# Patient Record
Sex: Female | Born: 1940
Health system: Southern US, Community
[De-identification: ages and names within clinical notes are randomized; demographics above are authoritative.]

## PROBLEM LIST (undated history)

## (undated) DIAGNOSIS — R4701 Aphasia: Secondary | ICD-10-CM

## (undated) DIAGNOSIS — D890 Polyclonal hypergammaglobulinemia: Secondary | ICD-10-CM

## (undated) DIAGNOSIS — J3081 Allergic rhinitis due to animal (cat) (dog) hair and dander: Secondary | ICD-10-CM

## (undated) DIAGNOSIS — F419 Anxiety disorder, unspecified: Secondary | ICD-10-CM

## (undated) DIAGNOSIS — I679 Cerebrovascular disease, unspecified: Secondary | ICD-10-CM

## (undated) DIAGNOSIS — I639 Cerebral infarction, unspecified: Secondary | ICD-10-CM

## (undated) DIAGNOSIS — I6529 Occlusion and stenosis of unspecified carotid artery: Secondary | ICD-10-CM

## (undated) DIAGNOSIS — I10 Essential (primary) hypertension: Secondary | ICD-10-CM

## (undated) DIAGNOSIS — E782 Mixed hyperlipidemia: Secondary | ICD-10-CM

## (undated) DIAGNOSIS — F32A Depression, unspecified: Secondary | ICD-10-CM

## (undated) DIAGNOSIS — F329 Major depressive disorder, single episode, unspecified: Secondary | ICD-10-CM

## (undated) DIAGNOSIS — N183 Chronic kidney disease, stage 3 (moderate): Secondary | ICD-10-CM

## (undated) DIAGNOSIS — E119 Type 2 diabetes mellitus without complications: Secondary | ICD-10-CM

## (undated) DIAGNOSIS — N200 Calculus of kidney: Secondary | ICD-10-CM

## (undated) DIAGNOSIS — I739 Peripheral vascular disease, unspecified: Secondary | ICD-10-CM

## (undated) DIAGNOSIS — I2510 Atherosclerotic heart disease of native coronary artery without angina pectoris: Secondary | ICD-10-CM

## (undated) HISTORY — PX: KIDNEY STONE SURGERY: SHX686

## (undated) HISTORY — DX: Depression, unspecified: F32.A

## (undated) HISTORY — PX: CHOLECYSTECTOMY: SHX55

## (undated) HISTORY — PX: ABDOMINAL HYSTERECTOMY: SHX81

## (undated) HISTORY — DX: Allergic rhinitis due to animal (cat) (dog) hair and dander: J30.81

## (undated) HISTORY — DX: Aphasia: R47.01

## (undated) HISTORY — DX: Cerebrovascular disease, unspecified: I67.9

## (undated) HISTORY — DX: Peripheral vascular disease, unspecified: I73.9

## (undated) HISTORY — DX: Calculus of kidney: N20.0

## (undated) HISTORY — DX: Atherosclerotic heart disease of native coronary artery without angina pectoris: I25.10

## (undated) HISTORY — DX: Major depressive disorder, single episode, unspecified: F32.9

## (undated) HISTORY — DX: Mixed hyperlipidemia: E78.2

## (undated) HISTORY — DX: Cerebral infarction, unspecified: I63.9

## (undated) HISTORY — PX: TUBAL LIGATION: SHX77

## (undated) HISTORY — DX: Essential (primary) hypertension: I10

## (undated) HISTORY — DX: Anxiety disorder, unspecified: F41.9

## (undated) HISTORY — PX: APPENDECTOMY: SHX54

## (undated) HISTORY — PX: OTHER SURGICAL HISTORY: SHX169

## (undated) HISTORY — DX: Polyclonal hypergammaglobulinemia: D89.0

## (undated) HISTORY — DX: Occlusion and stenosis of unspecified carotid artery: I65.29

## (undated) HISTORY — PX: CATARACT EXTRACTION: SUR2

---

## 1998-12-27 ENCOUNTER — Encounter: Payer: Self-pay | Admitting: Neurology

## 1998-12-27 ENCOUNTER — Inpatient Hospital Stay (HOSPITAL_COMMUNITY): Admission: EM | Admit: 1998-12-27 | Discharge: 1999-01-01 | Payer: Self-pay | Admitting: Emergency Medicine

## 1998-12-28 ENCOUNTER — Encounter: Payer: Self-pay | Admitting: Internal Medicine

## 1998-12-29 ENCOUNTER — Encounter: Payer: Self-pay | Admitting: Internal Medicine

## 1998-12-30 ENCOUNTER — Encounter: Payer: Self-pay | Admitting: Internal Medicine

## 2002-01-20 ENCOUNTER — Ambulatory Visit (HOSPITAL_COMMUNITY): Admission: RE | Admit: 2002-01-20 | Discharge: 2002-01-20 | Payer: Self-pay | Admitting: Family Medicine

## 2002-01-20 ENCOUNTER — Encounter: Payer: Self-pay | Admitting: Family Medicine

## 2002-01-24 ENCOUNTER — Ambulatory Visit (HOSPITAL_COMMUNITY): Admission: RE | Admit: 2002-01-24 | Discharge: 2002-01-24 | Payer: Self-pay | Admitting: Family Medicine

## 2002-01-24 ENCOUNTER — Encounter: Payer: Self-pay | Admitting: Family Medicine

## 2002-03-19 ENCOUNTER — Inpatient Hospital Stay (HOSPITAL_COMMUNITY): Admission: EM | Admit: 2002-03-19 | Discharge: 2002-03-25 | Payer: Self-pay | Admitting: *Deleted

## 2002-03-19 ENCOUNTER — Encounter: Payer: Self-pay | Admitting: *Deleted

## 2002-03-21 ENCOUNTER — Encounter: Payer: Self-pay | Admitting: Family Medicine

## 2003-06-27 ENCOUNTER — Inpatient Hospital Stay (HOSPITAL_COMMUNITY): Admission: AD | Admit: 2003-06-27 | Discharge: 2003-07-02 | Payer: Self-pay | Admitting: Family Medicine

## 2003-06-27 ENCOUNTER — Ambulatory Visit (HOSPITAL_COMMUNITY): Admission: RE | Admit: 2003-06-27 | Discharge: 2003-06-27 | Payer: Self-pay | Admitting: Family Medicine

## 2003-06-27 IMAGING — CT CT CHEST W/O CM
1 of 2 series · 15 of 31 positions shown, 19 images · non-contrast
Comparison: none

CLINICAL DATA: Dyspnea.
 CT OF THE CHEST WITHOUT CONTRAST
 Comparison chest radiograph of [DATE].
 Contiguous axial CT images were obtained from the lung apices through the bases.  No contrast was administered.  The patient is allergic to IV contrast. 
 Mediastinum appears unremarkable.  There is consolidation of the left lower lobe likely reflecting pneumonia.  It will be important to reevaluate this in order to make sure there is not a central obstructing process on follow-up exams.  There are some air bronchograms in the left lower lobe.  I do not see a significant shift of heart or mediastinal structures to the left to suggest that this is purely due to atelectasis although there is some volume loss in the left lower lobe.
 There is also some band-like atelectasis in the right middle lobe and in the posterior basal segment of the right lower lobe.  I doubt that there is actually a pleural effusion.  
 IMPRESSION
 1.  Volume loss and consolidation in the left lower lobe.  Cannot exclude a central obstructing process although this may simply be due to pneumonia.  Follow-up chest radiography to ensure clearance is recommended.  If this process does not clear despite appropriate therapy, then bronchoscopy may be warranted with attention to the left lower lobe.
 2.  There is also some subsegmental atelectasis at the right lung base.

[Series 6799: — · axial · 0.52mm/px · z∈[+1465,+1710]mm · 15 of 57 slices shown, 19 images]
[im 4/57  mediastinal]
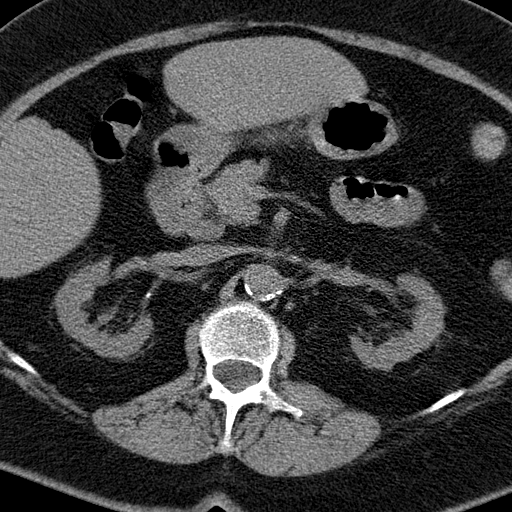
[im 4/57  lung]
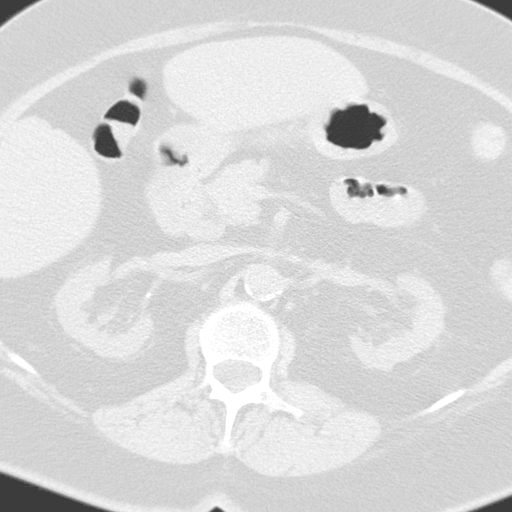
[im 8/57  lung]
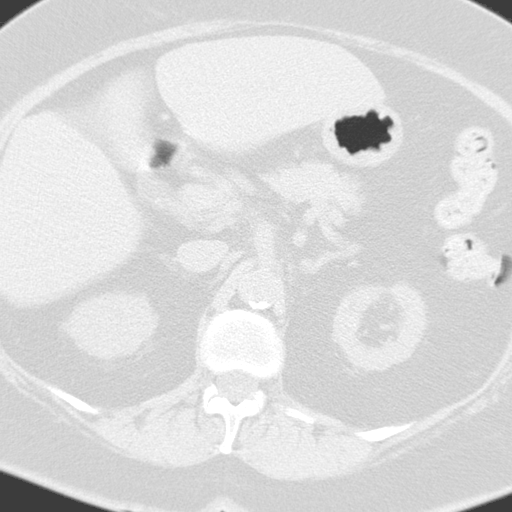
[im 15/57  lung]
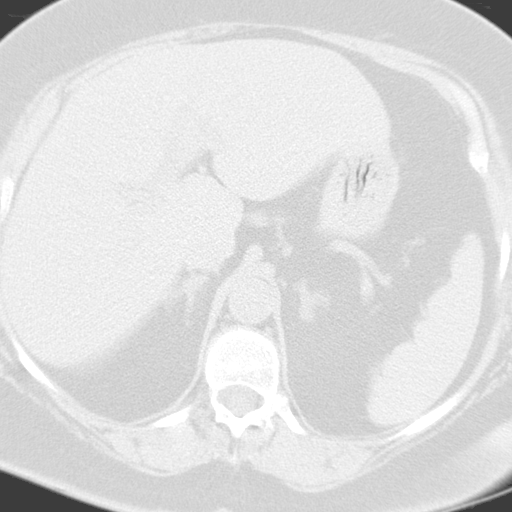
[im 18/57  lung]
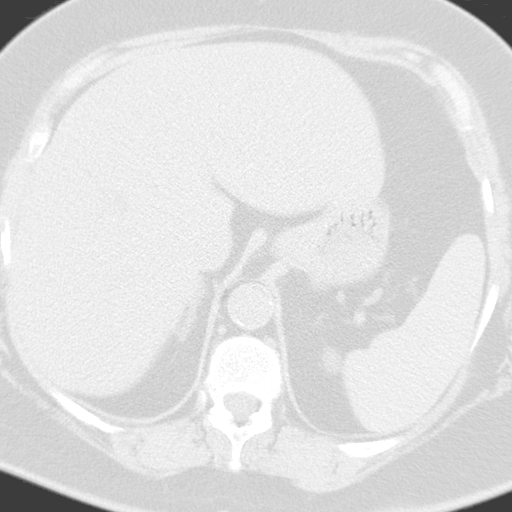
[im 19/57  mediastinal]
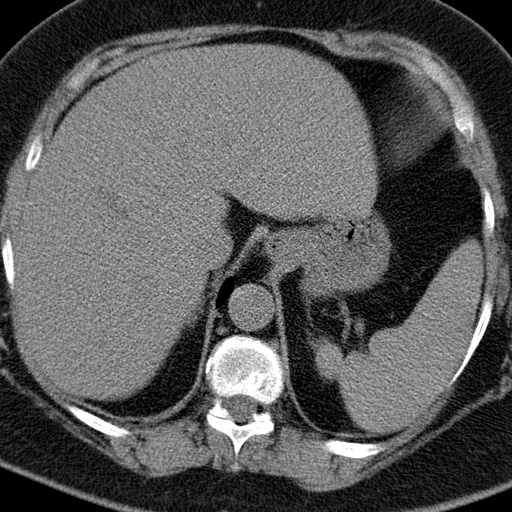
[im 19/57  lung]
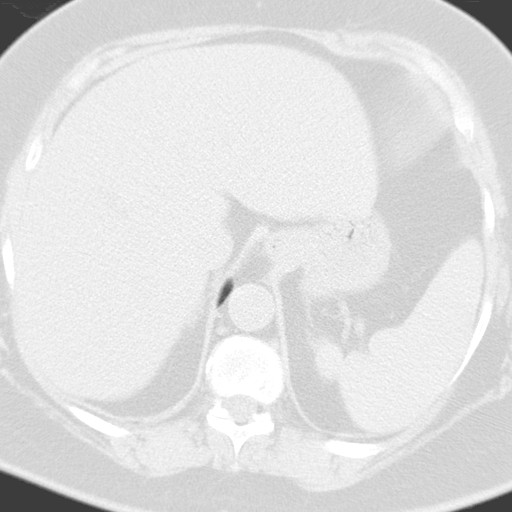
[im 22/57  lung]
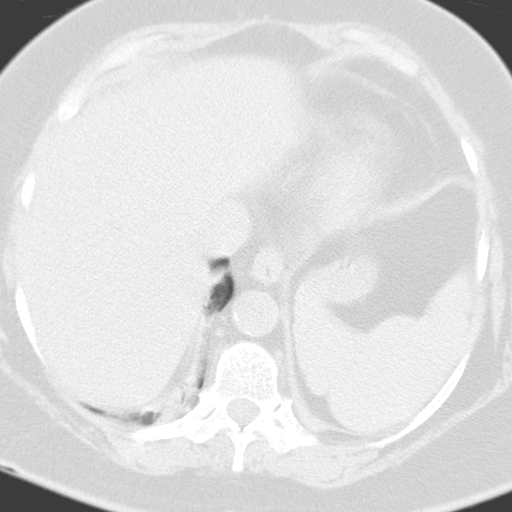
[im 25/57  lung]
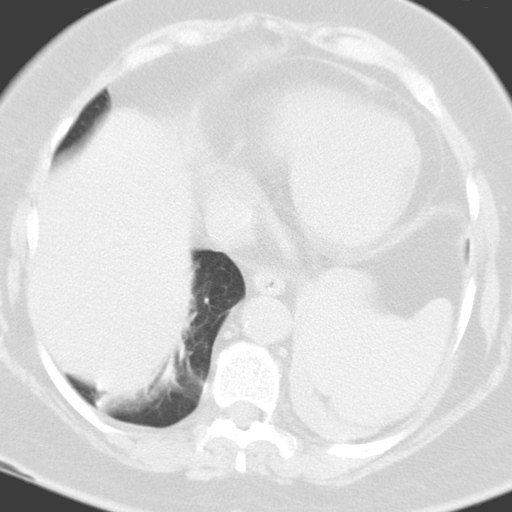
[im 29/57  lung]
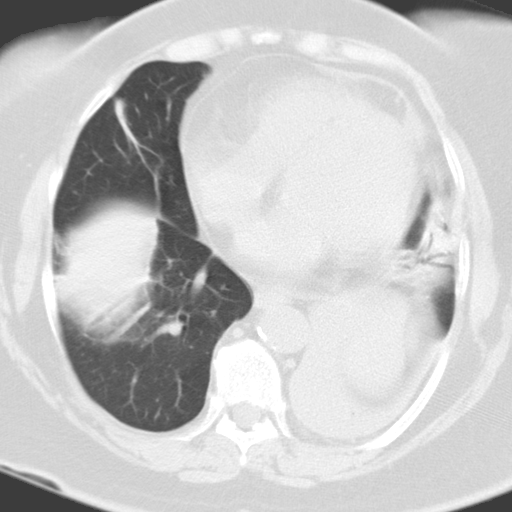
[im 32/57  mediastinal]
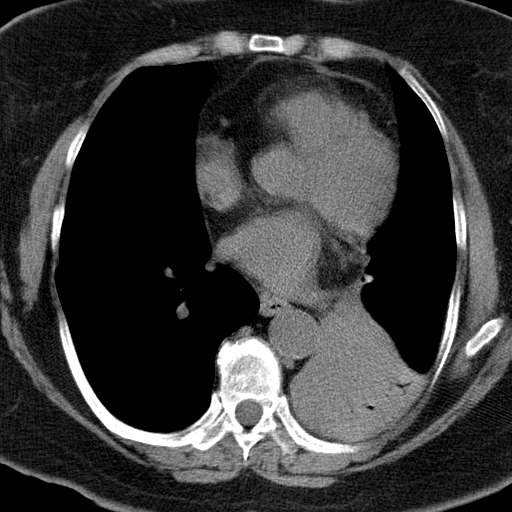
[im 32/57  lung]
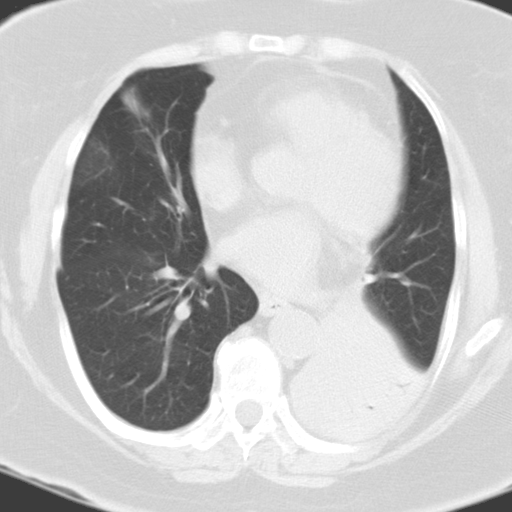
[im 36/57  lung]
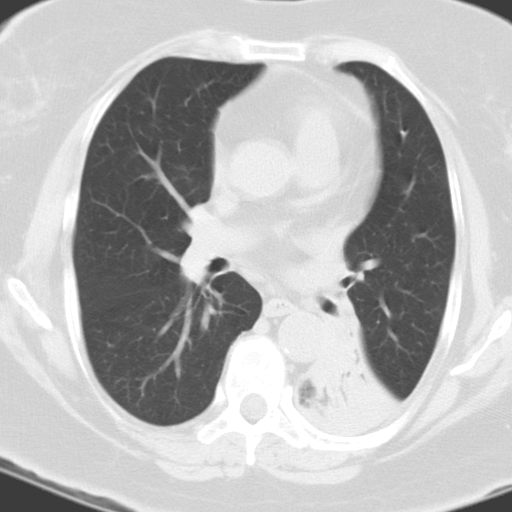
[im 38/57  lung]
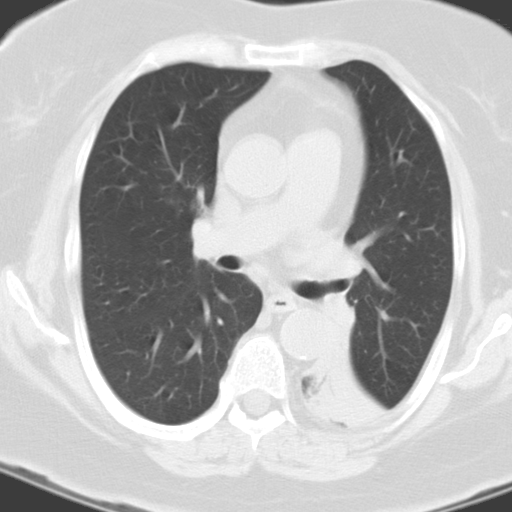
[im 39/57  lung]
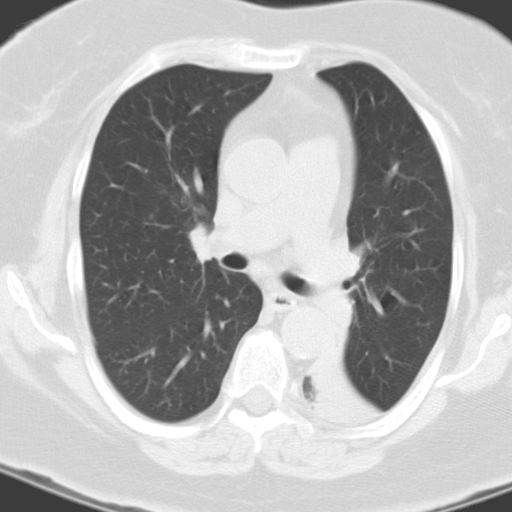
[im 46/57  mediastinal]
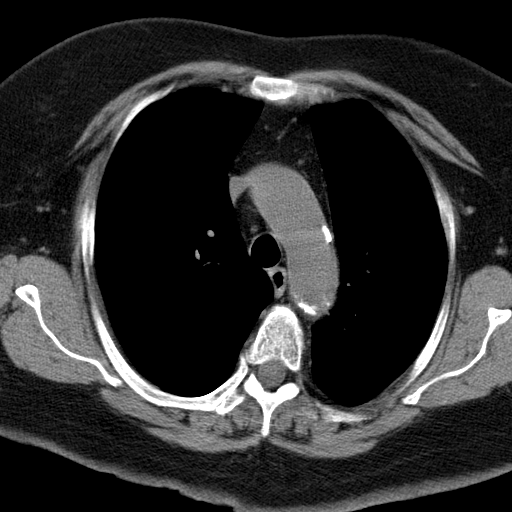
[im 46/57  lung]
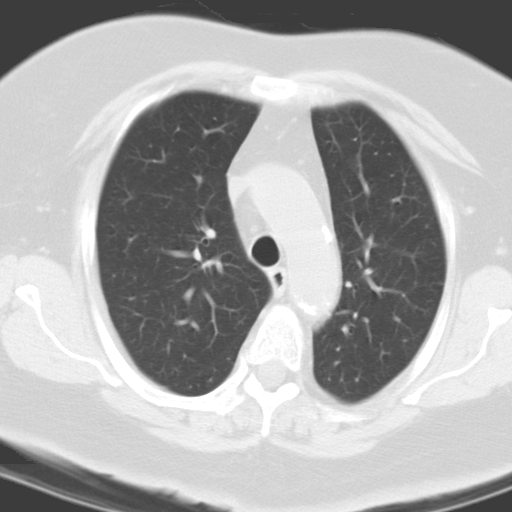
[im 50/57  lung]
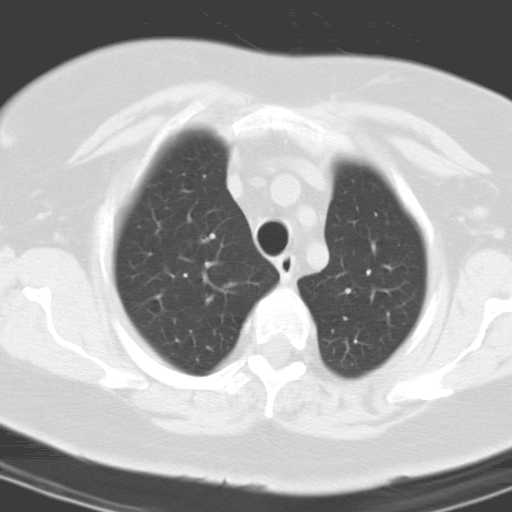
[im 53/57  lung]
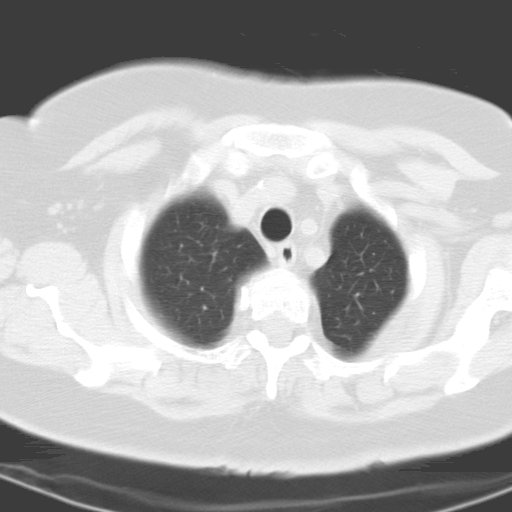

[15 of 31 positions shown; findings below may reference images not displayed]

## 2003-06-27 IMAGING — NM DG CHEST 2V
1 series · 2 of 2 positions shown · non-contrast
Comparison: none

CLINICAL DATA: Shortness of breath; fever
 CHEST TWO VIEWS
 Comparison [DATE].  The heart is mildly enlarged though stable.  The thoracic aorta is mildly atherosclerotic though unchanged.  The hilar and mediastinal contours are otherwise unremarkable.  There is consolidation in the left lower lobe.  Linear atelectasis is present in the right middle and lower lobes.  Degenerative changes are present throughout the thoracic spine.
 IMPRESSION
 Left lower lobe pneumonia.  Linear atelectasis in the right middle and right lower lobe.  Stable mild cardiomegaly.

[Series 1: — · 2 of 2 slices shown]
[im 1/2]
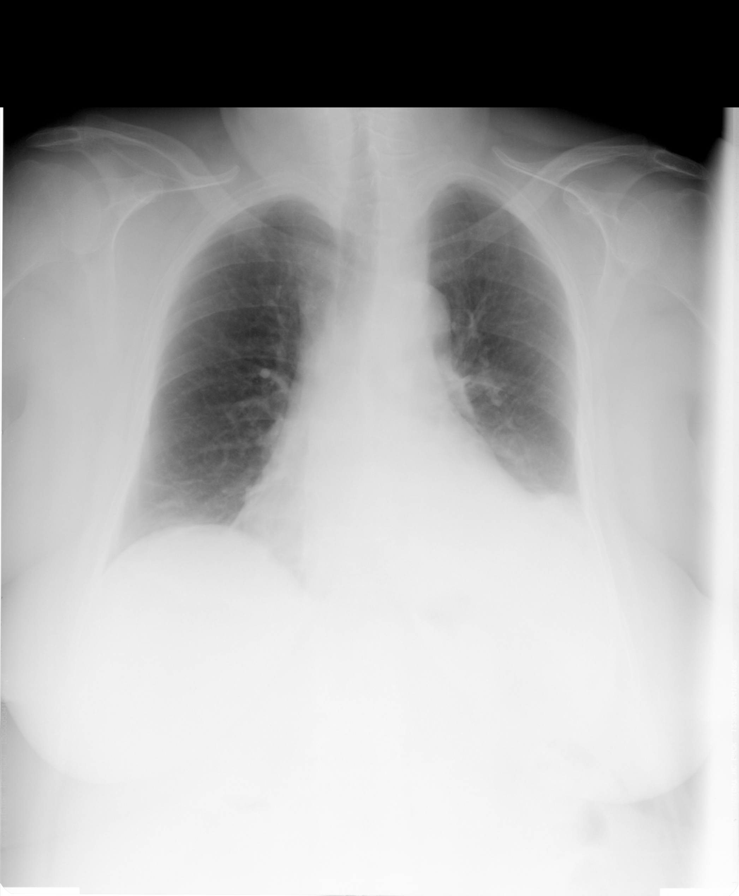
[im 2/2]
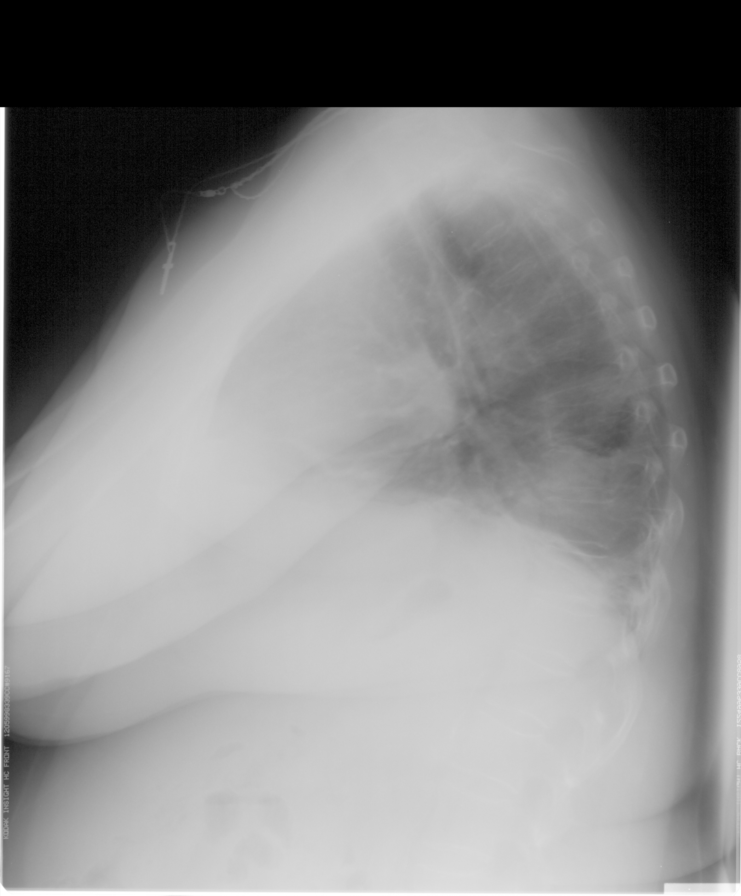

[2 of 2 positions shown; findings below may reference images not displayed]

## 2003-06-30 IMAGING — CR DG CHEST 2V
2 series · 2 of 2 positions shown · non-contrast
Comparison: none

CLINICAL DATA: Dyspnea, pleural effusion, bilateral basilar atelectasis.  Left lower lobe infiltrate.
 CHEST TWO VIEWS
 PA and lateral views of the chest are made on [DATE] at [HJ] hours and are compared to a previous study of [DATE] and previous CT scan of [DATE] and show again areas of atelectasis and/or infiltrate associated with the left base, which have changed little.  There is now slight improvement in aeration of the right lung base.  There remains mild cardiomegaly.  No definite pleural effusion is seen.  The bones show mild osteopenia.  The aorta is minimally elongated, dilated and calcified.  
 IMPRESSION
 Slight improvement in right lower lobe aeration.  There remains significant infiltrate and/or atelectasis at the left base and I would recommend follow-up until complete clearing and if clearing is not seen in three to four weeks, I would recommend a bronchoscopic examination to rule out an obstructing lesion of the left lower lobe bronchus.

[view not recorded (1 of 2)]
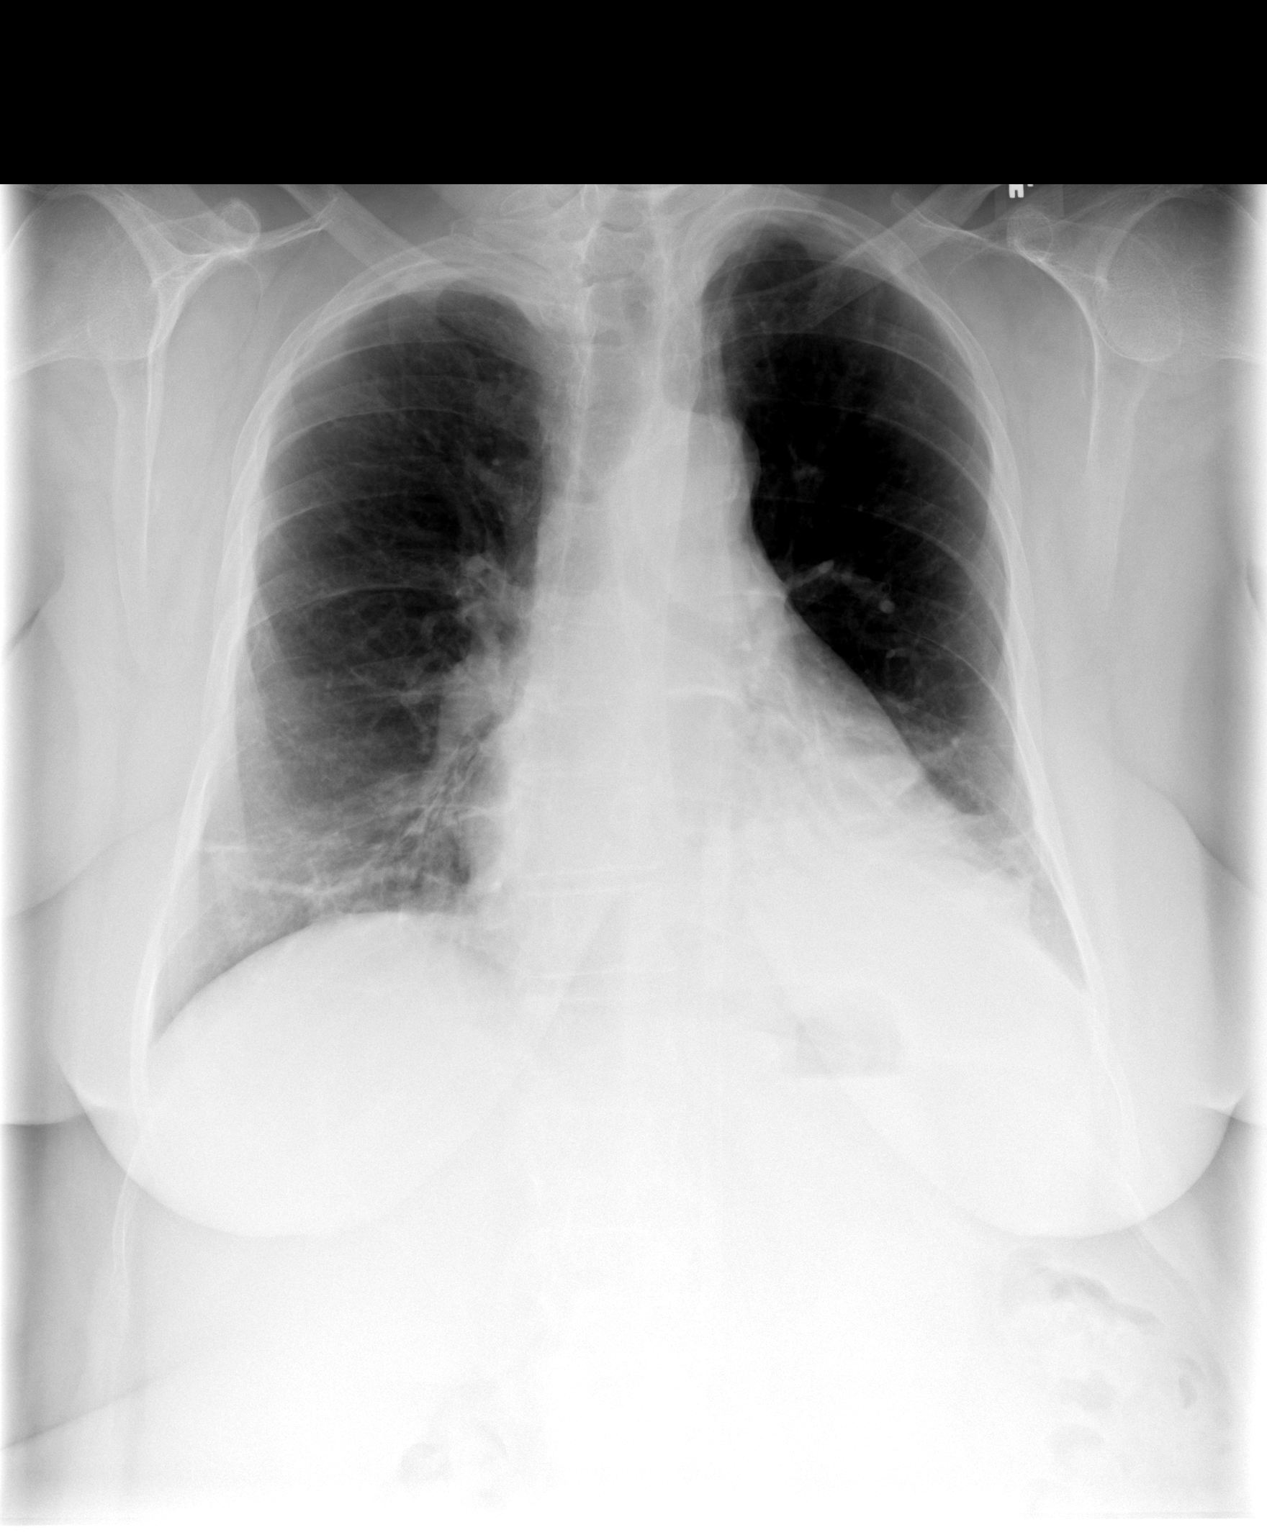

[view not recorded (2 of 2)]
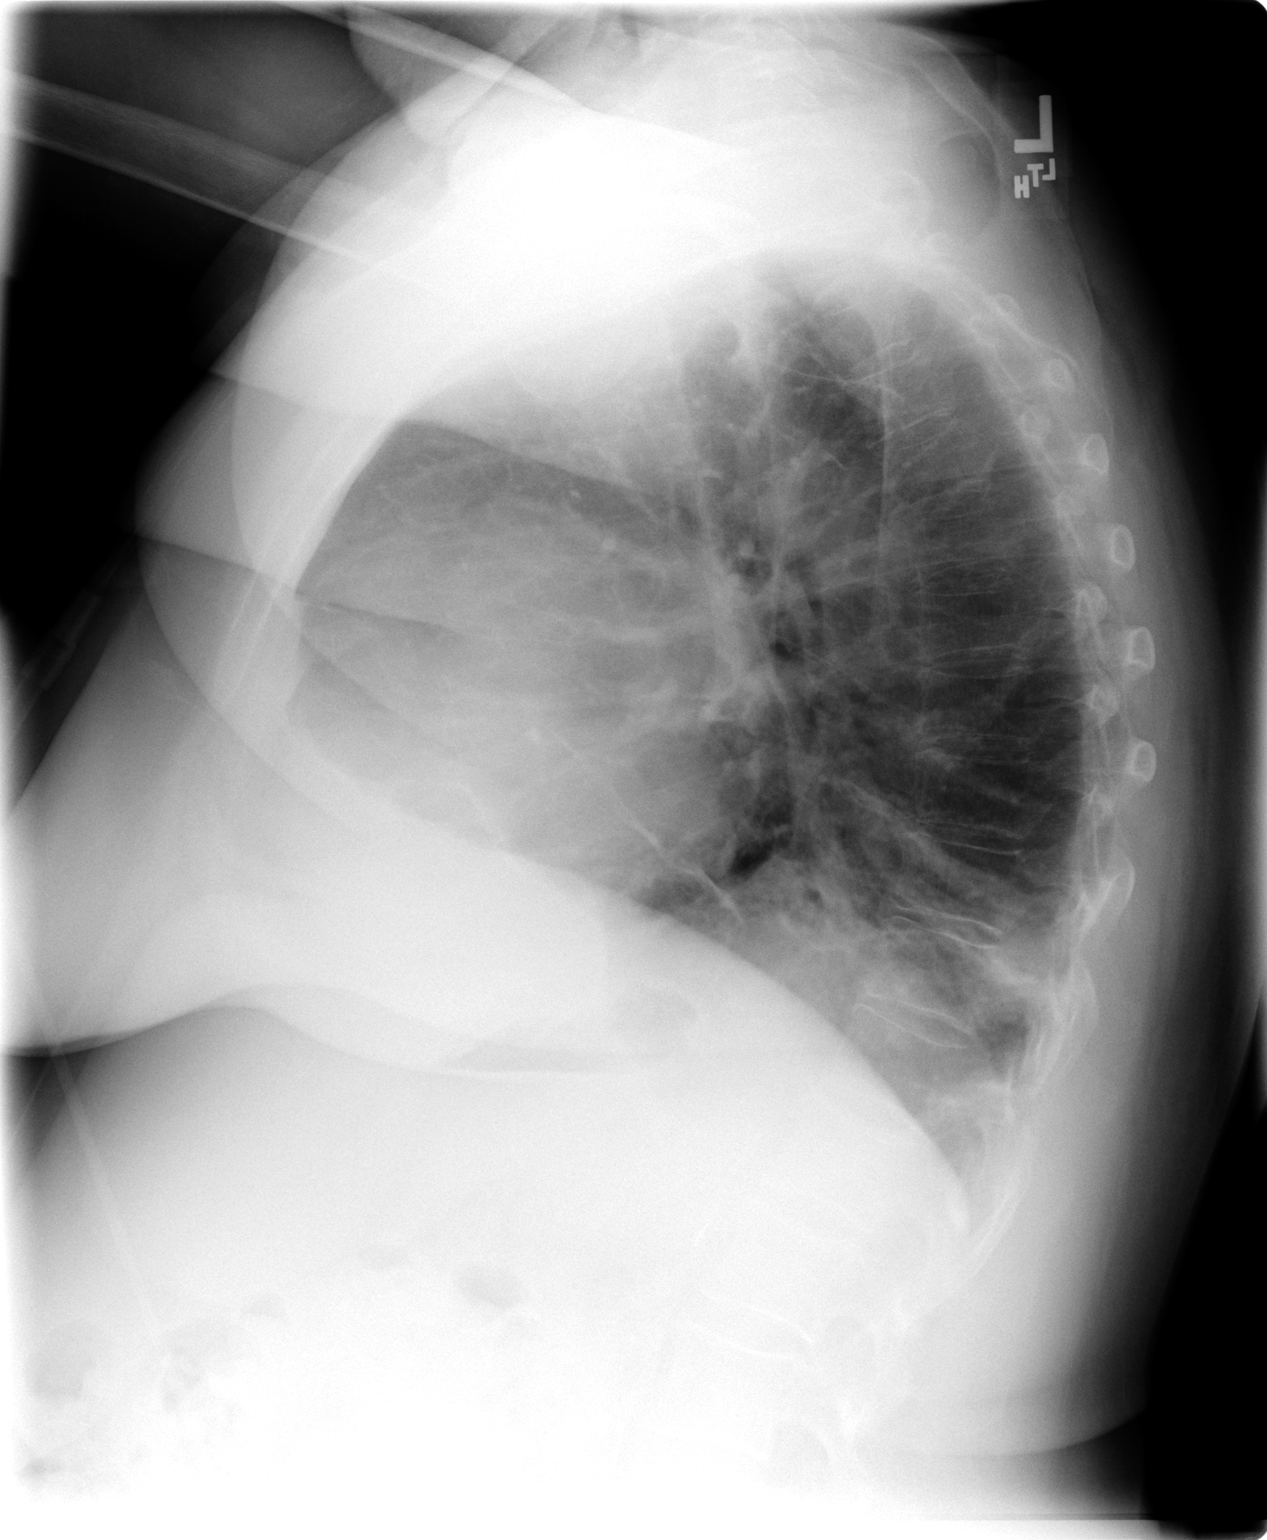

[2 of 2 positions shown; findings below may reference images not displayed]

## 2005-08-30 ENCOUNTER — Inpatient Hospital Stay (HOSPITAL_COMMUNITY): Admission: EM | Admit: 2005-08-30 | Discharge: 2005-09-03 | Payer: Self-pay | Admitting: *Deleted

## 2005-08-30 ENCOUNTER — Ambulatory Visit: Payer: Self-pay | Admitting: Internal Medicine

## 2005-08-30 IMAGING — CR DG CHEST 1V PORT
1 series · 1 of 1 positions shown · non-contrast
Comparison: [DATE].

CLINICAL DATA: Chest pain. 
 PORTABLE CHEST ? 1 VIEW ? [DATE] ([R5] HOURS):

[view not recorded]
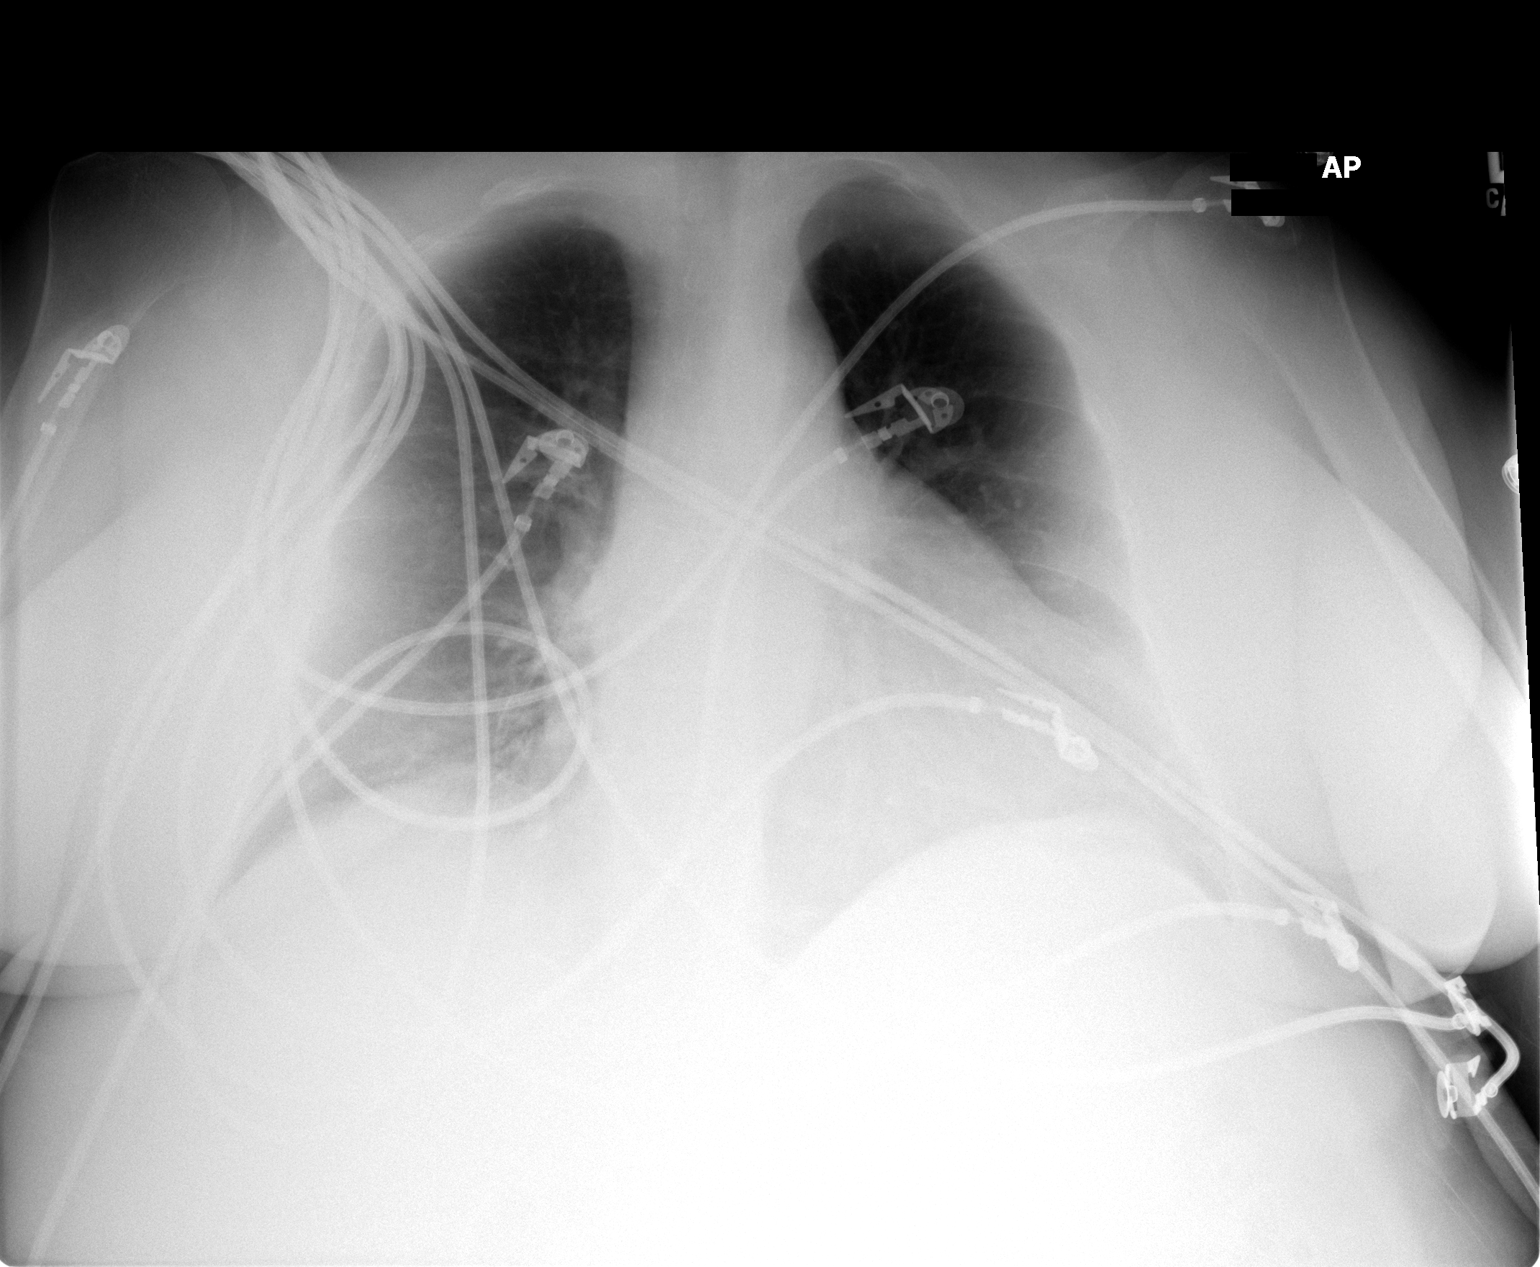

[1 of 1 positions shown; findings below may reference images not displayed]

Artifact overlies the chest.  The heart is at the upper limits of normal in size.     The vascularity is normal.   There is mild scarring at the lung bases.   No effusions.
IMPRESSION: No active disease.

## 2005-09-01 ENCOUNTER — Encounter: Payer: Self-pay | Admitting: Internal Medicine

## 2005-09-16 ENCOUNTER — Ambulatory Visit: Payer: Self-pay | Admitting: Cardiology

## 2005-10-17 ENCOUNTER — Ambulatory Visit: Payer: Self-pay | Admitting: Cardiology

## 2005-10-24 ENCOUNTER — Ambulatory Visit: Payer: Self-pay | Admitting: Cardiology

## 2005-10-28 ENCOUNTER — Ambulatory Visit: Payer: Self-pay

## 2005-11-12 ENCOUNTER — Ambulatory Visit (HOSPITAL_COMMUNITY): Admission: RE | Admit: 2005-11-12 | Discharge: 2005-11-12 | Payer: Self-pay | Admitting: Family Medicine

## 2005-11-25 ENCOUNTER — Ambulatory Visit: Payer: Self-pay | Admitting: Cardiology

## 2006-04-15 ENCOUNTER — Ambulatory Visit (HOSPITAL_COMMUNITY): Admission: RE | Admit: 2006-04-15 | Discharge: 2006-04-15 | Payer: Self-pay | Admitting: Ophthalmology

## 2006-05-06 ENCOUNTER — Ambulatory Visit: Payer: Self-pay | Admitting: Cardiology

## 2006-05-11 ENCOUNTER — Ambulatory Visit: Payer: Self-pay | Admitting: Cardiology

## 2006-05-11 LAB — CONVERTED CEMR LAB
CO2: 27 meq/L (ref 19–32)
Glomerular Filtration Rate, Af Am: 34 mL/min/{1.73_m2}
Glucose, Bld: 90 mg/dL (ref 70–99)
Potassium: 4.4 meq/L (ref 3.5–5.1)

## 2006-05-14 ENCOUNTER — Ambulatory Visit (HOSPITAL_COMMUNITY): Admission: RE | Admit: 2006-05-14 | Discharge: 2006-05-14 | Payer: Self-pay | Admitting: Ophthalmology

## 2006-11-04 ENCOUNTER — Ambulatory Visit: Payer: Self-pay | Admitting: Cardiology

## 2006-11-04 LAB — CONVERTED CEMR LAB
ALT: 22 units/L (ref 0–35)
Alkaline Phosphatase: 133 units/L — ABNORMAL HIGH (ref 39–117)
BUN: 20 mg/dL (ref 6–23)
CO2: 23 meq/L (ref 19–32)
Calcium: 9.9 mg/dL (ref 8.4–10.5)
Creatinine, Ser: 1.5 mg/dL — ABNORMAL HIGH (ref 0.4–1.2)
GFR calc Af Amer: 44 mL/min
Total Protein: 6.8 g/dL (ref 6.0–8.3)

## 2006-11-17 ENCOUNTER — Ambulatory Visit: Payer: Self-pay

## 2006-12-10 ENCOUNTER — Ambulatory Visit: Payer: Self-pay | Admitting: Cardiovascular Disease

## 2006-12-18 ENCOUNTER — Ambulatory Visit: Payer: Self-pay | Admitting: Cardiology

## 2006-12-29 ENCOUNTER — Ambulatory Visit: Payer: Self-pay | Admitting: Cardiology

## 2006-12-30 ENCOUNTER — Ambulatory Visit: Payer: Self-pay | Admitting: Cardiology

## 2006-12-30 ENCOUNTER — Inpatient Hospital Stay (HOSPITAL_COMMUNITY): Admission: AD | Admit: 2006-12-30 | Discharge: 2007-01-02 | Payer: Self-pay | Admitting: Cardiology

## 2006-12-30 IMAGING — CR DG CHEST 2V
2 series · 2 of 2 positions shown · non-contrast
Comparison: [DATE]

CLINICAL DATA: Chest pain. Dyspnea. Diabetic.

CHEST - 2 VIEW

[view not recorded (1 of 2)]
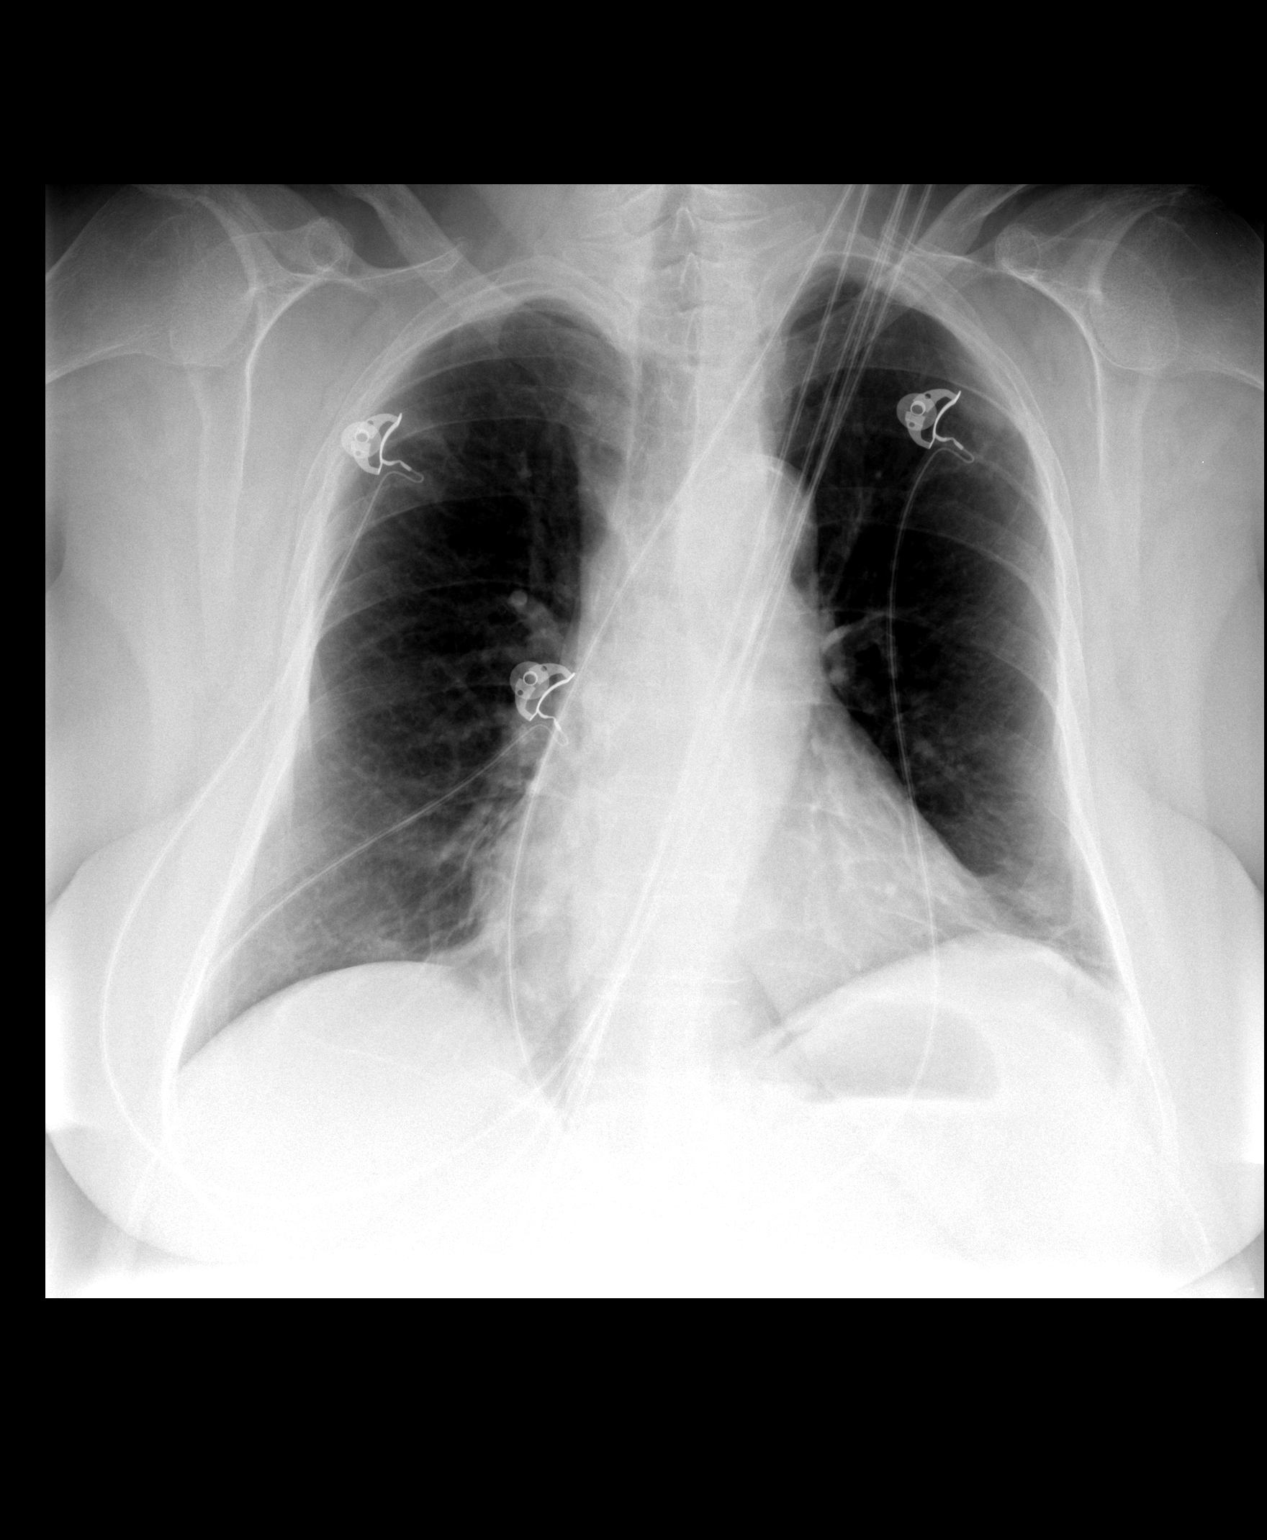

[view not recorded (2 of 2)]
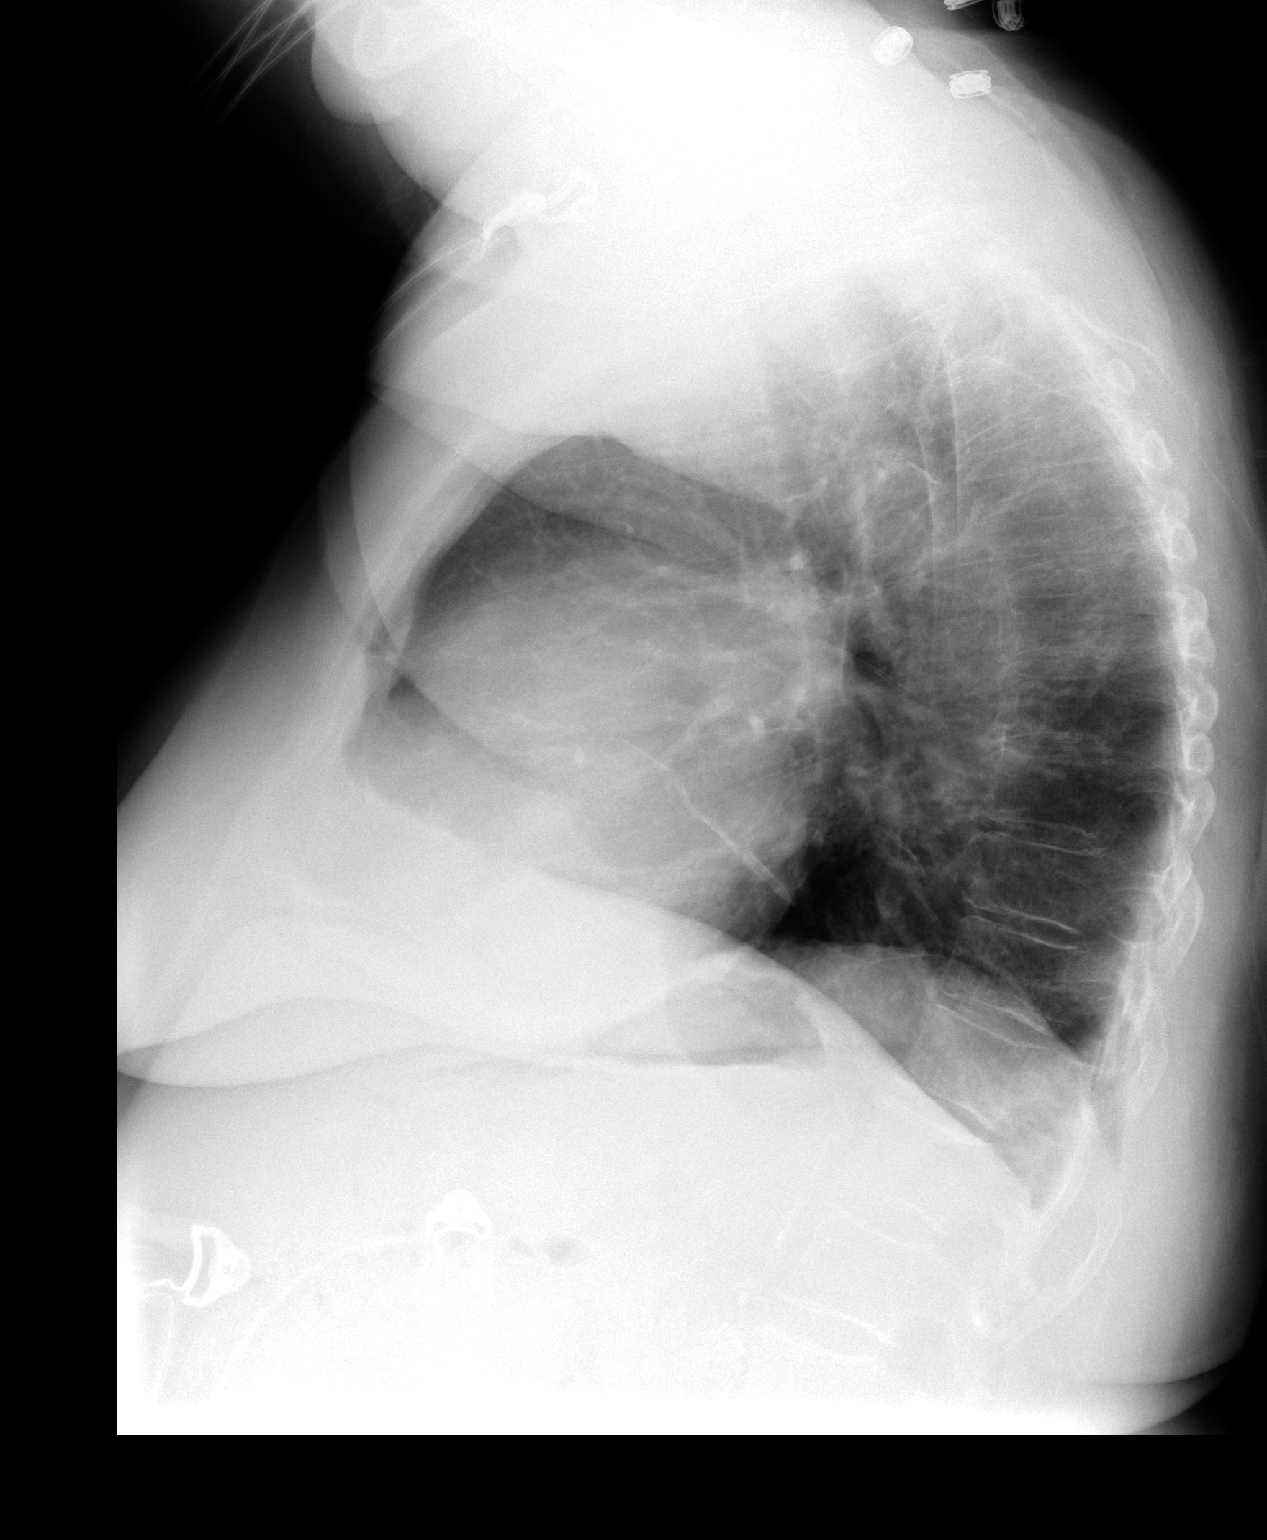

[2 of 2 positions shown; findings below may reference images not displayed]

FINDINGS: Mild osteopenia. Limited evaluation of upper thoracic vertebral
bodies on lateral view.

Midline trachea. Borderline cardiomegaly. Tortuous descending thoracic aorta.
Sharp costophrenic angles. No pneumothorax or congestive failure. Increased
density adjacent to EKG/telemetry leads are felt to be artifactual and part of
the lead apparatus. Minimal left base scar or atelectasis.

IMPRESSION

1. Borderline cardiomegaly without acute cardiopulmonary disease.
2. Left base scar or atelectasis.

## 2007-01-04 ENCOUNTER — Ambulatory Visit: Payer: Self-pay | Admitting: Cardiology

## 2007-01-04 LAB — CONVERTED CEMR LAB
CO2: 29 meq/L (ref 19–32)
Calcium: 9.1 mg/dL (ref 8.4–10.5)
GFR calc Af Amer: 48 mL/min
Glucose, Bld: 134 mg/dL — ABNORMAL HIGH (ref 70–99)

## 2007-02-09 ENCOUNTER — Ambulatory Visit: Payer: Self-pay | Admitting: Cardiology

## 2007-02-09 LAB — CONVERTED CEMR LAB
CO2: 27 meq/L (ref 19–32)
Chloride: 108 meq/L (ref 96–112)
Creatinine, Ser: 1.4 mg/dL — ABNORMAL HIGH (ref 0.4–1.2)
GFR calc non Af Amer: 40 mL/min
Glucose, Bld: 162 mg/dL — ABNORMAL HIGH (ref 70–99)
Sodium: 144 meq/L (ref 135–145)

## 2007-09-08 ENCOUNTER — Ambulatory Visit: Payer: Self-pay | Admitting: Cardiology

## 2007-09-08 LAB — CONVERTED CEMR LAB
AST: 19 units/L (ref 0–37)
Alkaline Phosphatase: 132 units/L — ABNORMAL HIGH (ref 39–117)
Bilirubin, Direct: 0.1 mg/dL (ref 0.0–0.3)
Chloride: 108 meq/L (ref 96–112)
GFR calc non Af Amer: 37 mL/min
Potassium: 4.3 meq/L (ref 3.5–5.1)
Sodium: 142 meq/L (ref 135–145)
Total Bilirubin: 1 mg/dL (ref 0.3–1.2)
Total CHOL/HDL Ratio: 3.9
VLDL: 53 mg/dL — ABNORMAL HIGH (ref 0–40)

## 2007-10-20 ENCOUNTER — Ambulatory Visit: Payer: Self-pay

## 2008-03-14 ENCOUNTER — Ambulatory Visit: Payer: Self-pay | Admitting: Cardiology

## 2008-03-14 LAB — CONVERTED CEMR LAB
Bilirubin, Direct: 0.1 mg/dL (ref 0.0–0.3)
Calcium: 9.6 mg/dL (ref 8.4–10.5)
Creatinine, Ser: 1.5 mg/dL — ABNORMAL HIGH (ref 0.4–1.2)
GFR calc Af Amer: 44 mL/min
HDL: 57.6 mg/dL (ref >39.0)
Sodium: 142 meq/L (ref 135–145)
Total Bilirubin: 0.9 mg/dL (ref 0.3–1.2)
Total CHOL/HDL Ratio: 2.7
Total Protein: 6.8 g/dL (ref 6.0–8.3)
Triglycerides: 170 mg/dL — ABNORMAL HIGH (ref 0–149)
VLDL: 34 mg/dL (ref 0–40)

## 2008-08-15 ENCOUNTER — Encounter: Payer: Self-pay | Admitting: Cardiology

## 2008-08-18 ENCOUNTER — Encounter (INDEPENDENT_AMBULATORY_CARE_PROVIDER_SITE_OTHER): Payer: Self-pay | Admitting: *Deleted

## 2008-08-27 ENCOUNTER — Emergency Department (HOSPITAL_COMMUNITY): Admission: EM | Admit: 2008-08-27 | Discharge: 2008-08-27 | Payer: Self-pay | Admitting: Emergency Medicine

## 2008-09-12 ENCOUNTER — Ambulatory Visit: Payer: Self-pay | Admitting: Internal Medicine

## 2008-09-12 DIAGNOSIS — K5900 Constipation, unspecified: Secondary | ICD-10-CM | POA: Insufficient documentation

## 2008-09-12 DIAGNOSIS — K649 Unspecified hemorrhoids: Secondary | ICD-10-CM | POA: Insufficient documentation

## 2008-09-12 DIAGNOSIS — K921 Melena: Secondary | ICD-10-CM | POA: Insufficient documentation

## 2008-09-13 ENCOUNTER — Encounter: Payer: Self-pay | Admitting: Internal Medicine

## 2008-09-19 DIAGNOSIS — I2510 Atherosclerotic heart disease of native coronary artery without angina pectoris: Secondary | ICD-10-CM | POA: Insufficient documentation

## 2008-09-19 DIAGNOSIS — I10 Essential (primary) hypertension: Secondary | ICD-10-CM | POA: Insufficient documentation

## 2008-09-19 DIAGNOSIS — E119 Type 2 diabetes mellitus without complications: Secondary | ICD-10-CM | POA: Insufficient documentation

## 2008-09-19 DIAGNOSIS — N259 Disorder resulting from impaired renal tubular function, unspecified: Secondary | ICD-10-CM | POA: Insufficient documentation

## 2008-09-19 DIAGNOSIS — I739 Peripheral vascular disease, unspecified: Secondary | ICD-10-CM | POA: Insufficient documentation

## 2008-09-19 DIAGNOSIS — E785 Hyperlipidemia, unspecified: Secondary | ICD-10-CM | POA: Insufficient documentation

## 2008-09-19 DIAGNOSIS — Z91041 Radiographic dye allergy status: Secondary | ICD-10-CM | POA: Insufficient documentation

## 2008-09-20 ENCOUNTER — Ambulatory Visit: Payer: Self-pay | Admitting: Cardiology

## 2008-09-20 ENCOUNTER — Ambulatory Visit: Payer: Self-pay

## 2008-09-20 DIAGNOSIS — R079 Chest pain, unspecified: Secondary | ICD-10-CM | POA: Insufficient documentation

## 2008-09-20 DIAGNOSIS — I6529 Occlusion and stenosis of unspecified carotid artery: Secondary | ICD-10-CM | POA: Insufficient documentation

## 2008-09-26 ENCOUNTER — Ambulatory Visit (HOSPITAL_COMMUNITY): Admission: RE | Admit: 2008-09-26 | Discharge: 2008-09-26 | Payer: Self-pay | Admitting: Internal Medicine

## 2008-09-26 ENCOUNTER — Encounter: Payer: Self-pay | Admitting: Internal Medicine

## 2008-09-26 ENCOUNTER — Ambulatory Visit: Payer: Self-pay | Admitting: Internal Medicine

## 2008-09-26 HISTORY — PX: COLONOSCOPY: SHX174

## 2008-09-27 ENCOUNTER — Encounter: Payer: Self-pay | Admitting: Internal Medicine

## 2008-09-27 ENCOUNTER — Encounter: Payer: Self-pay | Admitting: Cardiology

## 2009-03-19 ENCOUNTER — Emergency Department (HOSPITAL_COMMUNITY): Admission: EM | Admit: 2009-03-19 | Discharge: 2009-03-19 | Payer: Self-pay | Admitting: Emergency Medicine

## 2009-03-19 IMAGING — CR DG CHEST 2V
2 series · 2 of 2 positions shown · non-contrast
Comparison: [DATE]

CLINICAL DATA: Shortness of breath, lower extremity swelling,
weakness, former smoker, coronary disease status post MI, COPD

CHEST - 2 VIEW

[view not recorded (1 of 2)]
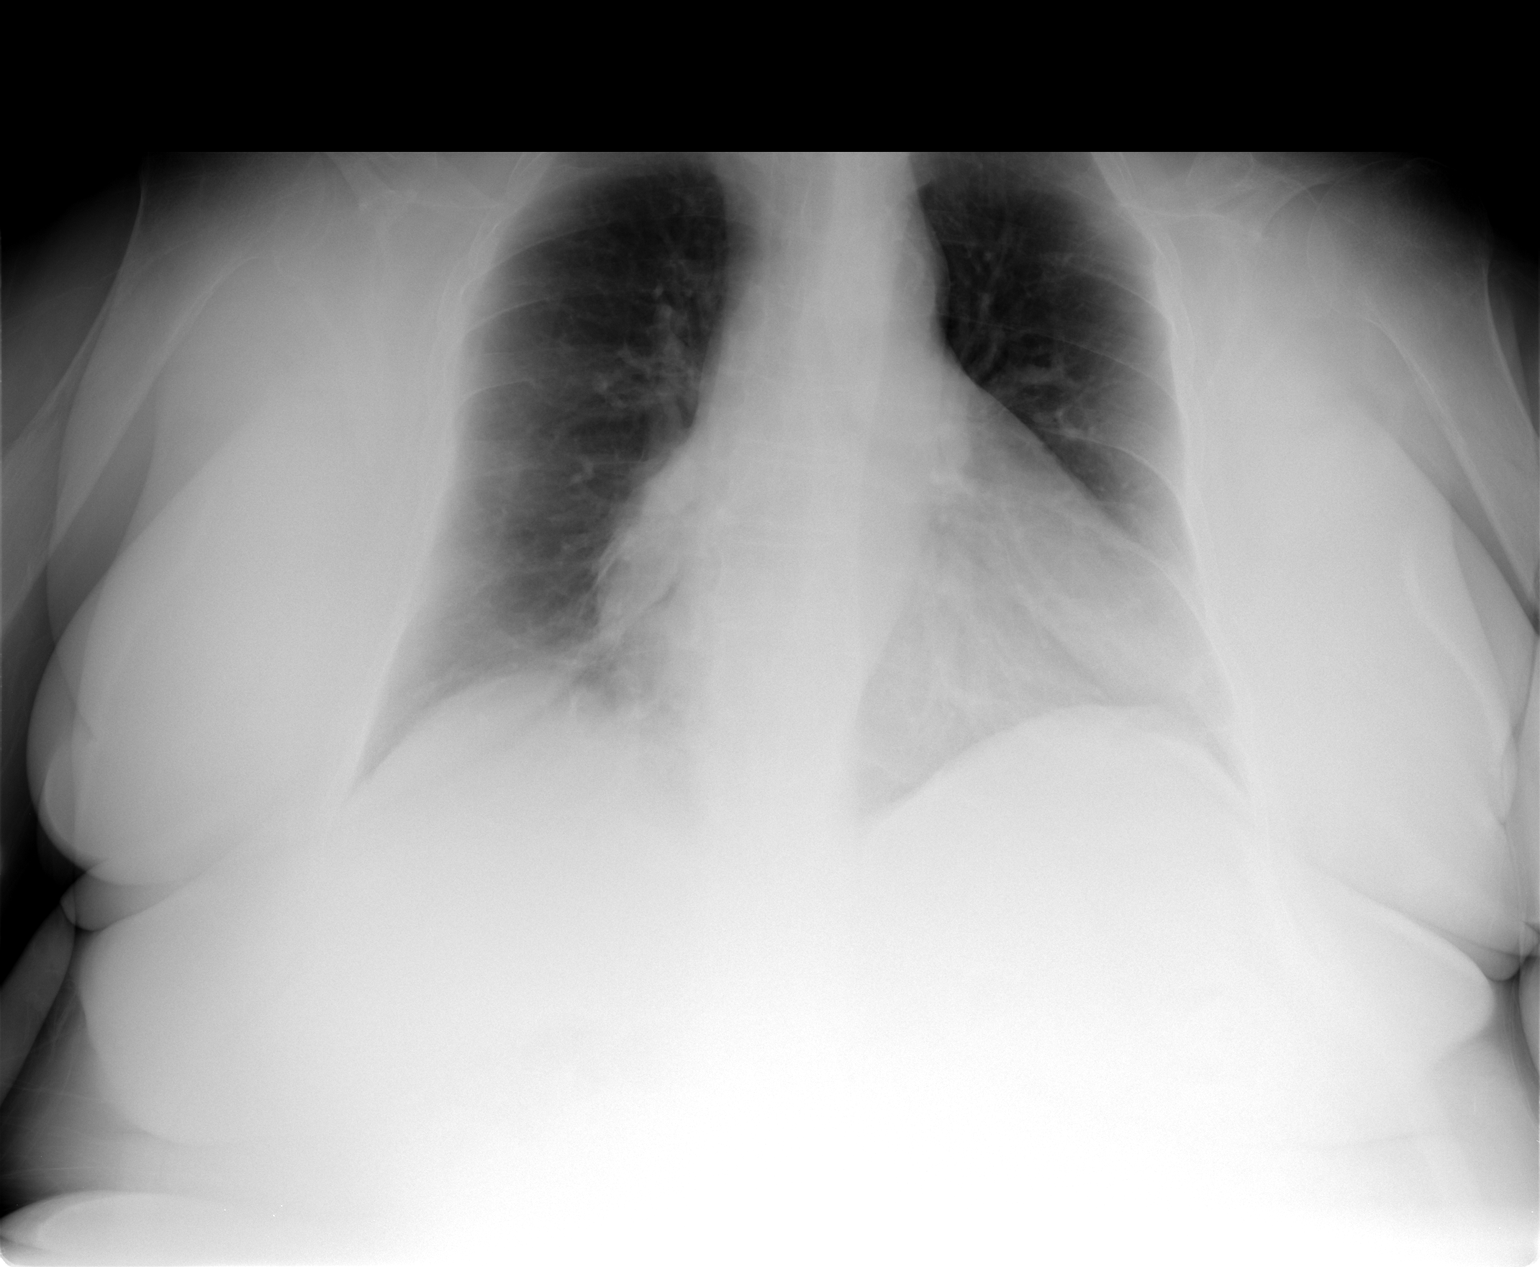

[view not recorded (2 of 2)]
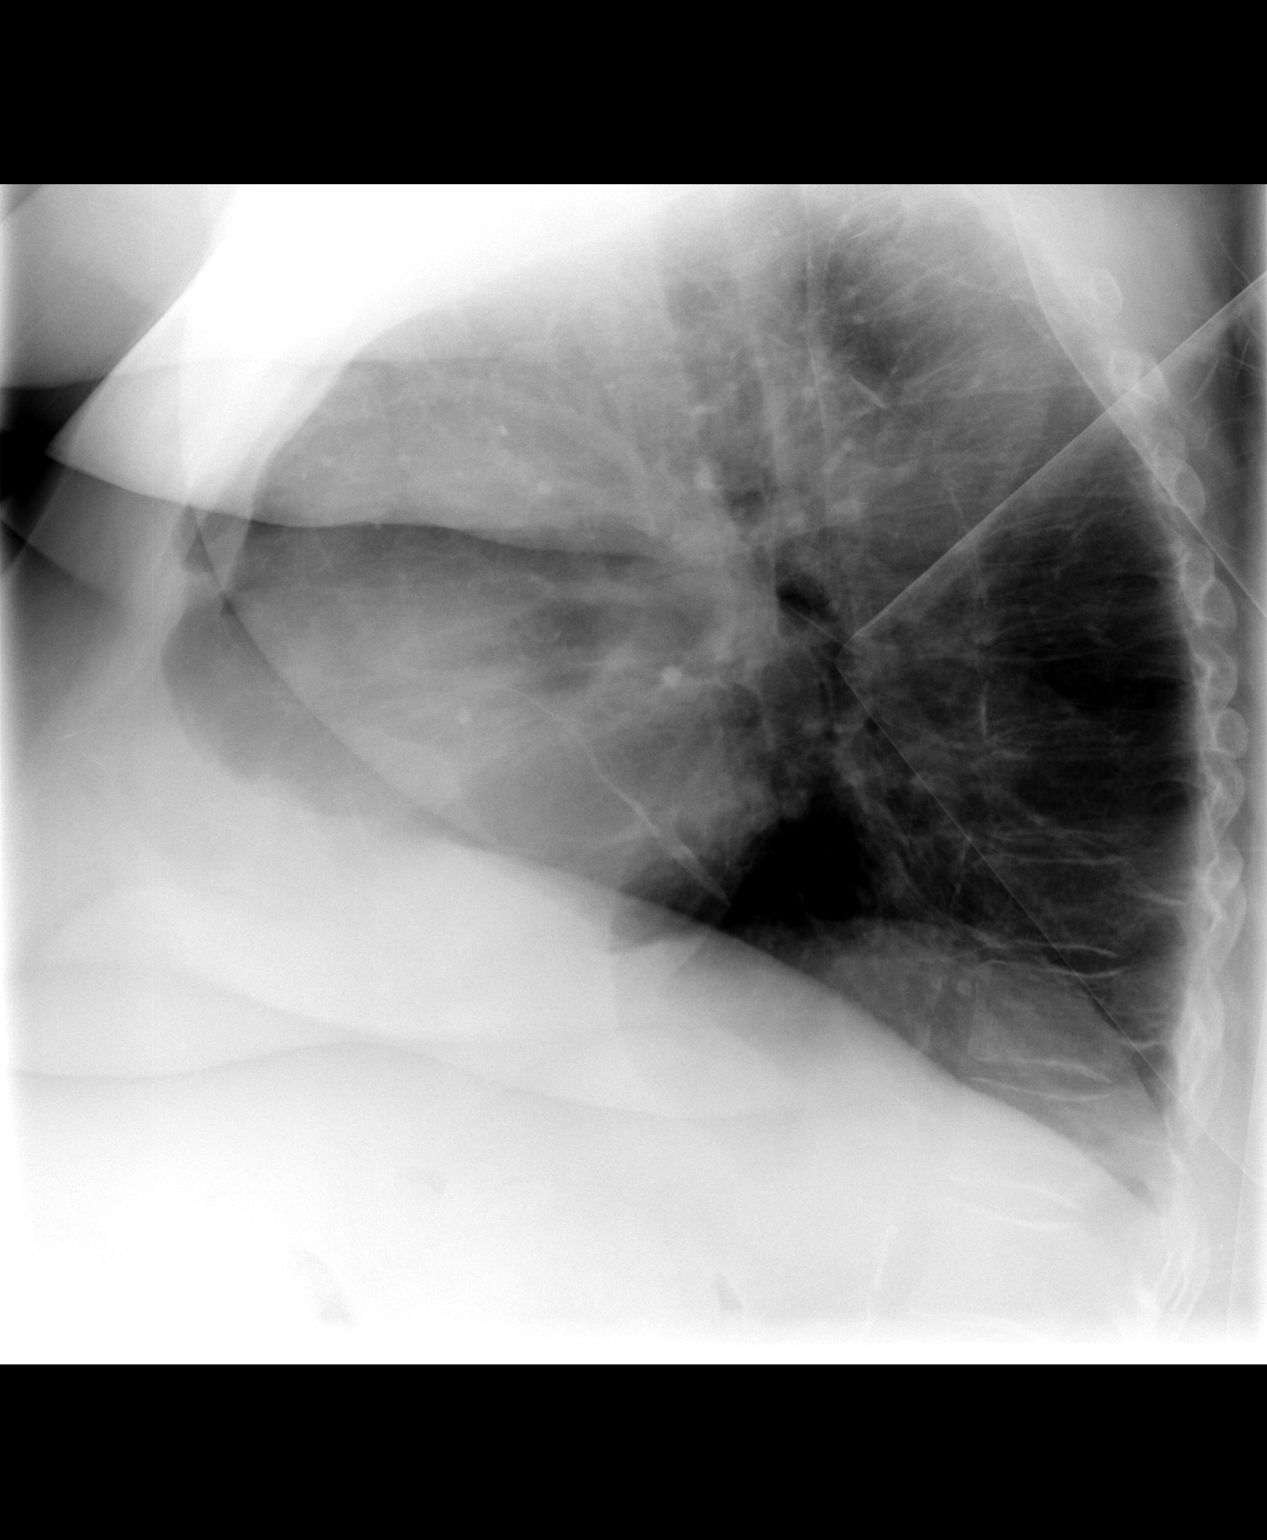

[2 of 2 positions shown; findings below may reference images not displayed]

FINDINGS: Cardiac enlargement.
Calcified tortuous aorta.
Pulmonary vascularity normal.
Lungs clear.
Bones demineralized.
IMPRESSION: Cardiomegaly.
No acute abnormalities.

## 2009-05-02 ENCOUNTER — Ambulatory Visit: Payer: Self-pay | Admitting: Cardiology

## 2009-11-29 ENCOUNTER — Inpatient Hospital Stay (HOSPITAL_COMMUNITY): Admission: EM | Admit: 2009-11-29 | Discharge: 2009-12-03 | Payer: Self-pay | Admitting: Emergency Medicine

## 2009-11-29 IMAGING — CT CT ABD-PELV W/O CM
2 of 4 series · 16 of 46 positions shown, 18 images · non-contrast
Comparison: Lumbar spine radiograph performed [DATE]

CLINICAL DATA: Right-sided abdominal pain; history of renal stones.

CT ABDOMEN AND PELVIS WITHOUT CONTRAST
TECHNIQUE: Multidetector CT imaging of the abdomen and pelvis was
performed following the standard protocol without intravenous
contrast.

[Series 2: standard/full over (age)lbs 5.0 · axial · 0.80mm/px · z∈[-490,-105]mm · 13 of 85 slices shown, 15 images]
[im 4/85  soft-tissue]
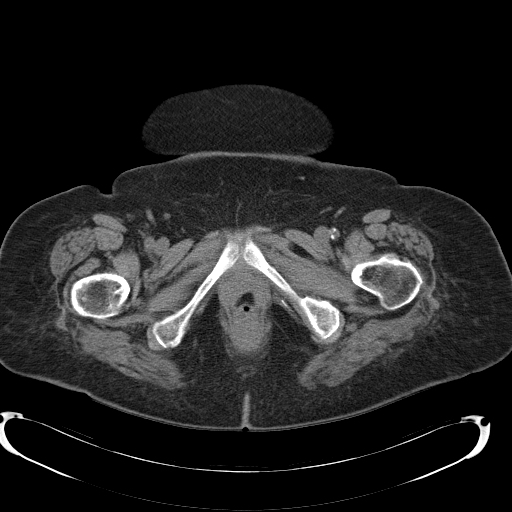
[im 4/85  bone]
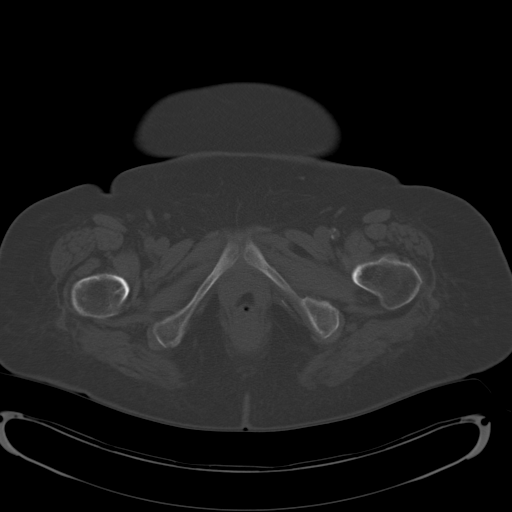
[im 11/85  soft-tissue]
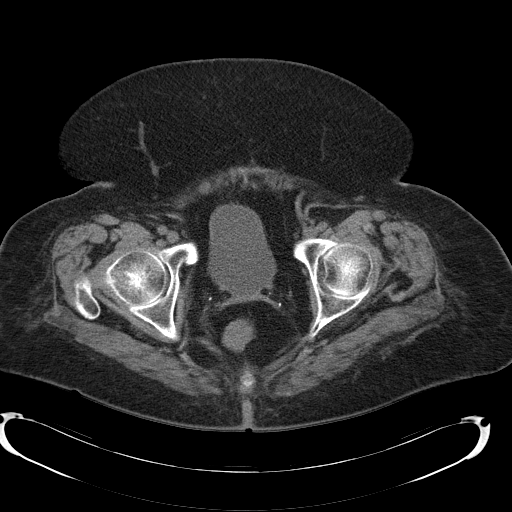
[im 17/85  soft-tissue]
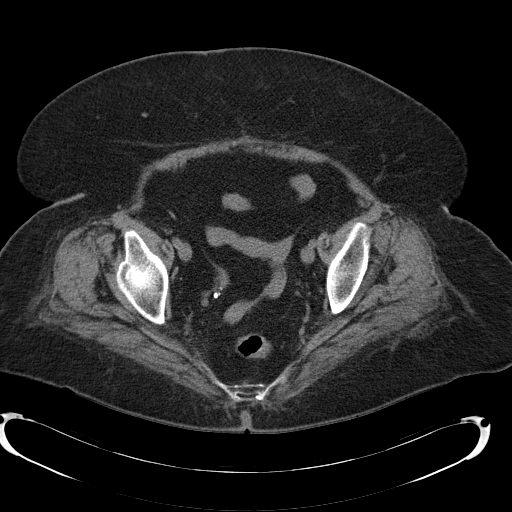
[im 24/85  soft-tissue]
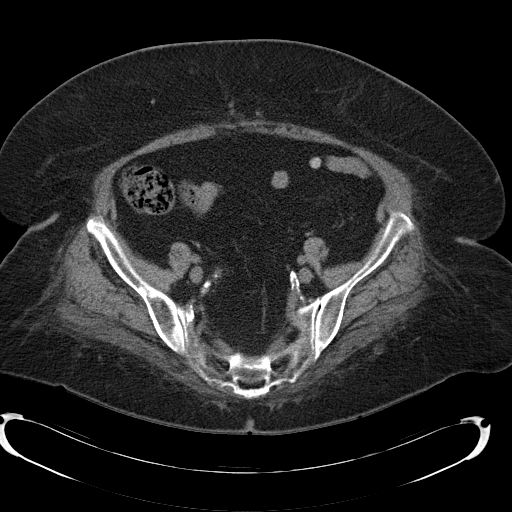
[im 31/85  soft-tissue]
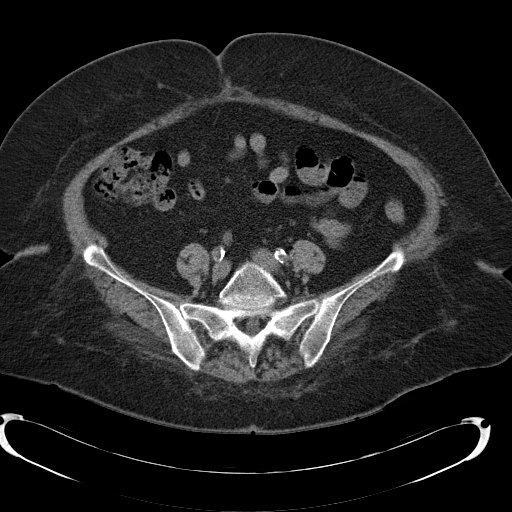
[im 37/85  soft-tissue]
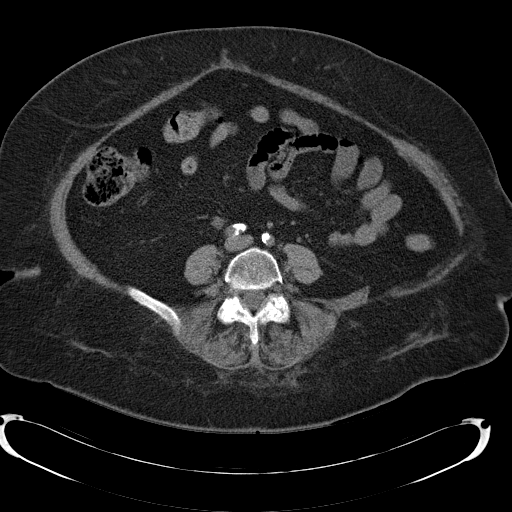
[im 44/85  soft-tissue]
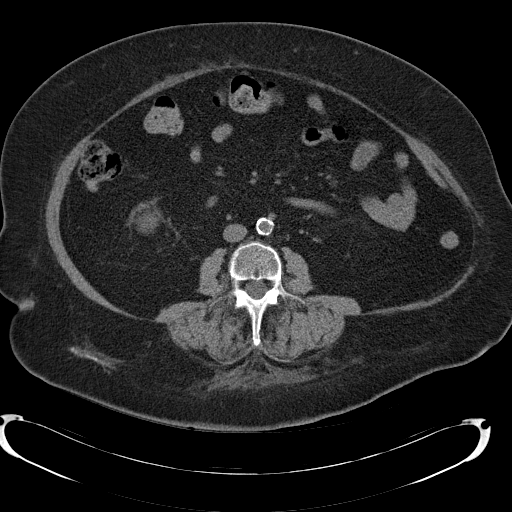
[im 48/85  soft-tissue]
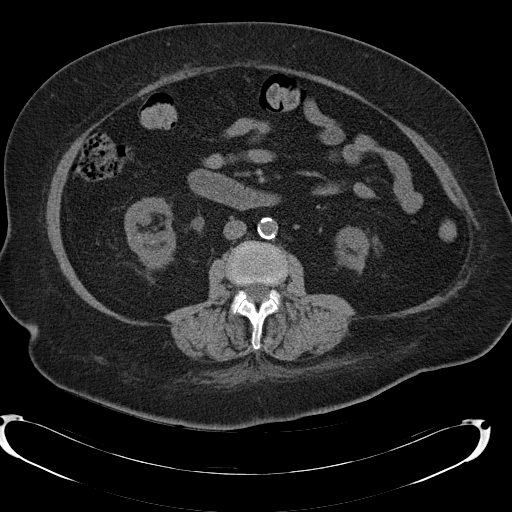
[im 54/85  soft-tissue]
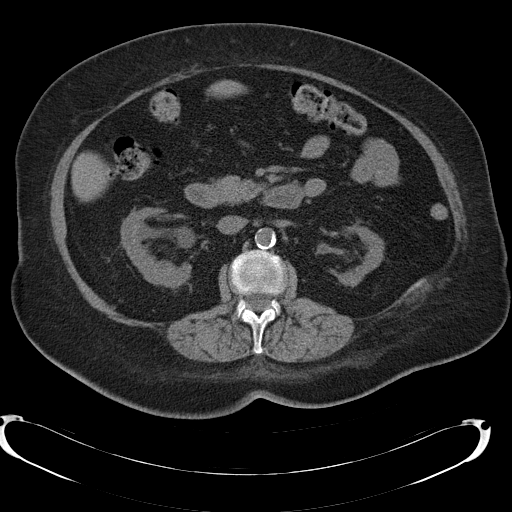
[im 54/85  bone]
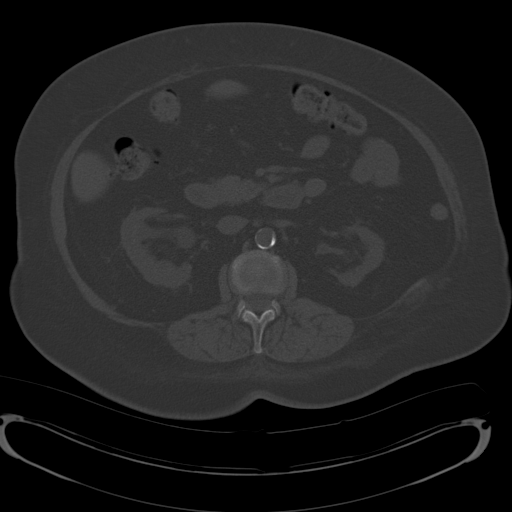
[im 61/85  soft-tissue]
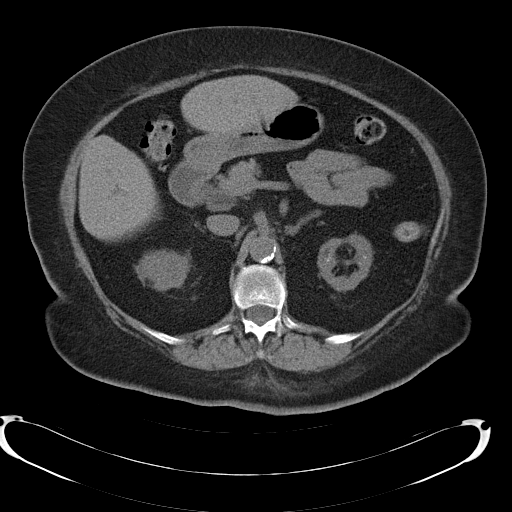
[im 68/85  soft-tissue]
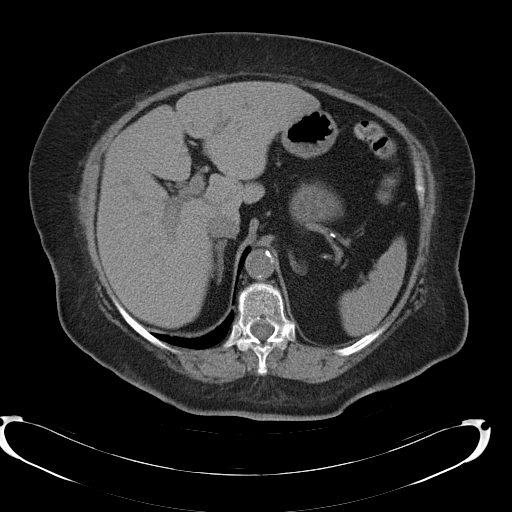
[im 74/85  soft-tissue]
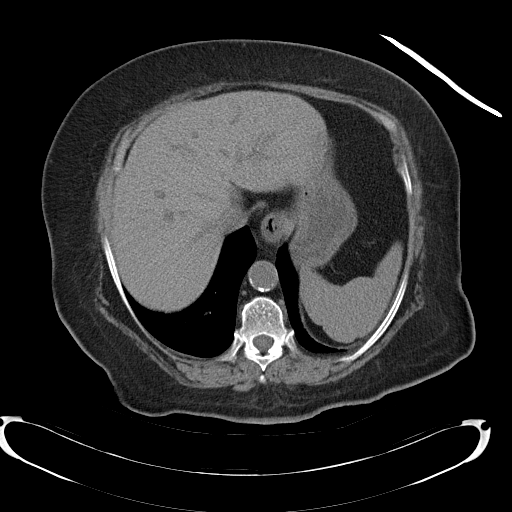
[im 81/85  soft-tissue]
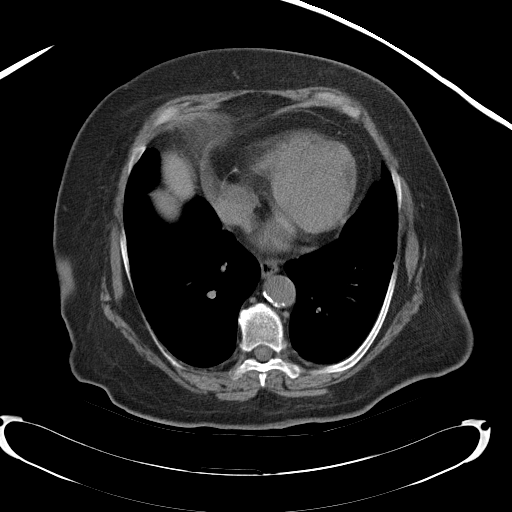

[Series 4: mpr coronal · coronal · 0.83mm/px · 3 of 101 slices shown]
[im 34/101  soft-tissue]
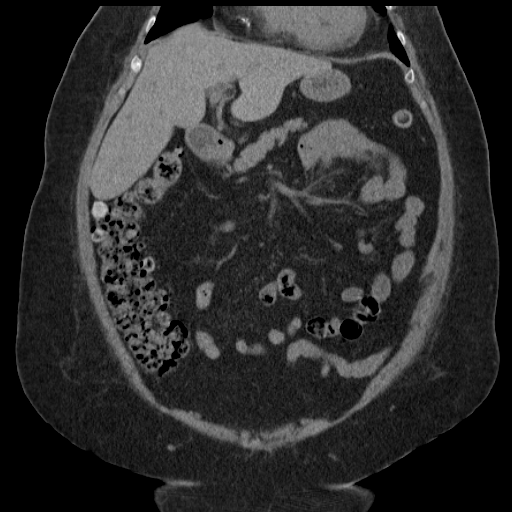
[im 45/101  soft-tissue]
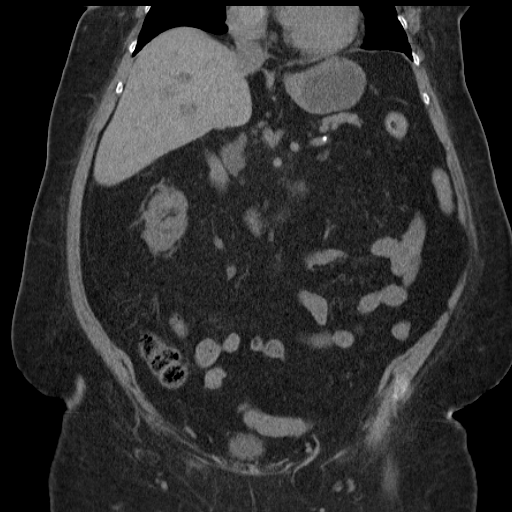
[im 56/101  soft-tissue]
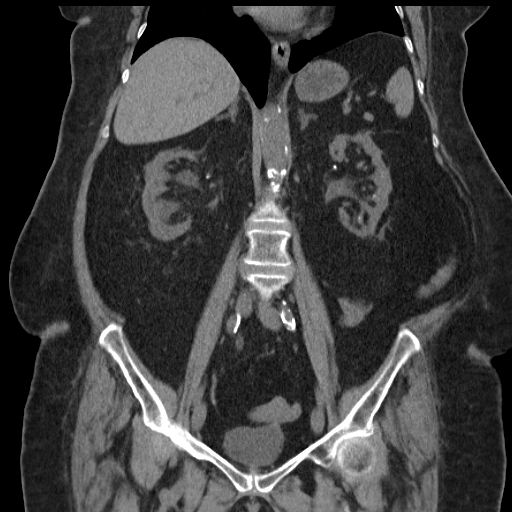

[16 of 46 positions shown; findings below may reference images not displayed]

FINDINGS: Mild left basilar atelectasis is noted.

A single calcified granuloma is noted within the left hepatic lobe.
The liver is otherwise unremarkable in appearance.  The spleen is
within normal limits.  The patient is status post cholecystectomy.
The pancreas and adrenal glands are unremarkable.

There is mild to moderate right-sided hydronephrosis, with
significant perinephric stranding and fluid, and prominence of the
right ureter along its entire course.  There is a 3 x 2 mm stone in
the distal right ureter, approximately 7 cm above the right
vesicoureteral junction, but the ureter remains distended distally,
without evidence of a more distal stone, suggesting that the
primary stone has already passed. No stones are identified within
the bladder.

Scattered small nonobstructing stones are noted throughout both
kidneys; there is also mild scattered calcification within the
renal parenchyma bilaterally.  The kidneys are diffusely atrophic.
Bilateral renal arterial calcifications are noted.

No free fluid is identified.  The small bowel is unremarkable in
appearance.  A small hiatal hernia is noted; the stomach is
otherwise unremarkable in appearance.  No acute vascular
abnormalities are seen. There is diffuse calcification along the
abdominal aorta and its branches

The patient is status post appendectomy.  Scattered diverticulosis
is noted along the ascending colon, and a few diverticula are seen
along the sigmoid colon, without evidence of acute diverticulitis.

The bladder is mildly distended and is unremarkable in appearance.
The patient is status post hysterectomy.  No suspicious adnexal
masses are seen.  Small bilateral inguinal hernias are noted,
containing only fat.  No inguinal lymphadenopathy is appreciated.

No acute osseous abnormalities are identified.
IMPRESSION: 1.  Mild to moderate right-sided hydronephrosis, with significant
perinephric stranding and fluid.  The ureter is distended along its
entire course.  A 3 x 2 mm stone is noted in the distal right
ureter, 7 cm above the right vesicoureteral junction, but given
that  no further distal stone is seen despite more distal ureteral
distension, findings suggest that a primary stone has already
passed.  No stones are identified within the bladder.
2.  Scattered small nonobstructing stones noted throughout both
kidneys; additional mild scattered calcification within the renal
parenchyma bilaterally, with diffuse bilateral renal atrophy and
bilateral renal arterial calcifications.
3.  Small hiatal hernia noted.
4.  Scattered diverticulosis along the descending colon, with a few
diverticula along the sigmoid colon, but no evidence of
diverticulitis.
5.  Small bilateral inguinal hernias, containing only fat.
6.  Mild left basilar atelectasis noted.

## 2009-11-29 IMAGING — CR DG CHEST 1V PORT
1 series · 1 of 1 positions shown · non-contrast
Comparison: Chest x-ray of [DATE]

CLINICAL DATA: Right-sided PICC line placement

PORTABLE CHEST - 1 VIEW

[view not recorded]
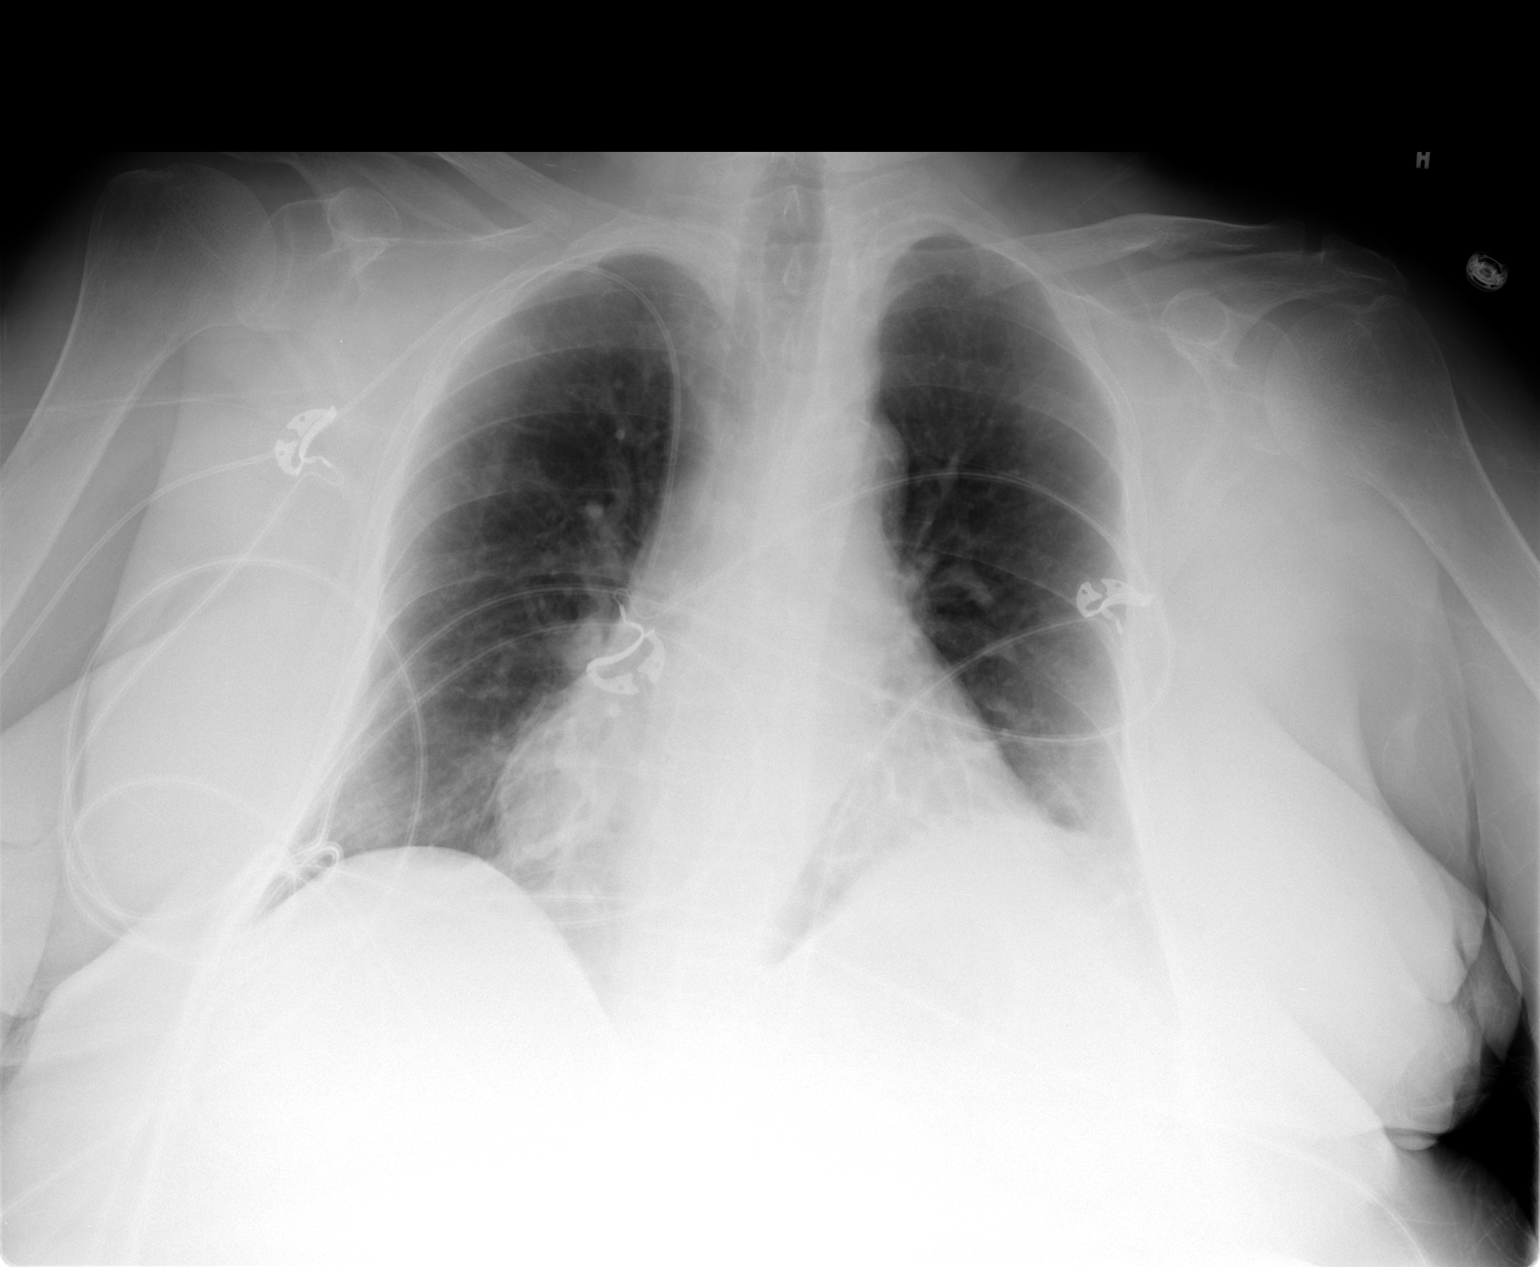

[1 of 1 positions shown; findings below may reference images not displayed]

FINDINGS: The tip of the right upper extremity PICC line is in the
mid SVC.  No pneumothorax is seen.  The lungs are clear.  Mild
cardiomegaly is stable.
IMPRESSION: Right PICC tip in mid SVC.  No pneumothorax.

## 2009-11-30 IMAGING — RF DG RETROGRADE PYELOGRAM
1 series · 13 of 13 positions shown · non-contrast
Comparison: [DATE].

CLINICAL DATA: Kidney stone.  Hydronephrosis.

RETROGRADE PYELOGRAM

[Series 1: run · 13 of 13 slices shown]
[im 1/13]
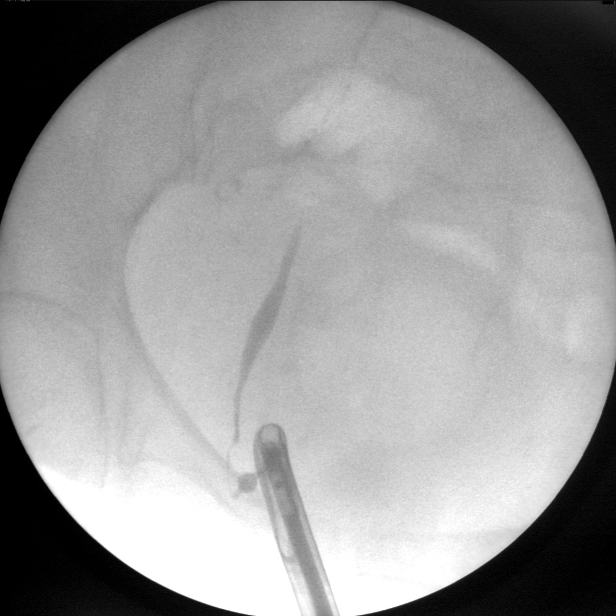
[im 2/13]
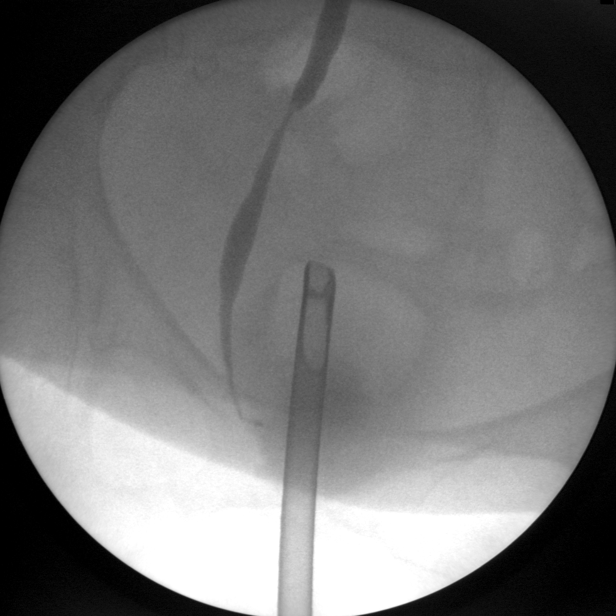
[im 3/13]
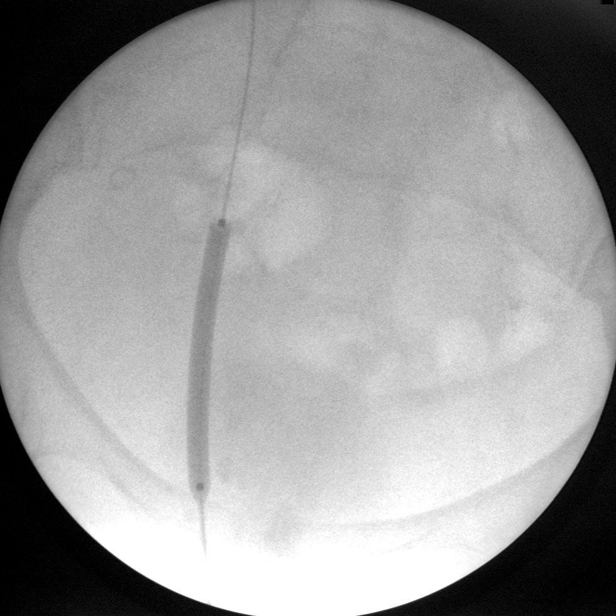
[im 4/13]
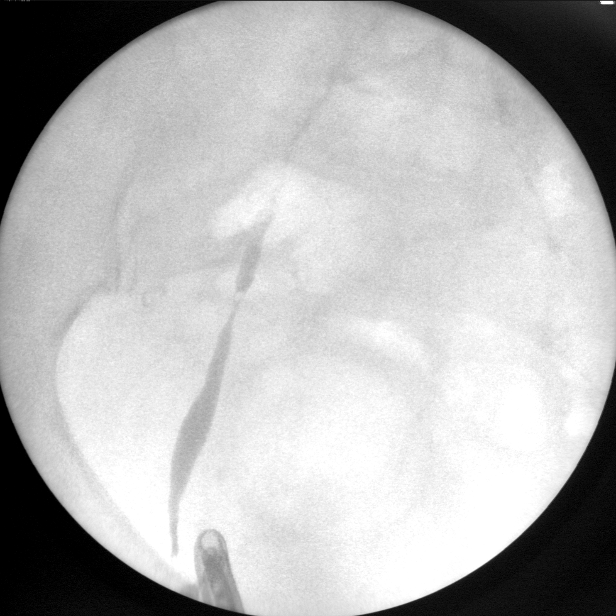
[im 5/13]
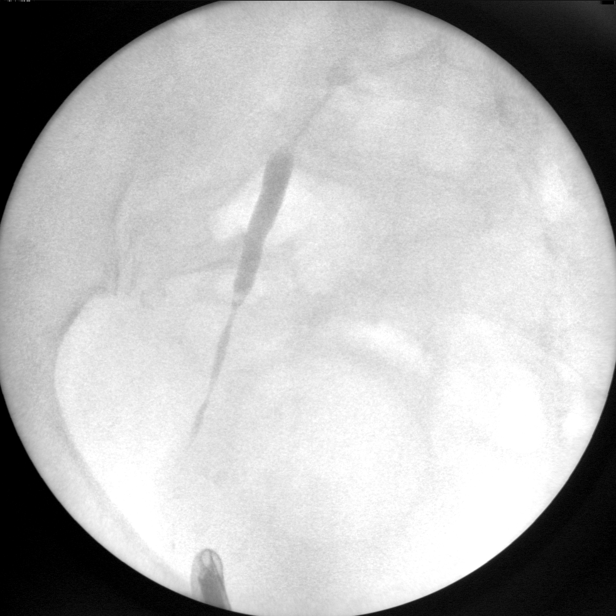
[im 6/13]
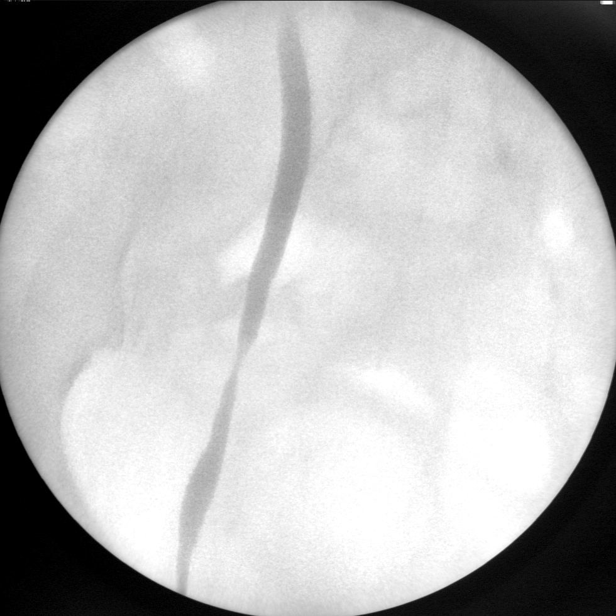
[im 7/13]
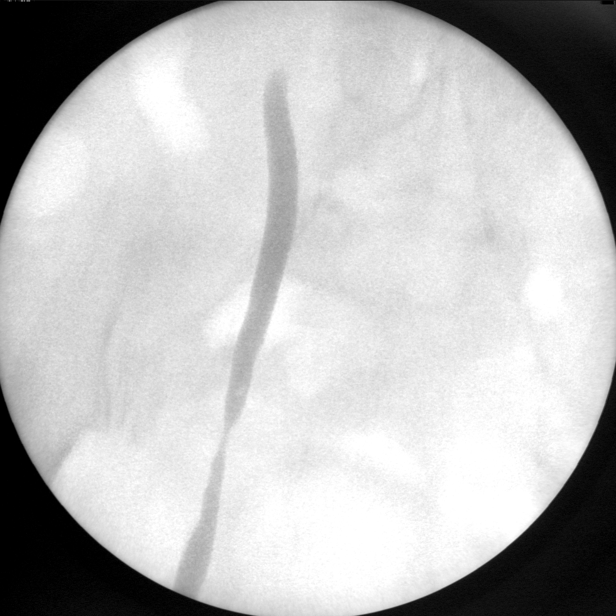
[im 8/13]
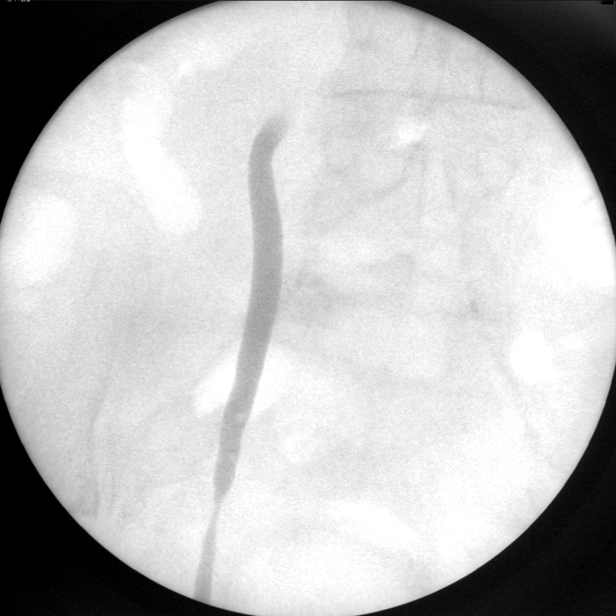
[im 9/13]
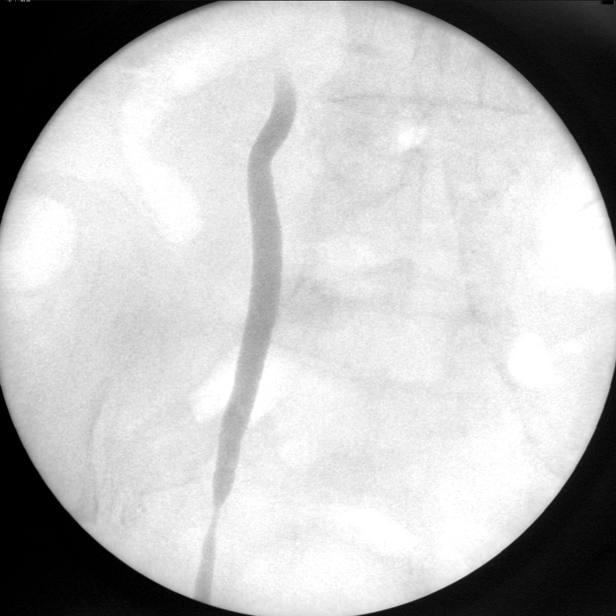
[im 10/13]
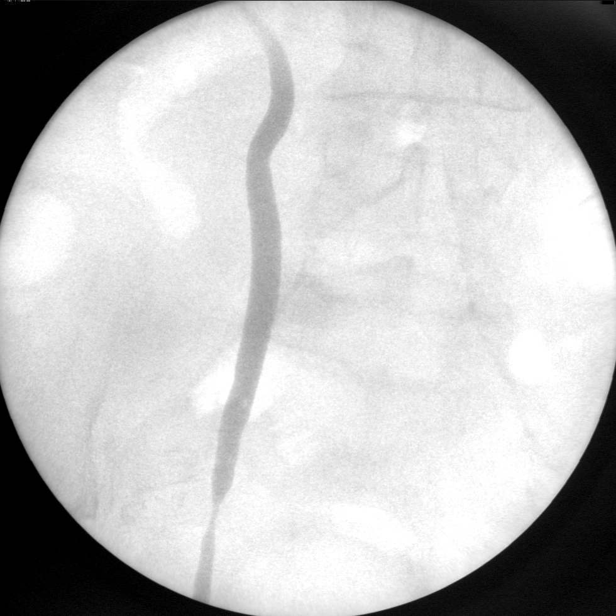
[im 11/13]
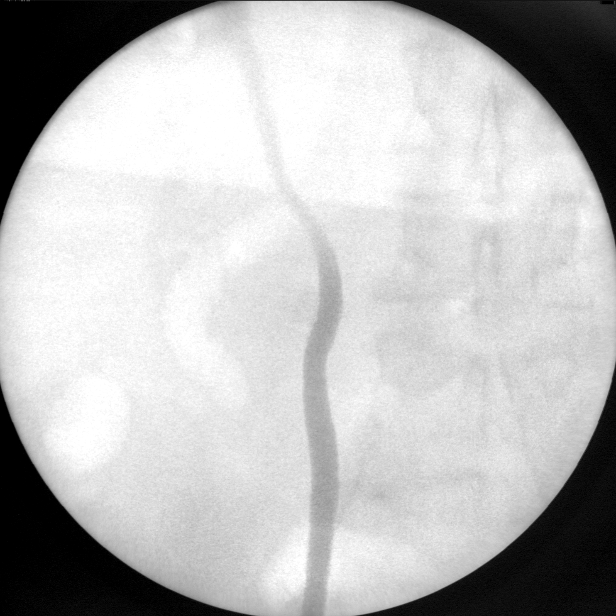
[im 12/13]
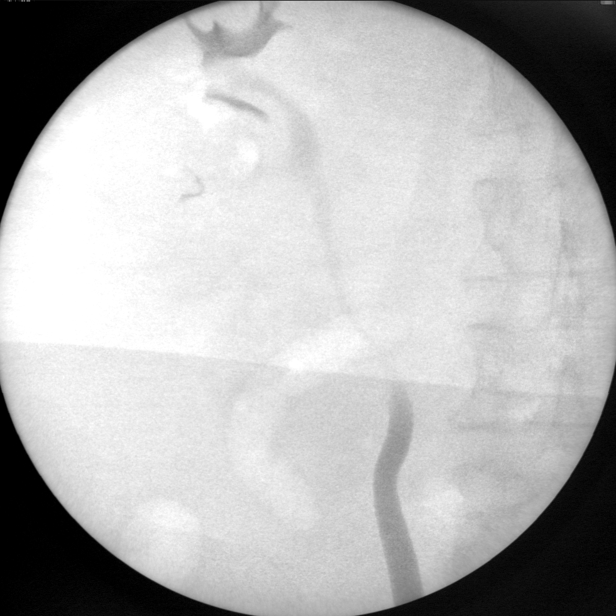
[im 13/13]
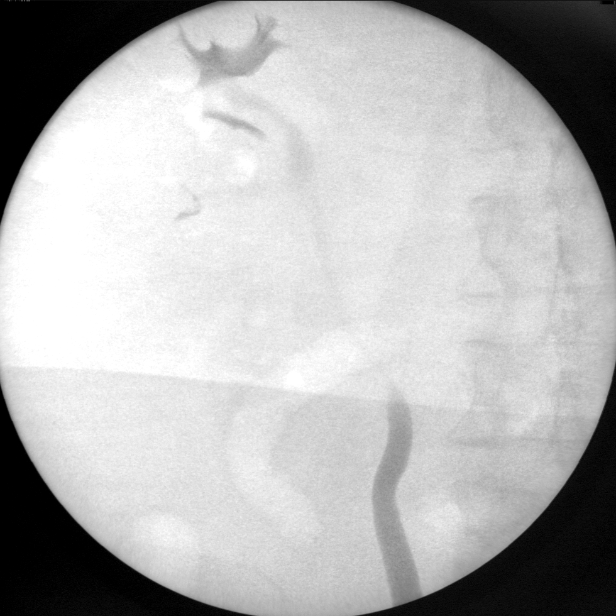

[13 of 13 positions shown; findings below may reference images not displayed]

FINDINGS: Cystoscope is present within the urinary bladder.  13
fluoroscopic spot films submitted for interpretation.  Images
demonstrate cannulation of the right ureter with the wire and
subsequently with catheter.  Retrograde pyelogram demonstrates
small filling defects in the mid to distal ureter consistent with
calculi.  Image number three demonstrates balloon catheter
inflation within the right ureter.
IMPRESSION: Retrograde urogram with small filling defects in the ureter
compatible with renal stones.

## 2009-12-26 ENCOUNTER — Encounter (INDEPENDENT_AMBULATORY_CARE_PROVIDER_SITE_OTHER): Payer: Self-pay | Admitting: *Deleted

## 2009-12-26 ENCOUNTER — Ambulatory Visit: Payer: Self-pay | Admitting: Cardiology

## 2010-01-15 ENCOUNTER — Telehealth (INDEPENDENT_AMBULATORY_CARE_PROVIDER_SITE_OTHER): Payer: Self-pay | Admitting: *Deleted

## 2010-01-16 ENCOUNTER — Encounter: Payer: Self-pay | Admitting: Internal Medicine

## 2010-01-16 ENCOUNTER — Ambulatory Visit: Payer: Self-pay

## 2010-01-16 ENCOUNTER — Ambulatory Visit: Payer: Self-pay | Admitting: Internal Medicine

## 2010-01-16 ENCOUNTER — Ambulatory Visit: Payer: Self-pay | Admitting: Cardiology

## 2010-01-16 ENCOUNTER — Encounter (HOSPITAL_COMMUNITY): Admission: RE | Admit: 2010-01-16 | Discharge: 2010-03-29 | Payer: Self-pay | Admitting: Cardiology

## 2010-02-27 ENCOUNTER — Telehealth: Payer: Self-pay | Admitting: Cardiology

## 2010-03-01 ENCOUNTER — Ambulatory Visit: Payer: Self-pay | Admitting: Cardiology

## 2010-03-04 ENCOUNTER — Encounter: Payer: Self-pay | Admitting: Cardiology

## 2010-05-26 LAB — CONVERTED CEMR LAB
Albumin: 4.5 g/dL (ref 3.5–5.2)
Alkaline Phosphatase: 105 units/L (ref 39–117)
BUN: 24 mg/dL — ABNORMAL HIGH (ref 6–23)
Chloride: 111 meq/L (ref 96–112)
Cholesterol: 168 mg/dL (ref 0–200)
Direct LDL: 84.1 mg/dL
GFR calc non Af Amer: 31.56 mL/min (ref >60)
Glucose, Bld: 101 mg/dL — ABNORMAL HIGH (ref 70–99)
HDL: 51.7 mg/dL (ref >39.00)
Potassium: 3.9 meq/L (ref 3.5–5.1)
Sodium: 143 meq/L (ref 135–145)
Total CHOL/HDL Ratio: 3
Total Protein: 7.2 g/dL (ref 6.0–8.3)
VLDL: 46.4 mg/dL — ABNORMAL HIGH (ref 0.0–40.0)

## 2010-05-28 NOTE — Assessment & Plan Note (Signed)
Summary: rov per son call/pt had kidney stone removed and was taken of...   Visit Type:  [name]  Primary Provider:  Bonne Dolores, MD   History of Present Illness: [name] returns for followup today.  She has a history of coronary artery disease with her last catheterization in September 2008.  At that time, she was found to have significant left circumflex disease as well as marginal disease to correlate with perfusion abnormality on her Myoview.  However, it was felt that these are small vessels and would be difficult to intervene on either percutaneously or from a surgical standpoint.  Her only other disease was in the PDA at 50-70%.  She has been treated medically. She also has cerebrovascular disease. Last carotid Dopplers were performed in June of 2010. There was a 40-59% bilateral stenosis. Followup was recommended in one year.  I last saw her in Jan 2011. Since then she was recently admitted with nephrolithiasis. Her ACE inhibitor was discontinued but she resume this on her own as her blood pressure was increasing. She denies any dyspnea. There is no recent pedal edema. She occasionally has brief episodes of chest pain which is chronic and unchanged. She has had no prolonged episodes. There is no associated symptoms.  Problems Prior to Update: 1)  Chest Pain  (ICD-786.50) 2)  Carotid Stenosis  (ICD-433.10) 3)  Cad, Unspecified Site  (ICD-414.00) 4)  Hyperlipidemia-mixed  (ICD-272.4) 5)  Hypertension, Unspecified  (ICD-401.9) 6)  Pvd  (ICD-443.9) 7)  Renal Insufficiency  (ICD-588.9) 8)  Dm  (ICD-250.00) 9)  Contrast Dye Allergy, Hx of  (ICD-V15.08) 10)  Hemorrhoids  (ICD-455.6) 11)  Hematochezia  (ICD-578.1) 12)  Constipation  (ICD-564.00)  Current Medications (verified): 1)  Glipizide 10 Mg Xr24h-Tab (Glipizide) .... Two Times A Day 2)  Metoprolol Succinate 50 Mg Xr24h-Tab (Metoprolol Succinate) .... Once Daily 3)  Plavix 75 Mg Tabs (Clopidogrel Bisulfate) .... Once Daily 4)   Amlodipine Besylate 10 Mg Tabs (Amlodipine Besylate) .... Once Daily 5)  Enalapril Maleate 20 Mg Tabs (Enalapril Maleate) .... Once Daily 6)  Isosorbide Mononitrate Cr 30 Mg Xr24h-Tab (Isosorbide Mononitrate) .... Two Times A Day 7)  Valium 10 Mg Tabs (Diazepam) .... 3 Daily 8)  Nitrostat 0.4 Mg Subl (Nitroglycerin) .... As Needed 9)  Lantus Solostar 100 Unit/ml Soln (Insulin Glargine) .... Sliding Scale 10)  Adult Aspirin Low Strength 81 Mg Tbdp (Aspirin) .... Once Daily 11)  Cvs Stool Softener 100 Mg Caps (Docusate Sodium) .... Two Times A Day 12)  Crestor 10 Mg Tabs (Rosuvastatin Calcium) .... Take One Tablet By Mouth Daily.  Allergies: 1)  ! Pcn 2)  ! * Dilaudid 3)  ! Keflex 4)  ! * Ivp Dye 5)  ! Sulfa  Past History:  Past Medical History: CAD, UNSPECIFIED SITE (ICD-414.00)..MI X2 HYPERLIPIDEMIA-MIXED (ICD-272.4) HYPERTENSION, UNSPECIFIED (ICD-401.9) PVD (ICD-443.9) RENAL INSUFFICIENCY (ICD-588.9) DM (ICD-250.00) CONTRAST DYE ALLERGY, HX OF (ICD-V15.08) HEMORRHOIDS (ICD-455.6) Cerebrovascular disease, CVA - left sided weakness, 2 years ago Anxiety Disorder Glaucoma Depression Allergies Kidney Stones    Past Surgical History: Reviewed history from 09/19/2008 and no changes required. Cataract surgery Appendectomy Cholecystectomy Hysterectomy Kidney stone extraction - left  Social History: Reviewed history from 09/12/2008 and no changes required. Marital Status: Married Children: 2 boys Occupation: Retired, Cytogeneticist, Radiation protection practitioner Patient is a former smoker. Quit 2 years ago Alcohol Use - no Illicit Drug Use - no  Review of Systems       no fevers or chills, productive cough, hemoptysis,  dysphasia, odynophagia, melena, hematochezia, dysuria, hematuria, rash, seizure activity, orthopnea, PND, pedal edema, claudication. Remaining systems are negative.   Vital Signs:  Patient profile:   70 year old female Height:      61 inches Weight:       190.50 pounds BMI:     36.12 Pulse rate:   74 / minute BP sitting:   120 / 70  (right arm) Cuff size:   regular  Vitals Entered By: Hansel Feinstein CMA (December 26, 2009 8:46 AM)  Physical Exam  General:  Well-developed well-nourished in no acute distress.  Skin is warm and dry.  HEENT is normal.  Neck is supple. No thyromegaly. Bilateral bruits Chest is clear to auscultation with normal expansion.  Cardiovascular exam is regular rate and rhythm.  Abdominal exam nontender or distended. No masses palpated. Extremities show no edema. neuro grossly intact    EKG  Procedure date:  12/26/2009  Findings:      Normal sinus rhythm at a rate of 69. Axis normal. Nonspecific T-wave changes.  Impression & Recommendations:  Problem # 1:  CAROTID STENOSIS (ICD-433.10) Continue aspirin and statin. Followup carotid Dopplers. Her updated medication list for this problem includes:    Plavix 75 Mg Tabs (Clopidogrel bisulfate) ..... Once daily    Adult Aspirin Low Strength 81 Mg Tbdp (Aspirin) ..... Once daily  Orders: Carotid Duplex (Carotid Duplex)  Problem # 2:  CAD, UNSPECIFIED SITE (ICD-414.00) Continue aspirin, Plavix, ACE inhibitor, beta blocker and statin. Her updated medication list for this problem includes:    Metoprolol Succinate 50 Mg Xr24h-tab (Metoprolol succinate) ..... Once daily    Plavix 75 Mg Tabs (Clopidogrel bisulfate) ..... Once daily    Amlodipine Besylate 10 Mg Tabs (Amlodipine besylate) ..... Once daily    Enalapril Maleate 20 Mg Tabs (Enalapril maleate) ..... Once daily    Isosorbide Mononitrate Cr 30 Mg Xr24h-tab (Isosorbide mononitrate) .Marland Kitchen..Marland Kitchen Two times a day    Nitrostat 0.4 Mg Subl (Nitroglycerin) .Marland Kitchen... As needed    Adult Aspirin Low Strength 81 Mg Tbdp (Aspirin) ..... Once daily  Problem # 3:  HYPERLIPIDEMIA-MIXED (B2193296.4) Crestor recently initiated. Check lipids and liver. Her updated medication list for this problem includes:    Crestor 10 Mg Tabs  (Rosuvastatin calcium) .Marland Kitchen... Take one tablet by mouth daily.  Problem # 4:  HYPERTENSION, UNSPECIFIED (ICD-401.9) Blood pressure controlled on present medications. Will continue. Her updated medication list for this problem includes:    Metoprolol Succinate 50 Mg Xr24h-tab (Metoprolol succinate) ..... Once daily    Amlodipine Besylate 10 Mg Tabs (Amlodipine besylate) ..... Once daily    Enalapril Maleate 20 Mg Tabs (Enalapril maleate) ..... Once daily    Adult Aspirin Low Strength 81 Mg Tbdp (Aspirin) ..... Once daily  Problem # 5:  PVD (ICD-443.9)  Problem # 6:  RENAL INSUFFICIENCY (ICD-588.9) Monitored by primary care.  Problem # 7:  DM (ICD-250.00)  Her updated medication list for this problem includes:    Glipizide 10 Mg Xr24h-tab (Glipizide) .Marland Kitchen..Marland Kitchen Two times a day    Enalapril Maleate 20 Mg Tabs (Enalapril maleate) ..... Once daily    Lantus Solostar 100 Unit/ml Soln (Insulin glargine) ..... Sliding scale    Adult Aspirin Low Strength 81 Mg Tbdp (Aspirin) ..... Once daily  Problem # 8:  CONTRAST DYE ALLERGY, HX OF (ICD-V15.08)  Problem # 9:  CHEST PAIN (ICD-786.50) Schedule Myoview. Symptoms atypical. Her updated medication list for this problem includes:    Metoprolol Succinate 50 Mg Xr24h-tab (Metoprolol  succinate) ..... Once daily    Plavix 75 Mg Tabs (Clopidogrel bisulfate) ..... Once daily    Amlodipine Besylate 10 Mg Tabs (Amlodipine besylate) ..... Once daily    Enalapril Maleate 20 Mg Tabs (Enalapril maleate) ..... Once daily    Isosorbide Mononitrate Cr 30 Mg Xr24h-tab (Isosorbide mononitrate) .Marland Kitchen..Marland Kitchen Two times a day    Nitrostat 0.4 Mg Subl (Nitroglycerin) .Marland Kitchen... As needed    Adult Aspirin Low Strength 81 Mg Tbdp (Aspirin) ..... Once daily  Patient Instructions: 1)  Your physician recommends that you schedule a follow-up appointment in: ONE YEAR 2)  Your physician recommends that you return for lab work [id number] [place] 3)  Your physician has requested that  you have a carotid duplex. This test is an ultrasound of the carotid arteries in your neck. It looks at blood flow through these arteries that supply the brain with blood. Allow one hour for this exam. There are no restrictions or special instructions.

## 2010-05-28 NOTE — Progress Notes (Signed)
Summary: wants lab in [place]   Phone Note Call from Patient Call back at Home Phone [phone]   Caller: Pandora Leiter [phone]  Reason for Call: Talk to Nurse, Lab or Test Results Details for Reason: pt would like to have lab done in [place] on friday.  Initial call taken by: Neil Crouch,  February 27, 2010 3:37 PM  Follow-up for Phone Call        spoke with pt son, order for labs to be done at [facility] lab in [place] mailed to pt Fredia Beets, RN  February 27, 2010 5:27 PM

## 2010-05-28 NOTE — Miscellaneous (Signed)
Summary: Orders Update  Clinical Lists Changes  Orders: Added new Referral order of Nuclear Stress Test (Nuc Stress Test) - Signed

## 2010-05-28 NOTE — Assessment & Plan Note (Signed)
Summary: per check out/saf   Primary Provider:  Bonne Dolores, MD   History of Present Illness: [name] returns for followup today.  She has a history of coronary artery disease with her last catheterization in September 2008.  At that time, she was found to have significant left circumflex disease as well as marginal disease to correlate with perfusion abnormality on her Myoview.  However, it was felt that these are small vessels and would be difficult to intervene on either percutaneously or from a surgical standpoint.  Her only other disease was in the PDA at 50-70%.  She has been treated medically. She also has cerebrovascular disease. Last carotid Dopplers were performed in June of 2010. There was a 40-59% bilateral stenosis. Followup was recommended in one year.  I last saw her in May of 2010.Marland Kitchen Since then she is doing reasonably well. She continues to have occasional chest pain when she is upset. This also occurs if she over exerts. It is relieved with rest. It does not radiate. It is unchanged in frequency or severity compared to previous. She does have mild dyspnea on exertion but there is no orthopnea, PND or pedal edema. She is complaining of significant arthralgias. Note her chest pain is not associated with nausea or shortness of breath but there's mild diaphoresis at times. Note her Lipitor was discontinued recently as it was felt it may be contributing to muscle pain. She is not clear that her symptoms have significantly improved since discontinuing.  Current Medications (verified): 1)  Glipizide 10 Mg Xr24h-Tab (Glipizide) .... Two Times A Day 2)  Metoprolol Succinate 50 Mg Xr24h-Tab (Metoprolol Succinate) .... Once Daily 3)  Plavix 75 Mg Tabs (Clopidogrel Bisulfate) .... Once Daily 4)  Amlodipine Besylate 10 Mg Tabs (Amlodipine Besylate) .... Once Daily 5)  Enalapril Maleate 20 Mg Tabs (Enalapril Maleate) .... Once Daily 6)  Isosorbide Mononitrate Cr 30 Mg Xr24h-Tab (Isosorbide  Mononitrate) .... Two Times A Day 7)  Valium 10 Mg Tabs (Diazepam) .... 3 Daily 8)  Nitrostat 0.4 Mg Subl (Nitroglycerin) .... As Needed 9)  Lantus Solostar 100 Unit/ml Soln (Insulin Glargine) .... Sliding Scale 10)  Adult Aspirin Low Strength 81 Mg Tbdp (Aspirin) .... Once Daily 11)  Cvs Stool Softener 100 Mg Caps (Docusate Sodium) .... Two Times A Day  Allergies: 1)  ! Pcn 2)  ! * Dilaudid 3)  ! Keflex 4)  ! * Ivp Dye  Past History:  Past Medical History: Reviewed history from 09/19/2008 and no changes required. CAD, UNSPECIFIED SITE (ICD-414.00)..MI X2 HYPERLIPIDEMIA-MIXED (ICD-272.4) HYPERTENSION, UNSPECIFIED (ICD-401.9) PVD (ICD-443.9) RENAL INSUFFICIENCY (ICD-588.9) DM (ICD-250.00) CONTRAST DYE ALLERGY, HX OF (ICD-V15.08) HEMORRHOIDS (ICD-455.6) HEMATOCHEZIA (ICD-578.1) CONSTIPATION (ICD-564.00) CRI Cerebrovascular disease, CVA - left sided weakness, 2 years ago Anxiety Disorder Glaucoma Depression Allergies Kidney Stones    Past Surgical History: Reviewed history from 09/19/2008 and no changes required. Cataract surgery Appendectomy Cholecystectomy Hysterectomy Kidney stone extraction - left  Social History: Reviewed history from 09/12/2008 and no changes required. Marital Status: Married Children: 2 boys Occupation: Retired, Cytogeneticist, Radiation protection practitioner Patient is a former smoker. Quit 2 years ago Alcohol Use - no Illicit Drug Use - no  Review of Systems       Problems with arthritis but no fevers or chills, productive cough, hemoptysis, dysphasia, odynophagia, melena, hematochezia, dysuria, hematuria, rash, seizure activity, orthopnea, PND, pedal edema, claudication. Remaining systems are negative.   Vital Signs:  Patient profile:   70 year old female Height:  61 inches Weight:      200 pounds BMI:     37.93 Pulse rate:   75 / minute Resp:     12 per minute BP sitting:   180 / 80  (left arm)  Vitals Entered By: Burnett Kanaris (May 02, 2009 10:18 AM)  Physical Exam  General:  Well-developed well-nourished in no acute distress.  Skin is warm and dry.  HEENT is normal.  Neck is supple. No thyromegaly.  Chest is clear to auscultation with normal expansion.  Cardiovascular exam is regular rate and rhythm.  Abdominal exam nontender or distended. No masses palpated. Extremities show no edema. neuro grossly intact    EKG  Procedure date:  05/02/2009  Findings:      Sinus rhythm at a rate of 75. Axis normal. Nonspecific T-wave changes.  Impression & Recommendations:  Problem # 1:  CHEST PAIN (ICD-786.50) Her symptoms are essentially unchanged. We'll continue with medical therapy. I will most likely repeat her Myoview when I see her back in 9 months. Her updated medication list for this problem includes:    Metoprolol Succinate 50 Mg Xr24h-tab (Metoprolol succinate) ..... Once daily    Plavix 75 Mg Tabs (Clopidogrel bisulfate) ..... Once daily    Amlodipine Besylate 10 Mg Tabs (Amlodipine besylate) ..... Once daily    Enalapril Maleate 20 Mg Tabs (Enalapril maleate) ..... Once daily    Isosorbide Mononitrate Cr 30 Mg Xr24h-tab (Isosorbide mononitrate) .Marland Kitchen..Marland Kitchen Two times a day    Nitrostat 0.4 Mg Subl (Nitroglycerin) .Marland Kitchen... As needed    Adult Aspirin Low Strength 81 Mg Tbdp (Aspirin) ..... Once daily  Problem # 2:  CAROTID STENOSIS (ICD-433.10) Continue aspirin. If her myalgias do not improve would recommend resuming Lipitor. If they do improve then we will try Crestor. Followup carotid Dopplers June of 2010. Her updated medication list for this problem includes:    Plavix 75 Mg Tabs (Clopidogrel bisulfate) ..... Once daily    Adult Aspirin Low Strength 81 Mg Tbdp (Aspirin) ..... Once daily  Problem # 3:  CAD, UNSPECIFIED SITE (ICD-414.00) Continue aspirin, Plavix, ACE inhibitor and beta blocker. Resume statin as outlined above. Her updated medication list for this problem includes:    Metoprolol  Succinate 50 Mg Xr24h-tab (Metoprolol succinate) ..... Once daily    Plavix 75 Mg Tabs (Clopidogrel bisulfate) ..... Once daily    Amlodipine Besylate 10 Mg Tabs (Amlodipine besylate) ..... Once daily    Enalapril Maleate 20 Mg Tabs (Enalapril maleate) ..... Once daily    Isosorbide Mononitrate Cr 30 Mg Xr24h-tab (Isosorbide mononitrate) .Marland Kitchen..Marland Kitchen Two times a day    Nitrostat 0.4 Mg Subl (Nitroglycerin) .Marland Kitchen... As needed    Adult Aspirin Low Strength 81 Mg Tbdp (Aspirin) ..... Once daily  Problem # 4:  HYPERLIPIDEMIA-MIXED (ICD-272.4) Resume statin as outlined above. Hopefully she will tolerate. The following medications were removed from the medication list:    Lipitor 80 Mg Tabs (Atorvastatin calcium) ..... Once daily  Problem # 5:  HYPERTENSION, UNSPECIFIED (ICD-401.9) Blood pressure is elevated but she is taking none of her medications this morning. She will track this at home and we will adjust as indicated. Her updated medication list for this problem includes:    Metoprolol Succinate 50 Mg Xr24h-tab (Metoprolol succinate) ..... Once daily    Amlodipine Besylate 10 Mg Tabs (Amlodipine besylate) ..... Once daily    Enalapril Maleate 20 Mg Tabs (Enalapril maleate) ..... Once daily    Adult Aspirin Low Strength 81 Mg Tbdp (  Aspirin) ..... Once daily  Problem # 6:  RENAL INSUFFICIENCY (ICD-588.9) Monitored by her primary care.  Problem # 7:  DM (ICD-250.00)  The following medications were removed from the medication list:    Actos 45 Mg Tabs (Pioglitazone hcl) ..... Once daily Her updated medication list for this problem includes:    Glipizide 10 Mg Xr24h-tab (Glipizide) .Marland Kitchen..Marland Kitchen Two times a day    Enalapril Maleate 20 Mg Tabs (Enalapril maleate) ..... Once daily    Lantus Solostar 100 Unit/ml Soln (Insulin glargine) ..... Sliding scale    Adult Aspirin Low Strength 81 Mg Tbdp (Aspirin) ..... Once daily  Problem # 8:  CONTRAST DYE ALLERGY, HX OF (ICD-V15.08)  Patient Instructions: 1)   Your physician recommends that you schedule a follow-up appointment in: 9 months

## 2010-05-28 NOTE — Assessment & Plan Note (Signed)
Summary: Cardiology Nuclear Testing  Nuclear Med Background Indications for Stress Test: Evaluation for Ischemia   History: Echo, Heart Catheterization, Myocardial Infarction, Myocardial Perfusion Study  History Comments: '08 [id number] to moderate infero-lateral ischemia>Cath:sig. CAD, medical tx  Symptoms: Chest Pain, Chest Pain with Exertion, Chest Pressure, Chest Pressure with Exertion, Diaphoresis, Dizziness, DOE, Fatigue, Nausea, Near Syncope, Palpitations, SOB  Symptoms Comments: Last episode of CP:2 days ago.   Nuclear Pre-Procedure Cardiac Risk Factors: Carotid Disease, CVA, Family History - CAD, History of Smoking, Hypertension, IDDM Type 2, Lipids, Obesity, PVD Caffeine/Decaff Intake: None NPO After: 8:30 AM Lungs: Clear.  O2 Sat 97% on RA. IV 0.9% NS with Angio Cath: 22g     IV Site: (R) Hand IV Started by: Eliezer Lofts, EMT-P Chest Size (in) 44     Cup Size C     Height (in): 61 Weight (lb): 192 BMI: 36.41 Tech Comments: CBG= 86 at 8:30 am per Pt.  Nuclear Med Study 1 or 2 day study:  1 day     Stress Test Type:  Carlton Adam Reading MD:  Glori Bickers, MD     Referring MD:  Kirk Ruths, MD Resting Radionuclide:  Technetium 40m Tetrofosmin     Resting Radionuclide Dose:  11 mCi  Stress Radionuclide:  Technetium 19m Tetrofosmin     Stress Radionuclide Dose:  33 mCi   Stress Protocol   Lexiscan: 0.4 mg   Stress Test Technologist:  Valetta Fuller, CMA-N     Nuclear Technologist:  Annye Rusk, CNMT  Rest Procedure  Myocardial perfusion imaging was performed at rest 45 minutes following the intravenous administration of Technetium 18m Tetrofosmin.  Stress Procedure  The patient received IV Lexiscan 0.4 mg over 15-seconds.  Technetium 41m Tetrofosmin injected at 30-seconds.  There were no significant changes with infusion.  Quantitative spect images were obtained after a 45 minute delay.  QPS Raw Data Images:  Normal; no motion artifact; normal heart/lung  ratio. Stress Images:  Decreased uptake in the inferolateral wall Rest Images:  Decreased uptake in the inferolateral wall Subtraction (SDS):  Previous inferolateral infarct with mild to moderate peri-infarct ischemia. Transient Ischemic Dilatation:  .88  (Normal <1.22)  Lung/Heart Ratio:  .27  (Normal <0.45)  Quantitative Gated Spect Images QGS EDV:  63 ml QGS ESV:  16 ml QGS EF:  74 % QGS cine images:  Decreased thickening of the inferolateral wall  Findings Abnormal nuclear study      Overall Impression  Exercise Capacity: Lexiscan with no exercise. ECG Impression: No significant ST segment change suggestive of ischemia. Overall Impression: Abnormal stress nuclear study. Overall Impression Comments: Previous inferolateral infarct with mild to moderate peri-infarct ischemia.  Appended Document: Cardiology Nuclear Testing Similar to previous; continue medical therapy  Appended Document: Cardiology Nuclear Testing LMTCB./CY  Appended Document: Cardiology Nuclear Testing pt aware of results

## 2010-05-28 NOTE — Progress Notes (Signed)
Summary: Nuclear Pre-Procedure  Phone Note Outgoing Call   Call placed by: Perrin Maltese, EMT-P,  January 15, 2010 10:50 AM Summary of Call: Reviewed information on Myoview Information Sheet (see scanned document for further details).  Spoke with the patient's husband.      Nuclear Med Background Indications for Stress Test: Evaluation for Ischemia     Symptoms: Chest Pain, Chest Pressure with Exertion, Fatigue, SOB    Nuclear Pre-Procedure Cardiac Risk Factors: Carotid Disease, CVA, Hypertension, IDDM Type 2, PVD Height (in): 9  Nuclear Med Study Referring MD:  B.Crenshaw

## 2010-06-11 ENCOUNTER — Other Ambulatory Visit (HOSPITAL_COMMUNITY): Payer: Self-pay | Admitting: Nephrology

## 2010-06-11 DIAGNOSIS — N289 Disorder of kidney and ureter, unspecified: Secondary | ICD-10-CM

## 2010-06-19 ENCOUNTER — Ambulatory Visit (HOSPITAL_COMMUNITY)
Admission: RE | Admit: 2010-06-19 | Discharge: 2010-06-19 | Disposition: A | Discharge status: Home / Self Care | Payer: BC Managed Care – PPO | Source: Ambulatory Visit | Attending: Nephrology | Admitting: Nephrology

## 2010-06-19 DIAGNOSIS — N289 Disorder of kidney and ureter, unspecified: Secondary | ICD-10-CM | POA: Insufficient documentation

## 2010-06-19 IMAGING — US US RENAL
1 series · 14 of 22 positions shown · non-contrast
Comparison: None.

CLINICAL DATA: Renal insufficiency.  History of urolithiasis.
History of renal atrophy.

RENAL/URINARY TRACT ULTRASOUND COMPLETE

[Series 1: us renal · 0.26mm/px · 14 of 22 slices shown]
[im 1/22]
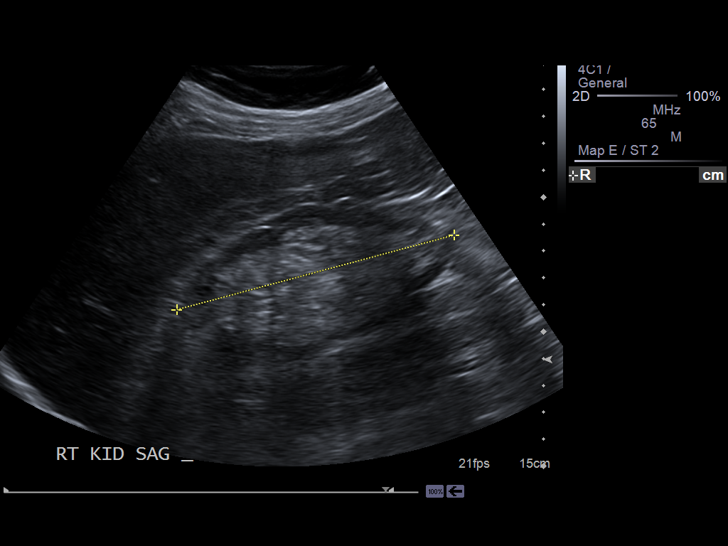
[im 3/22]
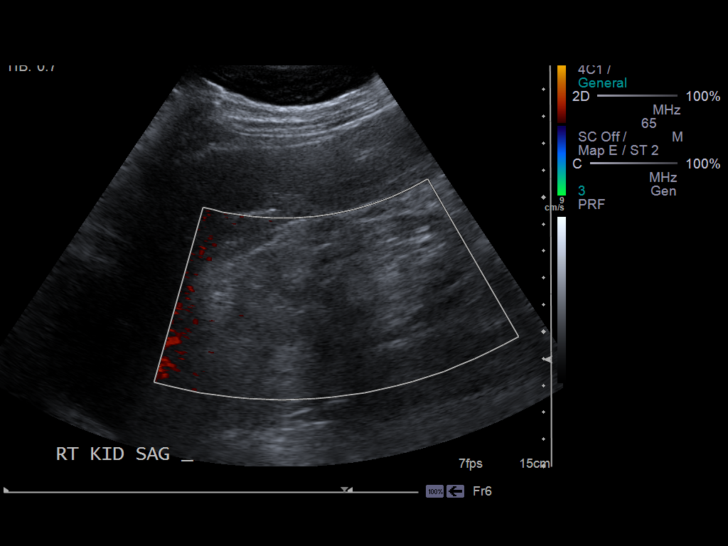
[im 4/22]
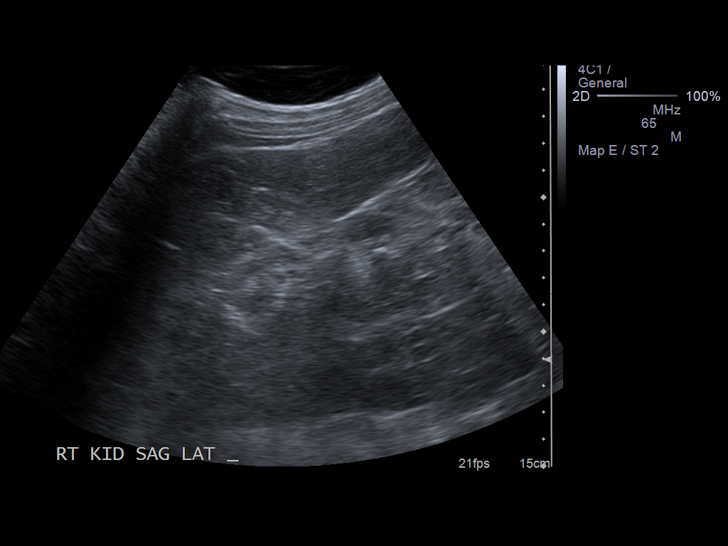
[im 6/22]
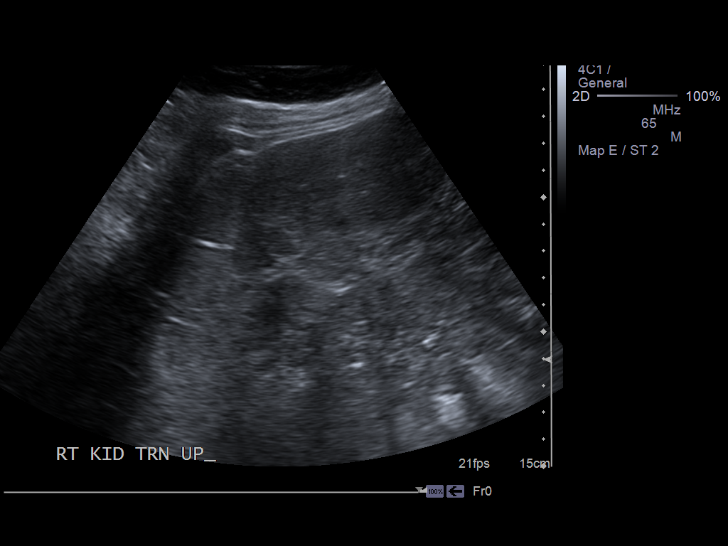
[im 8/22]
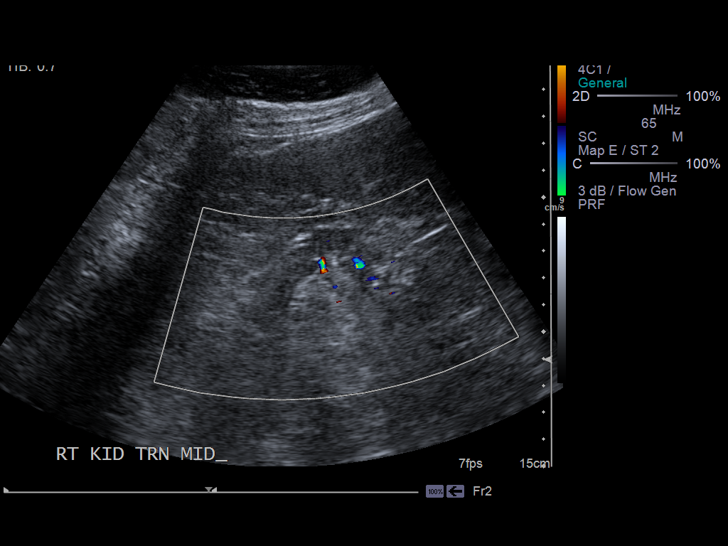
[im 9/22]
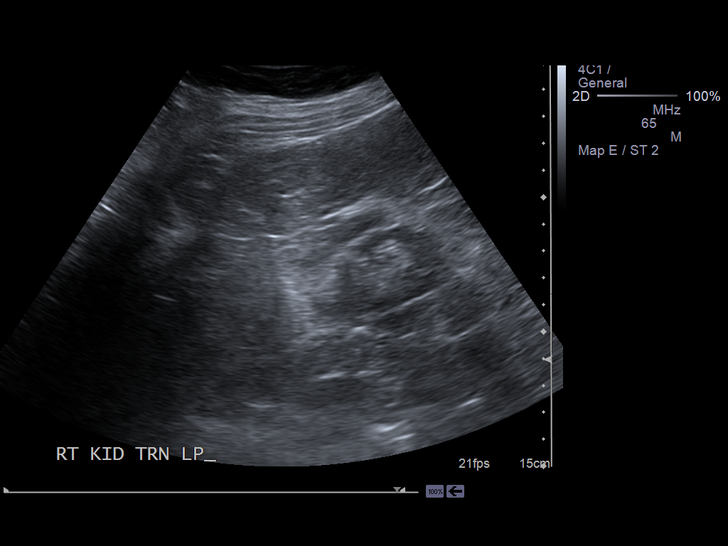
[im 11/22]
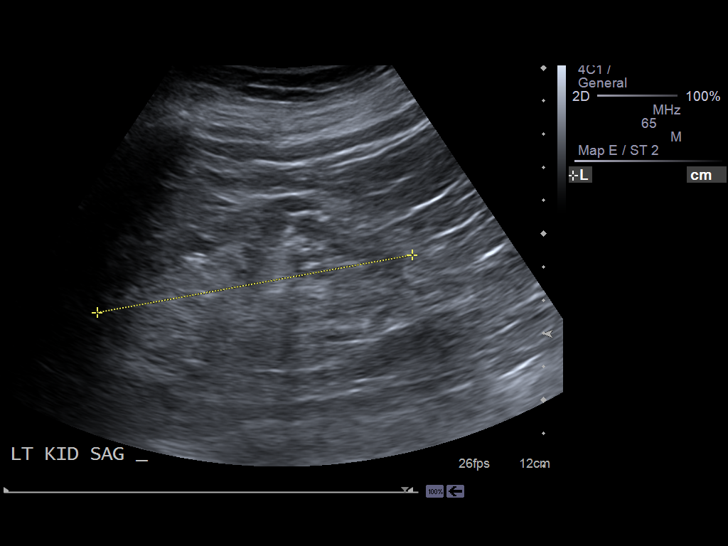
[im 12/22]
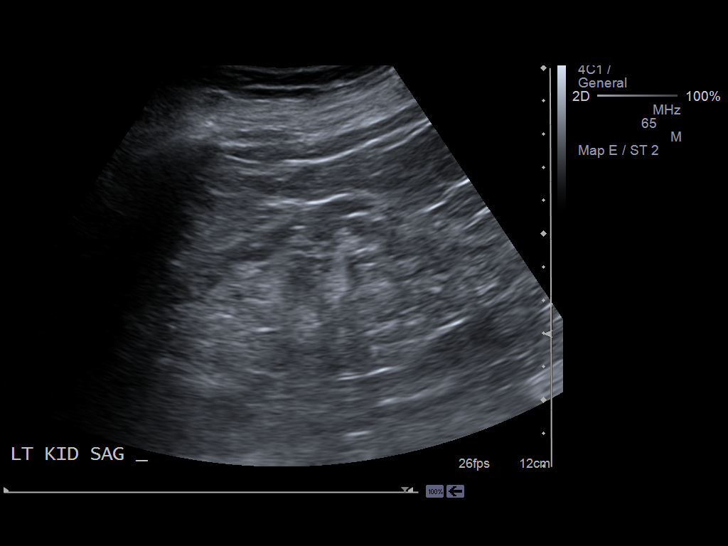
[im 14/22]
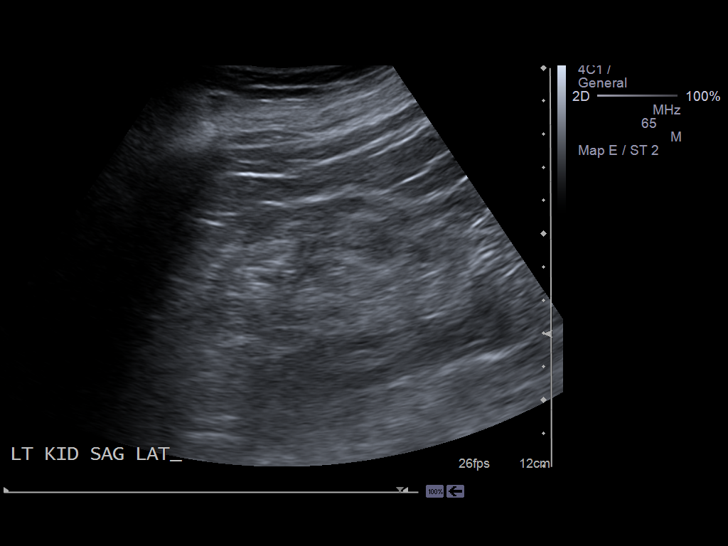
[im 15/22]
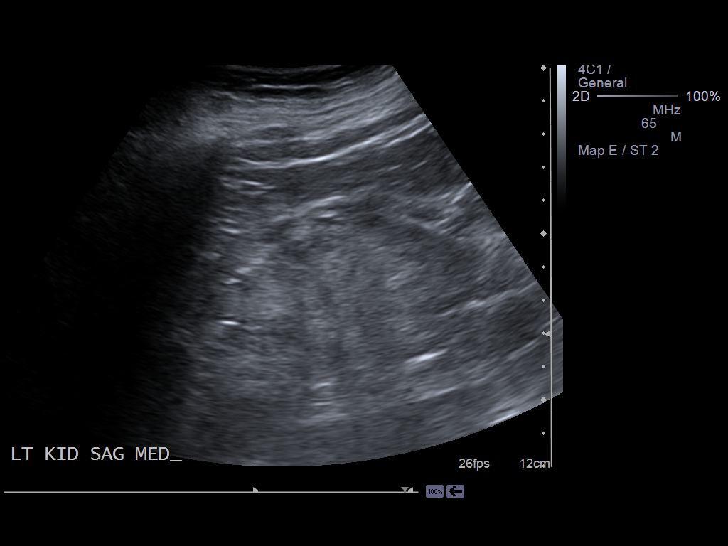
[im 17/22]
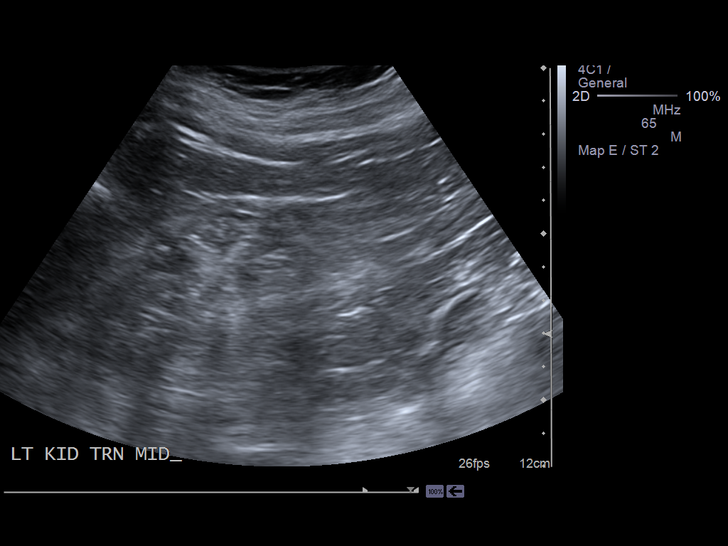
[im 19/22]
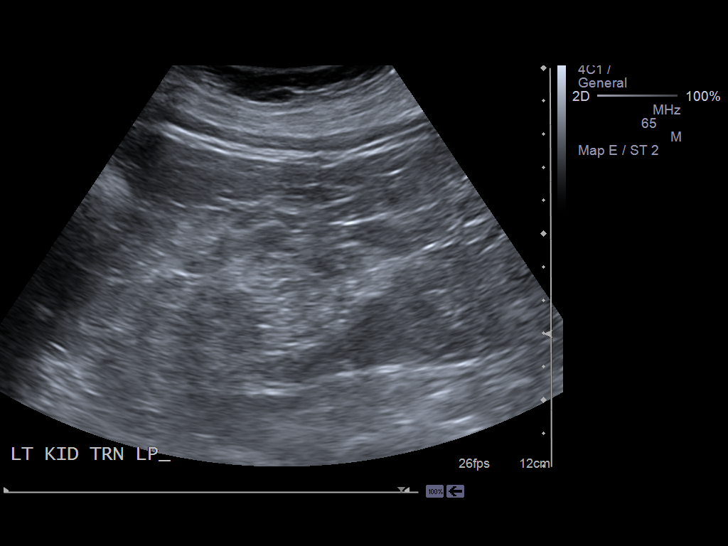
[im 20/22]
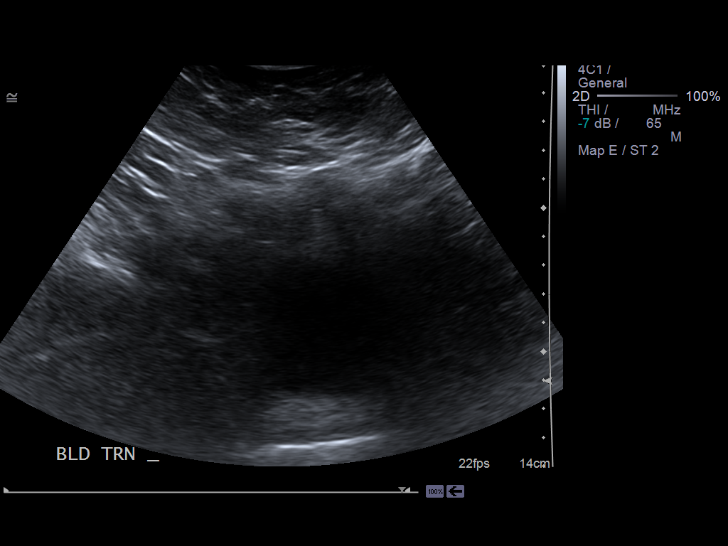
[im 22/22]
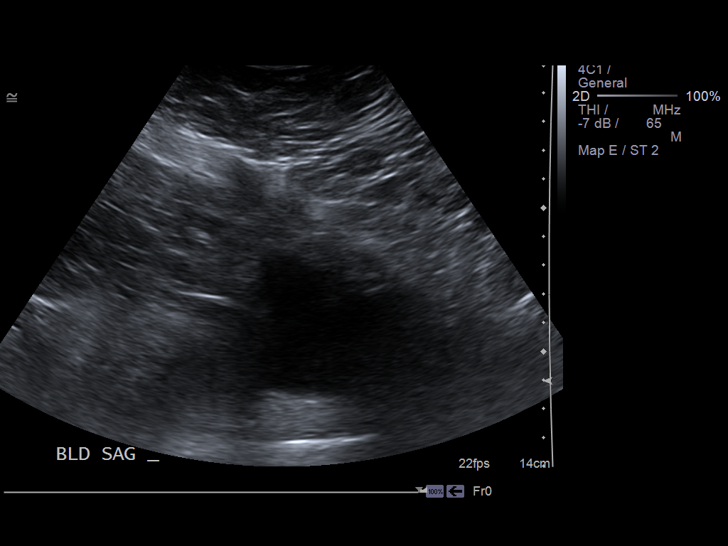

[14 of 22 positions shown; findings below may reference images not displayed]

FINDINGS: Right Kidney:  Right renal length is 10.7 cm.

Left Kidney:  Left renal length is 9.6 cm.

Examination of both kidneys shows bilateral diffuse cortical
thinning with atrophic changes.  There is increased echogenicity of
the renal parenchyma diffusely bilaterally consistent with medical
renal disease.

There is no evidence of hydronephrosis, solid or cystic renal mass,
or calculus.

Bladder:  The bladder was almost empty.  No lesion was seen.
IMPRESSION: Bilateral diffuse parenchymal loss with increased echogenicity of
the parenchyma consistent with chronic medical renal disease.  No
hydronephrosis or renal mass evident.

## 2010-07-12 LAB — URINALYSIS, ROUTINE W REFLEX MICROSCOPIC
Nitrite: NEGATIVE
Specific Gravity, Urine: 1.015 (ref 1.005–1.030)
Urobilinogen, UA: 0.2 mg/dL (ref 0.0–1.0)

## 2010-07-12 LAB — LIPID PANEL
Total CHOL/HDL Ratio: 2.1 RATIO
Triglycerides: 116 mg/dL (ref <150)
VLDL: 23 mg/dL (ref 0–40)

## 2010-07-12 LAB — BASIC METABOLIC PANEL
CO2: 23 mEq/L (ref 19–32)
CO2: 23 mEq/L (ref 19–32)
CO2: 24 mEq/L (ref 19–32)
CO2: 25 mEq/L (ref 19–32)
Calcium: 8.8 mg/dL (ref 8.4–10.5)
Calcium: 9.8 mg/dL (ref 8.4–10.5)
Chloride: 108 mEq/L (ref 96–112)
Chloride: 110 mEq/L (ref 96–112)
Creatinine, Ser: 1.59 mg/dL — ABNORMAL HIGH (ref 0.4–1.2)
Creatinine, Ser: 1.75 mg/dL — ABNORMAL HIGH (ref 0.4–1.2)
Creatinine, Ser: 1.95 mg/dL — ABNORMAL HIGH (ref 0.4–1.2)
GFR calc Af Amer: 31 mL/min — ABNORMAL LOW (ref >60)
GFR calc Af Amer: 35 mL/min — ABNORMAL LOW (ref >60)
GFR calc Af Amer: 39 mL/min — ABNORMAL LOW (ref >60)
GFR calc non Af Amer: 25 mL/min — ABNORMAL LOW (ref >60)
GFR calc non Af Amer: 29 mL/min — ABNORMAL LOW (ref >60)
Glucose, Bld: 129 mg/dL — ABNORMAL HIGH (ref 70–99)
Glucose, Bld: 147 mg/dL — ABNORMAL HIGH (ref 70–99)
Glucose, Bld: 180 mg/dL — ABNORMAL HIGH (ref 70–99)
Potassium: 4 mEq/L (ref 3.5–5.1)
Potassium: 4.4 mEq/L (ref 3.5–5.1)
Sodium: 134 mEq/L — ABNORMAL LOW (ref 135–145)
Sodium: 138 mEq/L (ref 135–145)
Sodium: 140 mEq/L (ref 135–145)

## 2010-07-12 LAB — GLUCOSE, CAPILLARY
Glucose-Capillary: 109 mg/dL — ABNORMAL HIGH (ref 70–99)
Glucose-Capillary: 127 mg/dL — ABNORMAL HIGH (ref 70–99)
Glucose-Capillary: 130 mg/dL — ABNORMAL HIGH (ref 70–99)
Glucose-Capillary: 137 mg/dL — ABNORMAL HIGH (ref 70–99)
Glucose-Capillary: 142 mg/dL — ABNORMAL HIGH (ref 70–99)
Glucose-Capillary: 143 mg/dL — ABNORMAL HIGH (ref 70–99)
Glucose-Capillary: 148 mg/dL — ABNORMAL HIGH (ref 70–99)
Glucose-Capillary: 158 mg/dL — ABNORMAL HIGH (ref 70–99)
Glucose-Capillary: 171 mg/dL — ABNORMAL HIGH (ref 70–99)
Glucose-Capillary: 53 mg/dL — ABNORMAL LOW (ref 70–99)
Glucose-Capillary: 85 mg/dL (ref 70–99)
Glucose-Capillary: 89 mg/dL (ref 70–99)
Glucose-Capillary: 93 mg/dL (ref 70–99)

## 2010-07-12 LAB — DIFFERENTIAL
Basophils Relative: 0 % (ref 0–1)
Basophils Relative: 0 % (ref 0–1)
Eosinophils Absolute: 0.1 10*3/uL (ref 0.0–0.7)
Eosinophils Absolute: 0.3 10*3/uL (ref 0.0–0.7)
Eosinophils Relative: 2 % (ref 0–5)
Lymphocytes Relative: 10 % — ABNORMAL LOW (ref 12–46)
Lymphocytes Relative: 17 % (ref 12–46)
Lymphs Abs: 0.8 10*3/uL (ref 0.7–4.0)
Lymphs Abs: 0.9 10*3/uL (ref 0.7–4.0)
Lymphs Abs: 1.2 10*3/uL (ref 0.7–4.0)
Lymphs Abs: 1.3 10*3/uL (ref 0.7–4.0)
Monocytes Absolute: 0.9 10*3/uL (ref 0.1–1.0)
Monocytes Absolute: 0.9 10*3/uL (ref 0.1–1.0)
Monocytes Relative: 11 % (ref 3–12)
Monocytes Relative: 12 % (ref 3–12)
Monocytes Relative: 13 % — ABNORMAL HIGH (ref 3–12)
Neutro Abs: 5.3 10*3/uL (ref 1.7–7.7)
Neutro Abs: 6.7 10*3/uL (ref 1.7–7.7)
Neutrophils Relative %: 77 % (ref 43–77)

## 2010-07-12 LAB — CBC
HCT: 26.8 % — ABNORMAL LOW (ref 36.0–46.0)
HCT: 27.3 % — ABNORMAL LOW (ref 36.0–46.0)
Hemoglobin: 9.3 g/dL — ABNORMAL LOW (ref 12.0–15.0)
Hemoglobin: 9.3 g/dL — ABNORMAL LOW (ref 12.0–15.0)
MCH: 30.5 pg (ref 26.0–34.0)
MCH: 30.8 pg (ref 26.0–34.0)
MCHC: 34 g/dL (ref 30.0–36.0)
MCHC: 34.2 g/dL (ref 30.0–36.0)
MCHC: 34.6 g/dL (ref 30.0–36.0)
MCV: 89.7 fL (ref 78.0–100.0)
Platelets: 141 10*3/uL — ABNORMAL LOW (ref 150–400)
Platelets: 156 10*3/uL (ref 150–400)
Platelets: 211 10*3/uL (ref 150–400)
RBC: 3.02 MIL/uL — ABNORMAL LOW (ref 3.87–5.11)
RBC: 3.15 MIL/uL — ABNORMAL LOW (ref 3.87–5.11)
RBC: 3.21 MIL/uL — ABNORMAL LOW (ref 3.87–5.11)
RBC: 3.64 MIL/uL — ABNORMAL LOW (ref 3.87–5.11)
RDW: 15.3 % (ref 11.5–15.5)
WBC: 13.1 10*3/uL — ABNORMAL HIGH (ref 4.0–10.5)
WBC: 8.6 10*3/uL (ref 4.0–10.5)

## 2010-07-12 LAB — URINE CULTURE
Colony Count: 85000
Colony Count: NO GROWTH
Culture  Setup Time: 201108052029
Culture: NO GROWTH
Special Requests: NEGATIVE

## 2010-07-12 LAB — URINE MICROSCOPIC-ADD ON

## 2010-07-12 LAB — HEMOGLOBIN A1C: Mean Plasma Glucose: 148 mg/dL — ABNORMAL HIGH (ref <117)

## 2010-07-30 ENCOUNTER — Other Ambulatory Visit (HOSPITAL_COMMUNITY): Payer: Self-pay | Admitting: Oncology

## 2010-07-30 ENCOUNTER — Encounter (HOSPITAL_COMMUNITY): Payer: BC Managed Care – PPO | Attending: Oncology | Admitting: Oncology

## 2010-07-30 DIAGNOSIS — N189 Chronic kidney disease, unspecified: Secondary | ICD-10-CM | POA: Insufficient documentation

## 2010-07-30 DIAGNOSIS — I129 Hypertensive chronic kidney disease with stage 1 through stage 4 chronic kidney disease, or unspecified chronic kidney disease: Secondary | ICD-10-CM | POA: Insufficient documentation

## 2010-07-30 DIAGNOSIS — E8809 Other disorders of plasma-protein metabolism, not elsewhere classified: Secondary | ICD-10-CM

## 2010-07-30 DIAGNOSIS — E119 Type 2 diabetes mellitus without complications: Secondary | ICD-10-CM | POA: Insufficient documentation

## 2010-07-31 LAB — BASIC METABOLIC PANEL
BUN: 31 mg/dL — ABNORMAL HIGH (ref 6–23)
Chloride: 110 mEq/L (ref 96–112)
Creatinine, Ser: 1.48 mg/dL — ABNORMAL HIGH (ref 0.4–1.2)
Glucose, Bld: 82 mg/dL (ref 70–99)
Potassium: 5.3 mEq/L — ABNORMAL HIGH (ref 3.5–5.1)

## 2010-07-31 LAB — DIFFERENTIAL
Basophils Absolute: 0 10*3/uL (ref 0.0–0.1)
Basophils Relative: 1 % (ref 0–1)
Eosinophils Absolute: 0.3 10*3/uL (ref 0.0–0.7)
Eosinophils Relative: 5 % (ref 0–5)
Lymphs Abs: 1.5 10*3/uL (ref 0.7–4.0)
Neutrophils Relative %: 62 % (ref 43–77)

## 2010-07-31 LAB — GLUCOSE, CAPILLARY: Glucose-Capillary: 61 mg/dL — ABNORMAL LOW (ref 70–99)

## 2010-07-31 LAB — CBC
HCT: 32.1 % — ABNORMAL LOW (ref 36.0–46.0)
MCV: 90.4 fL (ref 78.0–100.0)
Platelets: 195 10*3/uL (ref 150–400)
RDW: 15.5 % (ref 11.5–15.5)
WBC: 6.5 10*3/uL (ref 4.0–10.5)

## 2010-08-01 LAB — PROTEIN ELECTROPH W RFLX QUANT IMMUNOGLOBULINS
Alpha-2-Globulin: 14.1 % — ABNORMAL HIGH (ref 7.1–11.8)
Beta 2: 4.8 % (ref 3.2–6.5)
Beta Globulin: 6.2 % (ref 4.7–7.2)
Gamma Globulin: 10.2 % — ABNORMAL LOW (ref 11.1–18.8)
M-Spike, %: NOT DETECTED g/dL
Total Protein ELP: 7.2 g/dL (ref 6.0–8.3)

## 2010-08-01 LAB — IMMUNOFIXATION ELECTROPHORESIS: IgA: 128 mg/dL (ref 68–378)

## 2010-08-05 LAB — KAPPA/LAMBDA LIGHT CHAINS
Kappa free light chain: 1.2 mg/dL (ref 0.33–1.94)
Kappa, lambda light chain ratio: 0.64 (ref 0.26–1.65)
Lambda free light chains: 1.87 mg/dL (ref 0.57–2.63)

## 2010-08-06 LAB — URINALYSIS, ROUTINE W REFLEX MICROSCOPIC
Glucose, UA: NEGATIVE mg/dL
Hgb urine dipstick: NEGATIVE
Ketones, ur: NEGATIVE mg/dL
Leukocytes, UA: NEGATIVE
Protein, ur: 100 mg/dL — AB
pH: 5.5 (ref 5.0–8.0)

## 2010-08-06 LAB — URINE MICROSCOPIC-ADD ON

## 2010-08-13 ENCOUNTER — Encounter (HOSPITAL_COMMUNITY): Payer: BC Managed Care – PPO | Admitting: Oncology

## 2010-08-13 DIAGNOSIS — E8809 Other disorders of plasma-protein metabolism, not elsewhere classified: Secondary | ICD-10-CM

## 2010-09-10 NOTE — Cardiovascular Report (Signed)
NAME:  [name], [name]            ACCOUNT NO.:  [id number]   MEDICAL RECORD NO.:  [id number]          PATIENT TYPE:  INP   LOCATION:  2036                         FACILITY:  [facility]   PHYSICIAN:  Loretha Brasil. Lia Foyer, MD, FACCDATE OF BIRTH:  11/11/1940   DATE OF PROCEDURE:  DATE OF DISCHARGE:                            CARDIAC CATHETERIZATION   INDICATIONS:  Ms. Mccready is known to [place].  She has significant  diabetes.  She previously presented with an acute MI, and had a total  occlusion of the circumflex which was treated conservatively, because of  late presentation.  The current study was done to assess coronary  anatomy.  She has had increasing symptoms and has an abnormal myocardial  perfusion imaging study, particularly in the lateral wall.   PROCEDURE:  Also, the patient has a history of insufficiency, and also  dye allergy, and so she was premedicated and started on fluids prior to  the procedure.   PROCEDURE:  1. Left heart catheterization.  2. Selective coronary arteriography.   DESCRIPTION OF PROCEDURE:  The patient brought to the catheterization  laboratory and prepped and draped in the usual fashion.  Through an  anterior puncture, the right femoral artery was easily entered, using a  Smart needle.  A 5-French sheath was placed.  Views of the left and  right coronary arteries was then obtained.  Central aortic and left  ventricular pressures was measured with a pigtail.  Ventriculography was  not performed, because of the need to conserve contrast.  She tolerated  the procedure well, and there were no complications.  She was given  labetalol at the end of procedure, for a blood pressure reduction.   Total contrast was 30 mL.  I then talked with Dr. Stanford Breed and also with  the patient's family.   HEMODYNAMIC DATA:  1. Central aortic pressure 170/82, mean 120.  2. Left atrial pressure 173/12.  3. No gradient pullback across the aortic valve.   ANGIOGRAPHIC DATA:  1.  The left main coronary artery is a large-caliber vessel without      significant obstruction.  2. The LAD has about 30% narrowing proximally.  The diagonal has about      40% narrowing proximally.  It is a large-caliber vessel, and there      is a fair amount of luminal irregularity throughout the LAD, but no      areas of high-grade obstruction.  3. The circumflex provides a first marginal or ramus-type vessel that      is small in caliber and diffusely diseased with proximal 80%      narrowing.  Likewise, the second marginal has about 70% narrowing.      The third marginal now is open, and is hazy throughout, but is only      a 1-1/2-mm artery throughout and is a re-perfused vessel.  The AV      circumflex leading into the small posterolateral system has about      40% narrowing.  4. The right coronary artery is largely unchanged from the previous      study.  There is some  irregularity distally of about 30% to 40%.      There is an ostial lesion just after the takeoff of the PDA that      has about 50% to 70% narrowing.  There is a small PDA that has      proximal narrowing of probably 50% to 70%.  5. No left ventriculography was done for contrast load.   CONCLUSIONS:  1. Reperfusion of the third marginal branch from previous study with a      small diffusely diseased vessel, not optimal for percutaneous      intervention or bypass.  2. Diffuse marginal disease as noted, likely accounting for the      patient's abnormal myocardial perfusion study.  3. Non-critical plaquing of the left anterior descending and right      coronary arteries as noted above.   RECOMMENDATIONS:  The current situation certainly is not ideal for  bypass.  The LAD is not critically diseased, nor is the right.  The  marginals are really too small.  The marginal is not favorable for  percutaneous intervention, because of it's size.  Continued medical  therapy will be recommended.  I have called Dr. Stanford Breed  and reviewed  this with him.      Loretha Brasil. Lia Foyer, MD, [facility]  Electronically Signed     TDS/MEDQ  D:  12/31/2006  T:  12/31/2006  Job:  [id number]   cc:   Loretha Brasil. Lia Foyer, MD, [facility]  Denice Bors. Stanford Breed, MD, [facility]

## 2010-09-10 NOTE — Assessment & Plan Note (Signed)
[name] OFFICE NOTE   NAME:[name]                   MRN:          [id number]  DATE:02/09/2007                            DOB:          08/13/1940    [name] returns for followup today. We recently performed a Myoview  on her for recurrent chest pain. This showed inferolateral ischemia.  Given the persistent nature of her symptoms, we proceeded with a cardiac  catheterization on December 31, 2006. Her coronary anatomy was  essentially unchanged. She was found to have significant circumflex  disease as well as marginal disease, and this correlated with her defect  on her Myoview. However, it was felt that these were small vessels and  were not ideal for PCI or coronary bypass and graft. We have therefore  planned to treat her medically. Since she was discharged, she does have  some dyspnea on exertion, but there is no orthopnea or PND. There can be  occasional mild pedal edema. She has not had any severe chest pain by  her report.   Her medications include:  1. Aspirin 81 mg p.o. daily.  2. Plavix 75 mg p.o. daily.  3. Insulin.  4. Lidoderm.  5. Norvasc 10 mg p.o. daily.  6. Isosorbide 30 mg p.o. daily.  7. Glipizide 10 mg p.o. b.i.d.  8. Lipitor 80 mg p.o. daily.  9. Toprol 50 mg p.o. daily.  10.Stool softener.   PHYSICAL EXAMINATION:  Today shows a blood pressure of 173/81, and her  pulse is 73.  Her HEENT is normal.  Her neck is supple.  Her chest is clear.  CARDIOVASCULAR EXAM:  Reveals a regular rate and rhythm.  Her abdominal exam shows no tenderness.  Her right groin shows no hematoma and no bruit.  EXTREMITIES:  Showed trace ankle edema bilaterally.   DIAGNOSES:  1. Coronary artery disease - [name]'s coronary anatomy is      unchanged. We will continue with medical therapy including her      aspirin, Plavix, statin and beta blocker. We held her ACE inhibitor      due  to her worsening renal insufficiency after catheterization. We      will repeat her BMET today. If her renal function has returned to      her baseline, then we will plan to resume her Vasotec and then      recheck her BMET in one week.  2. Hypertension - blood pressure is elevated today, but she is not      taking her medications. We will also resume her Vasotec as outlined      above.  3. Hyperlipidemia - she will continue on her statin.  4. Diabetes mellitus - per primary care physician.  5. Renal insufficiency.  6. History of dye allergy.  7. Peripheral vascular disease.   We will see her back in 6 months.     Denice Bors Stanford Breed, MD, [facility]  Electronically Signed    BSC/MedQ  DD: 02/09/2007  DT: 02/10/2007  Job #: [id number]   cc:   [name], M.D.

## 2010-09-10 NOTE — Assessment & Plan Note (Signed)
[facility]                            CARDIOLOGY OFFICE NOTE   NAME:[name], [name]                   MRN:          [id number]  DATE:09/08/2007                            DOB:          Feb 05, 1941    [name] returns for followup today.  She is a pleasant female who  has a history of coronary disease.  Her last catheterization was in  September 2008.  Medical therapy was recommended.  Please refer to my  previous notes for details.  Since I last saw her, she has mild dyspnea  on exertion and occasional chest pain with exertion, but none at rest.  This is unchanged compared to previous.  There is no significant pedal  edema or syncope.   MEDICATIONS:  1. Aspirin 81 mg daily.  2. Plavix 75 mg daily.  3. Insulin.  4. Imdur 30 mg daily.  5. Glipizide 10 mg p.o. b.i.d.  6. Lipitor 80 mg daily.  7. Toprol 50 mg p.o. daily.  8. Stool softener.  9. Zyrtec.  10.Enalapril 10 mg p.o. b.i.d.  11.Amlodipine 10 mg daily.   PHYSICAL EXAMINATION:  VITAL SIGNS:  Blood pressure of 150/78 and pulse  is 64.  She weighs 180 pounds.  HEENT:  Normal.  NECK:  Supple.  CHEST:  Clear.  CARDIOVASCULAR:  Regular rate and rhythm.  ABDOMEN:  Shows no tenderness.  EXTREMITIES:  Show chronic skin changes, but no edema.   STUDIES:  Electrocardiogram showed a sinus rhythm at rate of 68.  There  are nonspecific ST changes.   DIAGNOSIS:  1. Coronary artery disease.  [name]'s symptoms are essentially      unchanged.  She has some dyspnea on exertion and chest pain with      exertion, but she has had this previously.  Her recent      catheterization is outlined previously and medical therapy was      recommended.  We will continue with her aspirin, Plavix, statin,      beta blocker and ACE inhibitor.  2. Hypertension.  Her blood pressure appears to be adequately      controlled on her present medications.  3. Hyperlipidemia.  She will continue her statin.   We will check      lipids and liver today.  Given her ACE inhibitor use and history of      mild renal insufficiency, we will check a B-met as well.  4. Diabetes mellitus.  Managed per primary care physician.  5. Renal insufficiency.  6. History of dye allergy.  7. Peripheral vascular disease.   We will see her back in 6 months.     Denice Bors Stanford Breed, MD, [facility]  Electronically Signed    BSC/MedQ  DD: 09/08/2007  DT: 09/08/2007  Job #: [id number]   cc:   [name], M.D.

## 2010-09-10 NOTE — Assessment & Plan Note (Signed)
[place] NOTE   NAME:Hendricksen, LILLAN LUGINBUHL                   MRN:          [id number]  DATE:12/10/2006                            DOB:          [birth year]    Ms. Noon comes in today for the first time to our lipid clinic for  management of her hyperlipidemia.  She has a past medical history that  includes coronary artery disease with recent MI, hypertension, type 2  diabetes and chronic renal insufficiency.   CURRENT MEDICATIONS:  1. Lipitor 80 mg daily.  2. Aspirin.  3. Plavix.  4. Insulin sliding scale.  5. Lidoderm patch.  6. Norvasc.  7. Imdur.  8. Actos.  9. Glipizide.  10.Toprol.  11.Vasotec.  12.Valium.  13.Colace.   ALLERGIES:  PENICILLIN, KEFLEX and IV CONTRAST.   PHYSICAL EXAMINATION:  Blood pressure is 150/70.  Heart rate is 65.  She is in no acute distress.  She is brought in in a wheelchair by her  son.  She is able to get around in the house with a walker, but for the  most part stays in a wheelchair.   LABORATORY DATA:  Total cholesterol 168, triglycerides 256, HDL 49, LDL  88.2, alkaline phosphatase was elevated at 133, AST and ALT are within  normal limits.  The glucose was elevated at 182, as well as the serum  creatinine 1.5.   ASSESSMENT:  Ms. Berrios is tolerating Lipitor 80 mg daily, which she  has been on since May 2007.  Her most recent lipid panel reveals an  elevated triglyceride and LDL.  In the past, her lipids have been at  goal on the current medication with the goals being triglycerides of  less than 150, HDL greater than 45 and LDL less than 70.  She has been  under a lot of stress lately with some illnesses and deaths in the  family, as well as among some friends.  She attributes her elevated  blood sugar to these stressful times over the last few months.  Her  triglycerides are elevated likely secondary to the poor diabetes  control.  Ms. Bowlds is  hesitant to add another medication to help  control her cholesterol.  She is at the maximum dose of Lipitor.  Ms.  Seguin reports that her fasting blood glucoses in the morning over the  last 2 weeks have been in a good range, between 90 and 110.   PLAN:  I asked Ms. Basgall to continue with her Lipitor 80 mg daily,  continue to eat a heart-healthy diet, limiting fried foods and limiting  red meat as much as possible.  She has an appointment with Dr. Stanford Breed  next week, so we will draw blood at that time and call her and review  those results with her and make any changes necessary at that time.  I  believe if her diabetes is indeed under better control, her  triglycerides may be much lower.      Romeo Rabon, PharmD  Electronically Signed      Denice Bors. Stanford Breed, MD,  [facility]  Electronically Signed   TP/MedQ  DD: 12/10/2006  DT: 12/11/2006  Job #: [id number]

## 2010-09-10 NOTE — Discharge Summary (Signed)
NAME:  [name], [name]            ACCOUNT NO.:  [id number]   MEDICAL RECORD NO.:  [id number]          PATIENT TYPE:  INP   LOCATION:  2036                         FACILITY:  [facility]   PHYSICIAN:  Phill Myron. Lawrence, NPDATE OF BIRTH:  02/28/1941   DATE OF ADMISSION:  12/30/2006  DATE OF DISCHARGE:                               DISCHARGE SUMMARY   DICTATED FOR:  Dr. Kirk Ruths.   PRIMARY CARE PHYSICIAN:  Dr. Caron Presume.   PRIMARY CARDIOLOGIST:  Dr.  Kirk Ruths.   PRIMARY DISCHARGE DIAGNOSES:  1. Coronary artery disease.      a.     Abnormal stress Myoview.  2. History of acute myocardial infarction with total occlusion of the      circumflex which is treated conservatively because of late      presentation.  3. Diabetes.  4. Hypertension.  5. Renal insufficiency with baseline creatinine of 1.8.   PROCEDURES PERFORMED:  Cardiac catheterization completed for Dr. Addison Lank on December 31, 2006.  A.  Left main coronary artery is large-caliber vessel without  significant obstruction.  LAD has about 30% narrowing proximally.  The  diagonal has about 40% narrowing proximally.  There is a large-caliber  vessel and there is a fair amount of luminal irregularity throughout the  LAD but no areas of high-grade obstruction.  The circumflex provides a  first marginal or ramus type of vessel that is small in caliber and  diffusely diseased with proximal 80% narrowing.  Likewise the second  marginal has about 70% narrowing.  The third marginal is now open and  hazy throughout that is only 1- to 1.5-mm artery throughout and is a  reperfused vessel.  The AV circumflex leading to the small  posterolateral system has about 40% narrowing.  The right coronary  artery is largely unchanged from the previous study.  There is some  irregularity distally of about 30% to 40%. There is an ostial lesion  just after the takeoff of the PDA that has about 50% to 70% narrowing.  There is a small PDA  that has proximal narrowing and possibly 50% to  70%.  No left ventriculography was done for contrast load.  B.  Reperfusion of the third marginal branch from a previous study with  small diffusely diseased vessel not optimal for percutaneous  intervention or bypass.  Diffused marginal disease as noted, likely  accounting for the patient's abnormal myocardial perfusion study.  Noncritical plaquing of the left anterior descending and right coronary  artery as above.  C.  The current situation is not ideal for bypass.  The LAD is not  critically diseased nor is the right.  The marginals are really too  small.  The marginal is not favorable for percutaneous intervention  because of its size.  Continued medical therapy will be recommended per  Dr. Lia Foyer.   HOSPITAL COURSE:  This is a 70 year old obese Caucasian female who was  admitted on December 30, 2006 for hydration with known renal  insufficiency and diabetes prior to cardiac catheterization and possible  PCI secondary to recurrent chest discomfort and known coronary artery  disease without abnormal Cardiolite stress test showing lateral wall  ischemia.  The patient was treated prophylactically with IV dye allergy  prophylaxis prior to catheterization.  The patient tolerated the  procedure well without any complaints.  The patient also had fingerstick  blood sugars throughout hospitalization and she was found to be mildly  elevated.  The patient was treated with sliding scale insulin.  The  patient had been given the steroid prophylaxis which may have  contributed.   The patient was seen and examined by Dr. Lia Foyer on day of  catheterization.  After catheterization completed Dr. Lia Foyer explained  the procedure results to the patient who verbalized understanding and  the patient will be treated medically.  He also discussed results of  procedure with Dr. Stanford Breed.   The day of discharge the patient was seen and examined by Dr.  Stanford Breed.  The patient remained stable without any further complaints of chest pain  or shortness of breath.  The patient will be returned home on her prior  medication regimen with sublingual nitroglycerin p.r.n. as necessary.  The patient will have a followup appointment with Dr. Stanford Breed and with  Dr. Caron Presume as an outpatient for continued management of diabetes and  medical management.   LABS ON DISCHARGE:  Hemoglobin 10.3, hematocrit 28.9, white blood cells  14.4, platelets 240,000, sodium 138, potassium 4.3, chloride 107, CO2  23, BUN 35, creatinine 1.8, glucose 178.  Chest x-ray borderline  cardiomyopathy without acute cardiopulmonary disease, left base scar or  atelectasis.   PHYSICAL EXAMINATION:  VITAL SIGNS:  Blood pressure 112/63, heart rate  68, respirations 20, temperature 97.1, O2 sat 92% on room air.   DISCHARGE MEDICATIONS:  1. Coated aspirin 81 mg daily.  2. Plavix 75 mg daily.  3. Norvasc 10 mg daily.  4. Isosorbide mononitrate 30 mg daily.  5. Vasotek 10 mg daily.  6. Lipitor 80 mg at bedtime.  7. Actos 45 mg daily.  8. Toprol XL 50 mg daily.  9. Glipizide 10 mg twice a day.  10.NovoLog insulin per sliding scale at home.  11.Thiamine 10 mg t.i.d. p.r.n.  12.Darvocet-N 100 mg p.r.n. pain.  13.Lidoderm 5% daily.   ALLERGIES:  PENCIL LIN, CONTRAST MEDIA, CEPHALEXIN, IODINE.   FOLLOW UP PLANS AND APPOINTMENTS:  1. The patient will follow up with Dr. Kirk Ruths on January 25, 2007 at 7:45 a.m. for continued cardiac management and followup      post cath.  2. The patient will follow up with Dr. Caron Presume her primary care      physician for continued medical management and diabetes management.  3. The patient has been given post cardiac catheterization      instructions with particular emphasis on the right groin site to      look for evidence of bleeding, hematoma, any signs of infection or      swelling.  The patient verbalized      understanding  of these instructions.  The patient has been advised      to bring her medications to the appointment on followup.   Time spent with the patient to include physician time 45 minutes.      Phill Myron. Purcell Nails, NP     KML/MEDQ  D:  01/01/2007  T:  01/01/2007  Job:  [id number]

## 2010-09-10 NOTE — Assessment & Plan Note (Signed)
[place] OFFICE NOTE   NAME:[name], [name]                   MRN:          [id number]  DATE:11/04/2006                            DOB:          July 02, 1940    Ms. Molinero returns for followup today.  She has a history of coronary  artery disease, hypertension, hyperlipidemia, diabetes, and renal  insufficiency.  Since I last saw her she is having intermittent chest  pain.  The pain is in various locations on her chart.  It can be  intermittently sharp or dull.  It is not clearly exertional.  She states  it increases with stress.  It can increase with certain movements, and  also with deep inspiration.  Also note, she had a more prolonged episode  on Saturday associated with a choking sensation, but no water brash.  She continues to have some dyspnea on exertion, but there is no  orthopnea or PND, and there is no pedal edema.   Her medications include:  1. Colace 100 mg p.o. daily.  2. Aspirin 81 mg p.o. daily.  3. Plavix 75 mg p.o. daily.  4. Insulin.  5. Lidoderm patch.  6. Norvasc 10 mg p.o. daily.  7. Imdur 30 mg p.o. daily.  8. Actos 45 mg p.o. daily.  9. Glucotrol 10 mg p.o. b.i.d.  10.Toprol 50 mg p.o. daily.  11.Valium 10 mg p.o. b.i.d. or t.i.d.  12.Glipizide 10 mg p.o. b.i.d.  13.Vasotek 10 mg p.o. daily.   PHYSICAL EXAMINATION:  Shows a blood pressure 149/81.  Pulse is 81.  She  weighs 187 pounds.  HEENT:  Normal.  NECK:  Supple.  CHEST:  Clear.  CARDIOVASCULAR EXAM:  Regular rate and rhythm.  ABDOMINAL EXAM:  Benign.  EXTREMITIES:  Showed no edema.  ELECTROCARDIOGRAM:  Shows a sinus rhythm a rate of 78.  There is prior  inferior posterior infarct, and there is subtle anterior T wave  inversions.  Note, this has been present on previous electrocardiograms.   DIAGNOSES:  1. Chest pain:  Some of her description is atypical, as it occurs in      various locations, and also increases  with certain movements.      However, given her history of coronary artery disease, we will risk      stratify with an adenosine Myoview.  If it shows no ischemia, then      we will plan continued medical therapy.  2. Coronary artery disease:  She will continue on her aspirin, Plavix,      statin, and beta blocker, as well as ACE inhibitor.  3. Hyperlipidemia:  We will check lipids and liver today and adjust      her regimen as indicated.  She will continue on her statin.  4. Diabetes mellitus:  Per her primary care physician.  5. Renal insufficiency.  We will check a BMET today to follow up      potassium and renal function.  6. Peripheral vascular disease.   I will see her back in 3 months.     Denice Bors Stanford Breed, MD, [facility]  Electronically Signed    BSC/MedQ  DD: 11/04/2006  DT: 11/04/2006  Job #: [id number]   cc:   Bonne Dolores, M.D.

## 2010-09-10 NOTE — Assessment & Plan Note (Signed)
[facility]                            CARDIOLOGY OFFICE NOTE   NAME:[name], [name]                   MRN:          [id number]  DATE:12/18/2006                            DOB:          04/13/1941    [name] returns for followup today.  She has a history of coronary  disease.  She underwent cardiac catheterization in May of 2007.  At that  time, she was found to have luminal irregularities in her LAD and there  was a 30-40% diagonal.  The circumflex provided 2 marginal branches.  There was an 80% first marginal and a 70-80% second marginal.  Both were  less than 2 mm.  The third marginal was totally occluded.  The right  coronary artery had an acute marginal with a 70% osteal lesion.  The PDA  was without critical obstruction and had a 50-70% narrowing in the  posterior lateral.  It was felt that medical therapy was indicated.  She  recently was complaining of chest pain, which has been somewhat of a  problem for her.  She occasionally feels nudges in various locations  on her chest.  However, she had a more severe episode several weeks ago.  We scheduled her to have a Myoview, which was performed on November 17, 2006.  It showed an ejection fraction of 72%.  There was mild to  moderate ischemia in the inferolateral wall.  Since then, she continues  to have an occasional nudge, but she has had no further prolonged chest  pain.   MEDICATIONS:  1. Aspirin 81 mg p.o. daily.  2. Plavix 75 mg p.o. daily.  3. Lidoderm patch.  4. Norvasc 10 mg p.o. daily.  5. Imdur 30 mg p.o. daily.  6. Actos 45 mg p.o. daily.  7. Diovan 10 mg p.o. b.i.d. or t.i.d.  8. Glipizide 10 mg p.o. b.i.d.  9. Vasotec 10 mg p.o. daily.  10.Lipitor 80 mg p.o. daily.  11.Metoprolol 50 mg p.o. daily.   PHYSICAL EXAM:  Blood pressure 154/76 and her pulse is 70.  She weighs  183 pounds.  HEENT:  Normal.  NECK:  Supple.  CHEST:  Clear.  CARDIOVASCULAR:  Regular rate and  rhythm.  ABDOMEN:  Benign.  EXTREMITIES:  No edema.   ELECTROCARDIOGRAM:  Sinus rhythm at a rate of 69.  There is subtle  anterior T wave inversion, which is unchanged from previous.   DIAGNOSES:  1. Abnormal nuclear study - I have reviewed the study with Mrs.      Hairgrove and her son today.  Some of this inferolateral defect      would most likely be expected given her marginal disease.  We have      elected to treat this medically previously.  It is also not clear      to me that all of her pains are coming from her heart.  Our options      would be to continue with medical therapy versus proceeding with      cardiac catheterization.  The risks and benefits of catheterization  have been discussed, including renal insufficiency given her      diabetes mellitus and baseline elevated creatinine.  She would [name]      to discuss this with her husband and we will have her come back in      the next 2 weeks to review this with him.  They will make a final      decision at that time.  Hopefully, if we do proceed with      catheterization, we could find the lesion amenable to percutaneous      coronary intervention.  I have instructed her that, if she has any      further prolonged chest pain that she should be seen in the      emergency room.  2. Chest pain - as per above.  3. Coronary artery disease - she will continue on her aspirin, Plavix,      Statin, beta blocker, and ACE inhibitor.  4. Hypertension - her blood pressure is mildly elevated today, but we      will continue to track this and increase her medicines as      indicated.  5. Hyperlipidemia - she will continue on her Statin.  Her recent      lipids showed an LDL of 88 and a triglyceride level of 256.  She      has been referred to the lipid clinic.  Her alkaline phosphatase is      minimally elevated, which may be related to her renal      insufficiency.  However, we will check a 5-prime nucleotide as in a      GGT.   6. Diabetes mellitus - per primary care.  7. Renal insufficiency - a recent BMET showed a creatinine of 1.5.  8. Peripheral vascular disease.   We will see her back in 2 weeks.     Denice Bors Stanford Breed, MD, [facility]  Electronically Signed    BSC/MedQ  DD: 12/18/2006  DT: 12/19/2006  Job #: [id number]   cc:   Bonne Dolores, M.D.

## 2010-09-10 NOTE — Assessment & Plan Note (Signed)
[place] OFFICE NOTE   NAME:[name], [name] BROSSETT                   MRN:          [id number]  DATE:12/29/2006                            DOB:          Mar 21, 1941    HISTORY OF PRESENT ILLNESS:  [name] returns for follow up today.  She has a history of coronary disease.  She has had previous  catheterization in May 2007.  At that time, she was found to have  significant disease in her marginals (There was an 80% first marginal  and a 70-80% second marginal and a third marginal was totally occluded.  There was a 50-70% posterior lateral.  It was felt that medical therapy  was indicated.  We recently scheduled her for a stress test as she was  having increasing chest pain.  This was performed on November 17, 2006, and  showed mild to moderate ischemia in the inferior lateral wall with an  ejection fraction of 72%.  We had been discussing the options including  continuing medical therapy versus cardiac catheterization.  Note, the  area of ischemia most likely correlates with her previous marginal  disease.  She returns with her husband today for further discussion.  Note, she has had two separate episodes of chest pain requiring  nitroglycerin since I saw her previously.  She has not had any in the  past week.  She does have dyspnea on exertion.   MEDICATIONS:  1. Aspirin 81 mg p.o. daily.  2. Plavix 75 mg p.o. daily.  3. Insulin.  4. Lidoderm patch.  5. Norvasc 10 mg p.o. daily.  6. Imdur 30 mg p.o. daily .  7. Actos 45 mg p.o. daily.  8 . Valium 10 mg p.o. t.i.d. or b.i.d.  1. Glipizide 10 mg p.o. b.i.d.  2. Vasotec 10 mg p.o. daily.  3. Lipitor 80 mg p.o. daily.  4. Toprol 50 mg p.o. daily.   ALLERGIES:  PENICILLIN, KEFLEX, IVP DYE.   SOCIAL HISTORY:  She has discontinued her tobacco use.   PAST MEDICAL HISTORY:  1. Significant for diabetes mellitus, hypertension and hyperlipidemia.  2. She has  coronary disease.  3. History of renal insufficiency.   PHYSICAL EXAMINATION:  VITAL SIGNS:  Blood pressure 160/87, pulse 84,  weight 186 pounds.  HEENT:  Normal.  NECK:  Supple.  CHEST:  Clear.  CARDIOVASCULAR:  Regular rate and rhythm.  ABDOMEN:  Benign.  EXTREMITIES:  Trace edema.   STUDIES:  Electrocardiogram today shows a sinus rhythm at a rate of 85.  The axis is normal.  There are nonspecific ST changes.   DIAGNOSES:  1. Continuing chest pain with abnormal nuclear study - I have had long      discussions with the patient, her husband and her son.  I think      some of her inferolateral defect on her previous Myoview would be      expected given her marginal disease.  However, she has continued to      have chest pains.  All of these are not clearly related to her  cardiac status in my opinion, but certainly some of them could be.      I therefore think we should proceed with cardiac catheterization      for definitive evaluation.  Risks and benefits have been discussed      including renal insufficiency, and she has agreed to proceed.      Given her mild renal insufficiency and diabetes mellitus, we will      plan to admit the patient the night before for IV hydration to      decrease the chances of contrast nephropathy.  We will also need to      premedicate for dye allergy.  2. Coronary artery disease - we will continue with her aspirin,      Plavix, statin, beta blocker and ACE inhibitor.  3. Hypertension - her blood pressure is mildly elevated.  We will      track this and increase her medications as indicated  4. Hyperlipidemia.  She is being followed in the lipid clinic now.      Note, her alk-phos has been minimally elevated, but her GGT and her      nucleotides were normal.  5. Diabetes mellitus - per primary care.  6. Renal insufficiency.  7. History of dye allergy.  8. Peripheral vascular disease.   We will see her back in 6-8 weeks after her cardiac  catheterization .     [name] Bors Stanford Breed, MD, [facility]  Electronically Signed    BSC/MedQ  DD: 12/29/2006  DT: 12/29/2006  Job #: [id number]   cc:   Bonne Dolores, M.D.

## 2010-09-10 NOTE — Discharge Summary (Signed)
NAME:  [name], [name]            ACCOUNT NO.:  [id number]   MEDICAL RECORD NO.:  [id number]          PATIENT TYPE:  INP   LOCATION:  2036                         FACILITY:  [facility]   PHYSICIAN:  Denice Bors. Stanford Breed, MD, FACCDATE OF BIRTH:  1940-05-28   DATE OF ADMISSION:  12/30/2006  DATE OF DISCHARGE:  01/01/2007                               DISCHARGE SUMMARY   ADDENDUM   This is an addendum to a previously dictated discharge summary on  September 5, and that job number was [id number].   Because of elevated creatinine on September 5, up to 1.80 from 1.48 on  admission, the decision was made to observe [name] for an  additional 24 hours.  She had no chest pain or shortness of breath in  that period of time, and repeat blood chemistry this morning revealed a  BUN of 34, with a creatinine of 1.57.  She was felt to be stable for  discharge this morning and we will have her follow up with a blood  chemistry on September 8 in our office.  We will also hold her Vasotec  at least until she follows up with Dr. Stanford Breed on September 29.  Otherwise, her medication list will remain as previously dictated.   OUTSTANDING LABORATORY STUDIES:  None.   DICTATION NOTE:  Duration of this discharge encounter = 30 minutes  including physician time.      Murray Hodgkins, ANP      Enon Stanford Breed, MD, [facility]  Electronically Signed    CB/MEDQ  D:  01/02/2007  T:  01/02/2007  Job:  [id number]   cc:   Bonne Dolores, M.D.

## 2010-09-10 NOTE — Assessment & Plan Note (Signed)
[facility] OFFICE NOTE   NAME:Sandler, ANGELISA RAMPONE                   MRN:          [id number]  DATE:03/14/2008                            DOB:          1941/01/22    Ms. Mangrum returns for followup today.  She has a history of coronary  artery disease with her last catheterization in September 2008.  At that  time, she was found to have significant left circumflex disease as well  as marginal disease to correlate with perfusion abnormality on her  Myoview.  However, it was felt that these are small vessels and would be  difficult to intervene on either percutaneously or from a surgical  standpoint.  Her only other disease was in the PDA at 50-70%.  She has  been treated medically.  Since I last saw her she does occasionally have  chest pain.  This has been a chronic issue for her.  It is in the  substernal area.  Indications, it radiates to her back.  Does not  associated with nausea, vomiting, or diaphoresis, but there can be  shortness of breath.  Does improve with nitroglycerin.  She has had no  prolonged episodes.  She does have some dyspnea on exertion.  There is  no orthopnea, PND. or pedal edema.   MEDICATIONS:  1. Zyrtec.  2. Enalapril 10 mg p.o. b.i.d.  3. Amlodipine 10 mg p.o. daily.  4. Aspirin 81 mg p.o. daily.  5. Plavix 75 mg p.o. daily.  6. Insulin.  7. Lidoderm.  8. Imdur 30 mg p.o. daily.  9. Glipizide 2 mg p.o. b.i.d.  10.Lipitor 80 mg p.o. daily.  11.Toprol 50 mg p.o. daily.  12.Stool softener.   PHYSICAL EXAMINATION:  VITAL SIGNS:  Blood pressure of 150/80 and pulse  is 66.  HEENT:  Normal.  NECK:  Supple.  CHEST:  Clear.  CARDIOVASCULAR:  Regular rate and rhythm.  ABDOMEN:  No tenderness.  EXTREMITIES:  No edema.   Electrocardiogram shows a sinus rhythm at a rate of 67.  There are  nonspecific anterior T-wave changes.  It is unchanged from previous.   DIAGNOSES:  1. Coronary  artery disease - we will continue with her aspirin,      Plavix, statin, beta-blocker, and angiotensin-converting enzyme      inhibitor.  2. Chest pain - her symptoms appear to be unchanged from previous.  We      will continue with medical therapy in this regard.  She will      continue on her Imdur and Norvasc.  3. Hyperlipidemia - she will continue on her statin.  We will check      lipids and liver and adjust as indicated.  4. Diabetes mellitus - managed per her primary care physician.  5. Cerebrovascular disease - she will need follow up carotid Dopplers      in June 2010.  She will continue on aspirin and statin.  6. Renal insufficiency - we will check BMET today.  7. Hypertension - her blood pressure mildly elevated.  It typically      runs  in the 140/80 range.  We will make no adjustments, but we may      increase her enalapril in the future if needed.  8. History of DYE allergy.   I will see her back in 6 months.     Denice Bors Stanford Breed, MD, [facility]  Electronically Signed    BSC/MedQ  DD: 03/14/2008  DT: 03/14/2008  Job #: [id number]   cc:   Bonne Dolores, M.D.

## 2010-09-10 NOTE — Op Note (Signed)
NAME:  [name], [name]            ACCOUNT NO.:  [id number]   MEDICAL RECORD NO.:  [id number]          PATIENT TYPE:  AMB   LOCATION:  DAY                           FACILITY:  [facility]   PHYSICIAN:  R. Garfield Cornea, M.D. DATE OF BIRTH:  [birth year]   DATE OF PROCEDURE:  09/26/2008  DATE OF DISCHARGE:                               OPERATIVE REPORT   PROCEDURE PERFORMED:  Colonoscopy with snare polypectomy biopsy.   INDICATIONS FOR PROCEDURE:  A 71 year old lady who has never had her  lower GI tract evaluated is sent over courtesy of [facility].  Family history is significant.  She did have an aunt with  colon cancer.  Her son has had colonic polyps resected.  Colonoscopy is  now being done.  Risks, benefits, alternatives and limitations have been  reviewed, questions answered.  Please see the documentation in the  medical record.   PROCEDURE NOTE:  O2 saturation, blood pressure, pulse, respirations  monitored throughout entire procedure.   CONSCIOUS SEDATION:  Versed 7 mg IV, Demerol 150 mg IV in divided doses.   INSTRUMENT:  Pentax video chip system.   FINDINGS:  1. Digital rectal exam revealed no abnormalities.  2. Endoscopic:  The prep was adequate and was relatively poor on the      right side.  3. Colon :  Colonic mucosa was surveyed from the rectosigmoid junction      to the left transverse, right colon and the appendiceal orifice,      ileocecal valve and cecum.  These structures were well seen and      photographed for the record.  From this level the scope was slowly      and cautiously withdrawn.  All previous mentioned mucosal surfaces      were again seen.  There was a 5-mm polyp on the distal side of the      ileocecal valve which was cold snared.  There was a similar size      sigmoid polyp which was cold snared and an adjacent diminutive      polyp which was cold biopsied.  At the rectosigmoid there was a      single diminutive polyp which was  biopsied.  The patient did have      scattered pan colonic diverticula.  The remaining colonic mucosa      appeared normal although the poor prep on the right side made exam      somewhat more difficult.  The scope was pulled down in the rectum      where thorough examination of the rectal mucosa including      retroflexion in the anal verge demonstrated no abnormalities.  The      patient tolerated the procedure well and was reactive in endoscopy.      Cecal withdrawal time 13 minutes.   IMPRESSION:  1. Normal rectum.  2. Diminutive rectosigmoid polyp, cold biopsy removed.  3. Sigmoid polyp  removed as described above.  4. Polyp in the ileocecal valve removed with snare polypectomy.  5. Pan colonic diverticula.   RECOMMENDATIONS:  1. Benefiber  1 tablespoon daily.  2. MiraLax 17 grams orally at bedtime p.r.n. constipation.  3. Follow up on path.  4. Further recommendations to follow.      Bridgette Habermann, M.D.  Electronically Signed     RMR/MEDQ  D:  09/26/2008  T:  09/26/2008  Job:  [id number]   cc:   Carl

## 2010-09-13 NOTE — H&P (Signed)
NAME:  [name], [name]                      ACCOUNT NO.:  [id number]   MEDICAL RECORD NO.:  [id number]                   PATIENT TYPE:  OUT   LOCATION:  RDC                                  FACILITY:  [facility]   PHYSICIAN:  Bonne Dolores, M.D.                 DATE OF BIRTH:  [birth year]   DATE OF ADMISSION:  06/27/2003  DATE OF DISCHARGE:                                HISTORY & PHYSICAL   CHIEF COMPLAINT:  Shortness of breath and pleuritic chest pain.   HISTORY OF PRESENT ILLNESS:  This is a 70 year old female with a history of  diabetes mellitus type 2, status post cerebrovascular accident in the remote  past and severe bronchitis/COPD.  She was admitted approximately one year  ago with refractory exacerbation of COPD and responded after several days of  therapy.   The patient has been quite stable at home until approximately two weeks ago.  She was treated in the office with bronchitic syndrome.  She improved until  approximately 24 hours ago when she developed left pleuritic chest pain  posteriorly.  She has had some cough and mild dyspnea on exertion as well as  mild orthopnea.  She denies exertional chest pain or radiation of pain.   An evaluation in the office revealed an 02 saturation of 92%.  A chest x-ray  showed a probable infiltrate versus effusion in the left lower lobe.   The patient is admitted with pleuritic chest pain and apparent  effusion/pneumonia, consider other etiology.   There is no history of headache, neurologic deficits, nausea, vomiting or  diarrhea.  She has had some anorexia.  No melena, hematemesis or  hematochezia have been noted.  She also denies hemoptysis, true rigor or  significant sputum production.   CURRENT MEDICATIONS:  1. Diazepam 10 mg t.i.d. p.r.n.  2. Vasotec 10 mg b.i.d.  3. Trandate 200  mg b.i.d.  4. Hydrochlorothiazide 25 mg daily.  5. Norvasc 5 mg daily.  6. Glucotrol XL 10 mg b.i.d.  7. Actos 15 mg daily.  8. P.r.n.  nitroglycerin.   ALLERGIES:  1. KEFLEX.  2. PENICILLIN.  3. Some history of LEVAQUIN allergy although the patient has tolerated this     and other quinolones in the recent past.   PAST MEDICAL HISTORY:  As noted above.   FAMILY HISTORY:  Noncontributory.   REVIEW OF SYMPTOMS:  Negative except as mentioned.   PHYSICAL EXAMINATION:  GENERAL:  This is a very pleasant female who is  somewhat pale, but is alert and in no acute distress.  VITAL SIGNS:  Temperature is 99.2, heart rate is 78 and regular,  respirations are 18 and unlabored, blood pressure is 120/70.  HEENT:  Normocephalic, atraumatic.  Pupils are equal.  The nose and throat  are benign.  Neck is supple.  There are no bruits.  LUNGS:  Some diminished breath sounds in the base.  The patient has  difficulty taking a deep breath due to pleuritic pain.  CARDIOVASCULAR:  Heart sounds are distant.  No murmurs, rubs or gallops are  noted.  ABDOMEN:  Non-tender and non-distended.  Bowel sounds are active.  EXTREMITIES:  No cyanosis, clubbing or edema.   ASSESSMENT:  Pneumonia versus pleural effusion noted on chest x-ray with  symptom complex as noted above; consider peripneumonic effusion, empyema,  pulmonary embolism or other.   PLAN:  Admit for hydration, broad spectrum antibiotics, appropriate cultures  and CT scan of chest stat.  We will follow and treat.     ___________________________________________                                         Bonne Dolores, M.D.   MC/MEDQ  D:  06/27/2003  T:  06/27/2003  Job:  [id number]

## 2010-09-13 NOTE — Discharge Summary (Signed)
NAME:  [name], [name]                      ACCOUNT NO.:  [id number]   MEDICAL RECORD NO.:  [id number]                   PATIENT TYPE:  INP   LOCATION:  A220                                 FACILITY:  [facility]   PHYSICIAN:  Bonne Dolores, M.D.                 DATE OF BIRTH:  January 29, 1941   DATE OF ADMISSION:  06/27/2003  DATE OF DISCHARGE:  07/02/2003                                 DISCHARGE SUMMARY   DISCHARGE DIAGNOSES:  1. Right lower-lobe pneumonia, improved.  2. History of chronic obstructive pulmonary disease with probably frequent     exacerbations.  3. Diabetes mellitus type 2.  4. Status post cerebrovascular accident in the remote past.  5. Severe hypertension.   ALLERGIES:  KEFLEX AND PENICILLIN.   HISTORY OF PRESENT ILLNESS:  For details regarding admission, please refer  to the admitting note.  Briefly, this 70 year old female presented with the  above history.  She had been treated as an outpatient for bronchitic  syndrome approximately two weeks prior.  She did improve, but she regressed  and presented to the office with a 24-hour history of severe left pleuritic  chest pain.  In the office, O2 saturation was 92%.  A chest x-ray showed a  probable infiltrate versus effusion in the left-lower lobe.  She was  admitted with pleuritic chest pain and apparent effusion/ pneumonia.   HOSPITAL COURSE:  The patient was treated with broad-spectrum antibiotics.  CT scan was given without contrast due to allergy to the dye.  It revealed  volume loss and consolidation of the left lower lobe.   She continued to improve with antibiotics and symptomatic therapy.  She was  stable for discharge on the 5th hospital day.   MEDICATIONS:  1. Levaquin 500 mg daily.  2. She is to continue her home medications which are diazepam 10 t.i.d.     p.r.n., Vasotec 10 b.i.d., Trandate 200 mg b.i.d., hydrochlorothiazide 25     daily, Norvasc 5 daily, Glucotrol XL 10 mg b.i.d., Actos 15 mg daily  and     p.r.n. nitroglycerin.     ___________________________________________                                         Bonne Dolores, M.D.   MC/MEDQ  D:  07/23/2003  T:  07/23/2003  Job:  [id number]

## 2010-09-13 NOTE — Assessment & Plan Note (Signed)
[facility]                              CARDIOLOGY OFFICE NOTE   NAME:Parsley, SHAKEITHA HERBIN                   MRN:          [id number]  DATE:11/25/2005                            DOB:          [birth year]    REASON FOR VISIT:  Ms. Kuehl returns for followup today.  She has a  history of coronary artery disease and hypertension.  Since I last saw her,  she has been tracking her blood pressure, and it is much improved, with  systolic in the A999333 range and diastolic in the 99991111 range.  She denies  any dyspnea.  She occasionally feels a brief episode of chest pain but  nothing similar to her infarct pain.   MEDICATIONS:  1.  Vasotec 20 mg p.o. b.i.d.  2.  Colace 100 mg p.o. daily.  3.  Aspirin 81 mg p.o. daily.  4.  Lipitor 80 mg p.o. daily.  5.  Plavix 75 mg p.o. daily.  6.  Insulin.  7.  Lidoderm patch.  8.  Norvasc 10 mg p.o. daily.  9.  Hydrochlorothiazide 12.5 p.o. daily.  10. Isosorbide 30 mg p.o. daily.  11. Actos.  12. Glucotrol 10 mg p.o. b.i.d.  13. Toprol-XL 50 mg p.o. b.i.d.   PHYSICAL EXAMINATION:  VITAL SIGNS:  Blood pressure 136/62, pulse 71.  CHEST:  Clear.  CARDIOVASCULAR:  Regular rate and rhythm.  EXTREMITIES:  No edema.   DIAGNOSTIC STUDIES:  Her electrocardiogram shows a sinus rhythm at a rate of  71.  There is a RV conduction delay and subtle anterior T wave inversions  noted.   DIAGNOSES:  1.  Coronary artery disease.  2.  Hypertension.  3.  Hyperlipidemia.  4.  Diabetes mellitus.  5.  Renal insufficiency.  6.  History of peripheral vascular disease.   PLAN:  Ms. Santoro is doing well from a symptomatic standpoint.  We will  plan to repeat her BMET today to follow potassium and renal function.  She  will otherwise continue with her risk  factor modifications.  Note, her recent carotid Dopplers showed  nonobstructive disease.  She will see [place] back in six months.                              Denice Bors  Stanford Breed, MD, [facility]    BSC/MedQ  DD:  11/25/2005  DT:  11/25/2005  Job #:  [id number]   cc:   Bonne Dolores, MD

## 2010-09-13 NOTE — Discharge Summary (Signed)
   NAME:  [name], [name]                      ACCOUNT NO.:  [id number]   MEDICAL RECORD NO.:  [id number]                   PATIENT TYPE:  INP   LOCATION:  A312                                 FACILITY:  [facility]   PHYSICIAN:  Bonne Dolores, M.D.                 DATE OF BIRTH:  1941-03-20   DATE OF ADMISSION:  03/19/2002  DATE OF DISCHARGE:  03/25/2002                                 DISCHARGE SUMMARY   DISCHARGE DIAGNOSES:  1. Severe bronchitis/asthmatic component, refractory but finally responsive     to therapy.  2. Long standing severe hypertension.  3. Diabetes mellitus type II with reasonable control.  4. History of cardiovascular accident.  5. Mild dehydration at admission.  6. Underlying chronic obstructive pulmonary disease.   HISTORY OF PRESENT ILLNESS:  For details regarding admission, please refer  to the admission note by Dr. Nelta Numbers. Briefly, this 70 year old female with a  history of chronic obstructive pulmonary disease and diabetes mellitus, etc,  presented to our office two weeks prior to admission. She was given  antibiotics and other respiratory medications. She failed to respond and  came to the emergency room the day of admission. In the emergency room she  was found to have bilateral wheezes and crackles. Chest x-ray did not show  any acute infiltrate. Lab was reasonable with O2 sat of 93.7. She was  admitted for exacerbation of chronic obstructive pulmonary disease  refractory to outpatient therapy.   HOSPITAL COURSE:  The patient was routinely administered routine respiratory  nebs and steroids and IV antibiotics as well as IV Theophylline. She did  respond somewhat but was slow to improve. Finally, her symptoms began to  clear and she was converted to by mouth fluids and did very well for 24  hours and was stable for discharge.   DISCHARGE MEDICATIONS:  1. Theo-Dur 200 mg by mouth twice a day.  2. Prednisone 10 mg three times a day (to be tapered).  3.  Pepcid 20 mg twice a day.  4. Lopressor 50 mg twice a day.  5. Levaquin 500 mg each day times seven days.  6. Glucotrol 10 mg each day.  7. Actos 30 mg each day.  8.     Tylox as needed.  9. Albuterol and Atrovent nebs as needed.   FOLLOW UP:  She will be followed and treated expectantly as an outpatient.                                               Bonne Dolores, M.D.    MC/MEDQ  D:  06/26/2002  T:  06/26/2002  Job:  [id number]   cc:   Naomie Dean, M.D.

## 2010-09-13 NOTE — H&P (Signed)
NAME:  [name], [name]                      ACCOUNT NO.:  [id number]   MEDICAL RECORD NO.:  [id number]                   PATIENT TYPE:  INP   LOCATION:  A312                                 FACILITY:  [facility]   PHYSICIAN:  Naomie Dean, M.D.                  DATE OF BIRTH:  Oct 17, 1940   DATE OF ADMISSION:  03/19/2002  DATE OF DISCHARGE:                                HISTORY & PHYSICAL   PRIMARY CARE PHYSICIAN:  Bonne Dolores, M.D.   CHIEF COMPLAINT:  I feel ill and I've been coughing for 2 weeks.   HISTORY OF PRESENT ILLNESS:  The patient is a 70 year old lady with a  history of COPD and diabetes mellitus type 2.  She was apparently well until  about 2 weeks ago when she developed malaise associated with cough and  shortness of breath.  The patient was seen by her primary MD who prescribed  oral antibiotics however, her condition continued to deteriorate.  She saw  primary MD again this morning who recommended that she come to the emergency  room for evaluation.   REVIEW OF SYSTEMS:  GENERAL:   She admits to weakness, she has a fever, admits  to headache, dry throat and difficulty swallowing.  CHEST: She admits to  shortness of breath, a cough and a _____ chest pain.  CARDIOVASCULAR: Denies  any palpitations.  CENTRAL NERVOUS SYSTEM: She complains of a slight left-  sided weakness from the CVA.  Denies any dizziness.  EXTREMITIES: No edema.  MUSCULOSKELETAL: No back pain.  GI: Denies any nausea, vomiting, or  diarrhea.   PAST MEDICAL HISTORY:  COPD, diabetes mellitus type 2, hypertension, a  history of CVA with slight left hemiparesis.  She has a remote history of  exploratory laparotomy for which a gallbladder problem was found.   MEDICATIONS:  She is not sure at this time but she thinks she was prescribed  Biaxin.  She thinks she is on Vasotec, Actos, and Glucotrol for her  diabetes.   ALLERGIES:  She says she is allergic to PENICILLIN and also to CONTRAST DYE.   FAMILY  HISTORY:  Her father died of an acute MI.   SOCIAL HISTORY:  She is married.  She lives with her husband.  She has two  sons.  She smokes cigarettes and she does not drink alcohol.   PHYSICAL EXAMINATION:  VITAL SIGNS: Her blood pressure is 164/80 with a  heart rate of 108, respiratory rate of 8, and a temperature of 100.4.  GENERAL: An elderly lady lying comfortably on a stretcher not in any  apparent distress.  HEAD/EAR/NOSE AND THROAT: She is not pale.  She is anicteric.  Pupils are  equal and reactive to light and accommodation.  She has dry oral mucosa.  She has no hyperemia of the oropharynx.  NECK: Supple.  There are no lymphadenopathies and no jugular venous  distention.  CHEST: She has bilateral  expiratory wheezes diffusely.  No crackles are  heard, however, air entry appears reduced bibasally.  CVS: Heart sounds 1 and 2 are normal of regular rhythm and rate.  No murmurs  are appreciated.  PMI cannot be localized.  ABDOMEN: Obese, bowel sounds are present, soft, nontender.  She has a  surgical scar.  EXTREMITIES: She has no pedal edema.   LABORATORY DATA:  Her lab data shows a WBC of 10.2, hemoglobin of 11.3,  hematocrit of 32.5, MCV of 85.9, a platelet count of 269.  Her ABG shows a  pH of 7.40, pCO2 of 35.9, pO2 of 60.0, bicarb of 21.9, and oxygen saturation  of 93.2%.  Sodium is 157, potassium is 4.0, chloride is 102, CO2 is 28, BUN  is 19, creatinine is 1.6, glucose is 156, calcium is 9.6.  A chest x-ray did  not show any acute infiltrate just shows bibasilar atelectasis.   ASSESSMENT:  Chronic obstructive pulmonary disease exacerbation with  possible upper airway infection.   PLAN:  Problem 1. The patient will be admitted to primary MD, Dr. Caron Presume.  The patient had been started on IV Levaquin from the emergency room which  will be continued 500 mg q.d.  She will be given IV Solu-Medrol 125 q.12h.  and she will also be given oxygen via nasal cannula 2 L/min.   Albuterol and  Atrovent nebulizers will be given q.4h.  Blood cultures and sputum cultures  will also be drawn and a repeat blood gas will be done later to monitor her  pCO2 and oxygen.   Problem 2. For the diabetes mellitus, the patient will resume Glucotrol 10  mg q.d.  She will also be given Actos and she will be put on a chemstrip  with regular insulin coverage q.6h.   Problem 3. For the hypertension, she will be started on labetalol 100 mg  q.d. and her blood pressure will be monitored continuously.   Problem 4. For the history of CVA, the patient will be given aspirin 81 mg  p.o. q.d.   Problem 5. For the dehydration, she will be given IV normal saline at 100  cc/hr.  Further management will also depend on the patient's clinical  course.                                               Naomie Dean, M.D.    DW/MEDQ  D:  03/19/2002  T:  03/19/2002  Job:  [id number]

## 2010-09-13 NOTE — Discharge Summary (Signed)
NAME:  [name], [name]            ACCOUNT NO.:  [id number]   MEDICAL RECORD NO.:  [id number]          PATIENT TYPE:  INP   LOCATION:  2903                         FACILITY:  [facility]   PHYSICIAN:  Kirk Ruths, M.D. [facility] OF BIRTH:  [birth year]   DATE OF ADMISSION:  08/30/2005  DATE OF DISCHARGE:  09/03/2005                                 DISCHARGE SUMMARY   PRIMARY CARE PHYSICIAN:  Dr. Bonne Dolores.   PRIMARY CARDIOLOGIST:  (will be) Dr. Kirk Ruths   PRINCIPAL DIAGNOSIS:  1.  Non ST elevation myocardial infarction.  2.  Hypertension.  3.  Type 2 diabetes mellitus.  4.  Obesity.  5.  Cigarette abuse.  6.  Peripheral vascular disease with claudication.  7.  A history of cerebrovascular accident.  8.  Anemia.  9.  Chronic renal insufficiency.  10. History of kidney stones.  11. Status post hysterectomy.  12. Status post cholecystectomy.  13. Status post hernia repair.   ALLERGIES:  IV CONTRAST.   PROCEDURE:  1.  Left heart cardiac catheterization.  2.  2-D echocardiogram   HISTORY OF PRESENT ILLNESS:  A 70 year old who was admitted to the hospital  on Aug 30, 2005 after seeing Dr. Caron Presume earlier in the day with complaints  of an episode of chest discomfort that occurred approximately one week prior  to admission associated with shortness of breath and inability to sleep.  She had intermittent recurrent chest discomfort over the week prior to  admission and an ECG was performed in Dr. Hulen Luster office and was  concerning for acute changes.  She she was subsequently referred to [facility] for further evaluation.   HOSPITAL COURSE:  [name] ruled in for a non ST elevation MI with a  peak CK of 70, MB of 1.5, and troponin I of 3.11.  It was felt that this  likely occurred out of hospital one week prior to admission.  She was placed  on Integrilin and heparin as well as aspirin and Statin therapy.  Following  admission she had no recurrent chest discomfort and  was scheduled for  cardiac catheterization on Sep 01, 2005.  Secondary to dye allergy she was  pre medicated with methylprednisolone as well as Pepcid.  Catheterization  revealed diffuse small vessel disease with a total occlusion of the left  circumflex and 80% stenoses in the first and second obtuse marginals.  There  is also a 70% stenosis in the proximal PDA which again was a small vessel.  It was felt that she should be medically managed.  Since her catheterization  she has not had any recurrent chest discomfort.  A 2-D echocardiogram was  performed revealing an EF of approximately 60%.  She has been seen by the  cardiac rehabilitation team and has been ambulating with some improvement.  She is being discharged home today in satisfactory condition.   DISCHARGE LABORATORIES:  Hemoglobin 10.4, hematocrit 30, WBC 12.6, platelets  312, MCV 87.9.  Sodium 137, potassium 3.9, chloride 103, CO2 27, BUN 24,  creatinine 1.4, glucose 130, total bilirubin 0.8, alkaline phosphatase 78,  AST 16, ALT  16, albumin 3.4.  Peak CK 70, peak MB 1.5, peak troponin I 3.11.  Total cholesterol 233, triglycerides 299, HDL 34, LDL 139, calcium 9.  Hemoglobin A1c 6.9.  TSH is 0.851.   DISPOSITION:  Patient is being discharged home today in good condition.   FOLLOW-UP PLANS AND APPOINTMENTS:  She is asked to follow up with primary  care physician, Dr. Bonne Dolores, in approximately three to four weeks.  She has a follow-up appointment with Dr. Kirk Ruths at [facility] in [place] on May 22 at 9:45 a.m.   DISCHARGE MEDICATIONS:  1.  Aspirin 81 mg daily.  2.  Enalapril 10 mg q.12h.  3.  Lipitor 80 mg q.h.s.  4.  Glucotrol XL b.i.d.  5.  Plavix 75 mg daily.  6.  Toprol XL 50 mg one and a half tabs daily.  7.  Norvasc 10 mg daily.  8.  Nitroglycerin 0.4 mg sublingual p.r.n. chest pain.   OUTSTANDING LABORATORY STUDIES:  Patient will need repeat lipids and LFTs in  approximately six to eight  weeks as she has been initiated on Statin  medication.   DURATION OF DISCHARGE ENCOUNTER:  40 minutes including physician time.      Rogelia Mire, NP    ______________________________  Kirk Ruths, M.D. [facility]    CRB/MEDQ  D:  09/03/2005  T:  09/04/2005  Job:  [id number]   cc:   Bonne Dolores, M.D.  Fax: [phone]

## 2010-09-13 NOTE — H&P (Signed)
NAME:  [name], [name]            ACCOUNT NO.:  [id number]   MEDICAL RECORD NO.:  [id number]          PATIENT TYPE:  EMS   LOCATION:  MAJO                         FACILITY:  [facility]   PHYSICIAN:  Deboraha Sprang, M.D.  DATE OF BIRTH:  [birth year]   DATE OF ADMISSION:  08/30/2005  DATE OF DISCHARGE:                                HISTORY & PHYSICAL   [name] is referred from Dr. Caron Presume to the hospital for further  evaluation because she presented to see him today with a history a week ago  of protracted chest discomfort that was associated with shortness of breath  and inability to sleep. She has had recurrent chest discomfort through the  week and the electrocardiogram was concerning, so she is referred.   The related history is primarily as noted previously. The discomfort was  described as an indigestion evolving from her epigastrium into her chest  with radiation to her shoulders bilaterally accompanied by nausea, shortness  of breath, and diaphoresis. It persisted over the entire night about seven  days ago. Since that time she has had recurrent episodes that have been  shorter, often triggered by activity and relieved by rest.   Her cardiac risk factors are notable for:  1.  Hypertension.  2.  Diabetes.  3.  Family history.  4.  Obesity.  5.  Cigarette use.  6.  She also has a history of peripheral vascular disease manifested by      claudication in her lower extremities.  7.  There is a remote history of stroke, the cause of which is not known.   PAST MEDICAL HISTORY:  In addition to the above is notable to for:  1.  IVP dye allergy.  2.  Anemia.  3.  Renal insufficiency.  4.  Obesity.  5.  Kidney stones.   PAST SURGICAL HISTORY:  1.  Hysterectomy.  2.  Cholecystectomy.  3.  Exploratory laparotomy.  4.  Hernia repair.   CURRENT MEDICATIONS:  1.  Norvasc 5 mg b.i.d.  2.  HydroDIURIL 12.5 mg b.i.d.  3.  Trandate 100 mg b.i.d.  4.  Glucotrol 10 b.i.d.  5.   Lopressor 25 mg q.12h.  6.  Vasotec 12 mg q.12h.  7.  Actos and insulin p.r.n.   ALLERGIES:  IVP and PENICILLIN.   SOCIAL HISTORY:  She is married. She has three or four sons. She does not  use alcohol. She stopped smoking earlier this week.   REVIEW OF SYSTEMS:  Arthritis and constipation. Otherwise, it is negative  across all organ systems except as noted previously.   PHYSICAL EXAMINATION:  GENERAL: She is an elderly Caucasian female appearing  older than her stated age of 65.  VITAL SIGNS: Her blood pressure is 125/74, pulse 68.  HEENT: No icterus. No xanthomata.  NECK: The neck veins are about 6-7 cm. Carotids are brisk and full  bilaterally without bruits.  BACK: Without kyphosis or scoliosis.  LUNGS: Basilar crackles.  CARDIOVASCULAR: Heart sound are regular without murmurs or gallops.  ABDOMEN: Soft without active bowel sounds, without midline pulsation or  hepatomegaly. Femoral pulses  2+. Distal pulses only trace. There is no  clubbing or cyanosis. There is trivial edema.  NEUROLOGIC: Grossly normal.  SKIN: Warm and dry.   Electrocardiogram dated today at 14:04 hours demonstrates sinus rhythm at 69  with intervals at 0.18, 0.09, 0.37. There is ST segment elevation in leads  II, III, F, V4, V5, and V6 with ST-segment depression in lead V1 and V2, and  T-wave inversions in V1 through V4.   Electrocardiogram dated earlier today was notable for a similar ST-segment  elevation in the inferior leads. It is less prominent in the lateral leads.  T-wave inversions were noted persistently and ST-segment depression is also  evident.   Laboratories are notable for a troponin of 3, MB fraction normal. Creatinine  was 1.6 with an estimated creatinine clearance of 31, hemoglobin 11.6, blood  sugar 174.   IMPRESSION:  1.  Out of hospital myocardial infarction with a troponin of 3 and normal MB      fraction, probably inferolateral based on her ST elevation.  2.  Recurrent post  infarction angina, currently quiet.  3.  Cardiac risk factors.      1.  Hypertension.      2.  Diabetes.      3.  Family history.      4.  Obesity.      5.  Cigarette use.  4.  Probable dyslipidemia.  5.  Peripheral claudication with a history of difficult cath access.  6.  Prior transient ischemic attack.  7.  Renal insufficiency with a creatinine of 31.  8.  IVP dye allergy.   [name] has significant comorbidity, but presents with an out of the  hospital MI and recurrent post infarction angina. We will try to medically  quiet her with anticoagulation therapy and augment a beta blockade. She will  need catheterization, although her renal insufficiency and IVP dye allergy  would make it ideal if we could postpone this for right now.   Based on the above, we will therefore:  1.  Admit.  2.  Integrilin and heparin.  3.  Aspirin, Lopressor, and nitroglycerin.  4.  Catheterization on Monday if can be deferred.  5.  Hemoglobin A1c/fasting lipid profile/TSH.  6.  Lipitor 80.  7.  Will need hydration prior to catheterization.           ______________________________  Deboraha Sprang, M.D.     SCK/MEDQ  D:  08/30/2005  T:  08/30/2005  Job:  [id number]   cc:   Bonne Dolores, M.D.  Fax: [facility]  [place], [place]

## 2010-09-13 NOTE — Assessment & Plan Note (Signed)
[facility]                            CARDIOLOGY OFFICE NOTE   NAME:[name]                   MRN:          [id number]  DATE:05/06/2006                            DOB:          December 29, 1940    [name] returns for followup today.  Since I last saw her, she  does have dyspnea on exertion, which is a chronic issue.  There is no  orthopnea or PND, and there is no pedal edema.  She occasionally feels a  nudge of chest pain, but this is also unchanged and does not appear to  be a significant problem.   MEDICATIONS:  1. Vasotec 10 mg p.o. b.i.d.  2. Aspirin 81 mg p.o. daily.  3. Lipitor 80 mg p.o. daily.  4. Plavix 75 mg p.o. daily.  5. Insulin.  6. Lidoderm patch.  7. Norvasc 10 mg p.o. daily.  8. Imdur 30 mg p.o. daily.  9. Actos 45 mg p.o. daily.  10.Glucotrol 10 mg p.o. b.i.d.  11.Toprol 50 mg p.o. b.i.d.  12.Valium 10 mg p.o. t.i.d.   PHYSICAL EXAM:  Blood pressure 160/77, pulse 77.  NECK:  Supple.  CHEST:  Clear.  CARDIOVASCULAR:  Regular rate and rhythm.  EXTREMITIES:  No edema.   Her electrocardiogram shows a sinus rhythm at a rate of 77.  There are  no significant ST changes noted.   DIAGNOSES:  1. Coronary artery disease.  2. Hypertension.  3. Hyperlipidemia.  4. Diabetes mellitus.  5. Renal insufficiency.  6. History of peripheral vascular disease.   PLAN:  [name] is having occasional nudges in her chest, which  is unchanged.  We will continue with medical therapy.  I will increase  her Toprol for better blood pressure control.  We will check a CMET  today to follow her potassium, as well as her renal function and her  liver.  We  will also check lipids and a BNP given her dyspnea.  I will most likely  see her back in 6 months, and we will plan an adenosine Myoview at that  time.     [name] Bors Stanford Breed, MD, [facility]  Electronically Signed    BSC/MedQ  DD: 05/06/2006  DT: 05/06/2006  Job #: [id number]   cc:   Bonne Dolores, M.D.

## 2010-09-13 NOTE — Cardiovascular Report (Signed)
NAME:  [name], [name]            ACCOUNT NO.:  [id number]   MEDICAL RECORD NO.:  [id number]          PATIENT TYPE:  INP   LOCATION:  2903                         FACILITY:  [facility]   PHYSICIAN:  Loretha Brasil. Lia Foyer, M.D. [facility] OF BIRTH:  28-Mar-1941   DATE OF PROCEDURE:  09/01/2005  DATE OF DISCHARGE:                              CARDIAC CATHETERIZATION   INDICATIONS:  The patient presents with a myocardial infarction.  Current  study is done to assess anatomy.  The patient has an elevated creatinine and  a history of contrast allergy for which she was pretreated.   PROCEDURE:  Selective coronary arteriography.   DESCRIPTION OF PROCEDURE:  The patient was brought to the cath lab and  prepped and draped in the usual fashion through an anterior puncture. The  femoral artery was entered. A 6-French sheath placed. Views of the left and  right coronary arteries were obtained.  A total of 24 cc of contrast was  utilized for the entire procedure. There were no complications.  I then  reviewed the findings with the family in detail.   HEMODYNAMIC DATA.:  Central aortic pressure 141/72, mean 103.   ANGIOGRAPHIC DATA.:  1.  The left main is free of critical disease.  2.  Left anterior descending artery courses to the apex.  There is a major      diagonal branch. There is a fair amount of luminal irregularity in the      LAD but no major critical stenoses.  There is a major diagonal branch      with about 30-40% proximal narrowing.  3.  The circumflex provides two marginal branches.  The first marginal      branch has diffuse segmental disease of probably up to 80% which is      segmental. The second marginal branch has a proximal 70-80% narrowing      but is a small caliber vessel.  Both were less than 2 mm. The third      marginal branch is totally occluded, and there is staining distally      suggesting a recent occlusion.  The remainder of the AV circumflex      provides a small  posterolateral branch and is free of critical disease  4.  The right coronary artery provides a posterior descending and acute      marginal and a posterolateral system.  The RCA has fair amount of      luminal irregularity throughout the proximal and mid vessel but no      critical stenoses. In the distal vessel, there is area of segmental      diffuse irregularity but does not demonstrate more than about 40%      luminal reduction.  There is an acute marginal branch that has about 70%      ostial narrowing. The PDA itself is without critical obstruction and is      moderate-sized. The continuation branch that leads into the      posterolateral system has about 50-70% narrowing leading into this area.   CONCLUSION:  1.  Ventriculography was not performed due  to contrast limitations.  2.  There appears to be a total circumflex obstruction which is probably      recent.  3.  Segmental disease of the first two marginal branches, and diffuse      disease of the distal right coronary artery as noted above.   DISPOSITION:  The patient has multiple medical issues.  She had borderline  oxygen saturations in the laboratory.  Her creatinine is significantly  elevated.  The patient has been a chronic smoker.  The current findings  would suggest that medical therapy would be warranted at the present time.      Loretha Brasil. Lia Foyer, M.D. [facility]  Electronically Signed     TDS/MEDQ  D:  09/01/2005  T:  09/02/2005  Job:  [id number]   cc:   Deboraha Sprang, M.D.  [address]  [address]  [place]  [place] [address]   Kirk Ruths, M.D. [facility]  [address] [place]  [place] [address]

## 2011-01-21 ENCOUNTER — Ambulatory Visit: Payer: BC Managed Care – PPO | Admitting: Cardiology

## 2011-01-24 ENCOUNTER — Encounter: Payer: Self-pay | Admitting: Cardiology

## 2011-01-28 ENCOUNTER — Encounter: Payer: BC Managed Care – PPO | Admitting: *Deleted

## 2011-01-28 ENCOUNTER — Ambulatory Visit (INDEPENDENT_AMBULATORY_CARE_PROVIDER_SITE_OTHER): Payer: BC Managed Care – PPO | Admitting: Cardiology

## 2011-01-28 ENCOUNTER — Encounter: Payer: Self-pay | Admitting: Cardiology

## 2011-01-28 DIAGNOSIS — E785 Hyperlipidemia, unspecified: Secondary | ICD-10-CM

## 2011-01-28 DIAGNOSIS — I10 Essential (primary) hypertension: Secondary | ICD-10-CM

## 2011-01-28 DIAGNOSIS — I2510 Atherosclerotic heart disease of native coronary artery without angina pectoris: Secondary | ICD-10-CM

## 2011-01-28 DIAGNOSIS — N259 Disorder resulting from impaired renal tubular function, unspecified: Secondary | ICD-10-CM

## 2011-01-28 DIAGNOSIS — I6529 Occlusion and stenosis of unspecified carotid artery: Secondary | ICD-10-CM

## 2011-01-28 LAB — LIPID PANEL
Cholesterol: 145 mg/dL (ref 0–200)
Total CHOL/HDL Ratio: 3
Triglycerides: 230 mg/dL — ABNORMAL HIGH (ref 0.0–149.0)
VLDL: 46 mg/dL — ABNORMAL HIGH (ref 0.0–40.0)

## 2011-01-28 LAB — HEPATIC FUNCTION PANEL
AST: 18 U/L (ref 0–37)
Albumin: 4.2 g/dL (ref 3.5–5.2)
Alkaline Phosphatase: 82 U/L (ref 39–117)
Total Protein: 7.1 g/dL (ref 6.0–8.3)

## 2011-01-28 LAB — LDL CHOLESTEROL, DIRECT: Direct LDL: 65 mg/dL

## 2011-01-28 NOTE — Assessment & Plan Note (Signed)
Continue aspirin, Plavix, beta blocker and statin.

## 2011-01-28 NOTE — Assessment & Plan Note (Signed)
Continue aspirin and statin. Arrange followup carotid Dopplers.

## 2011-01-28 NOTE — Patient Instructions (Signed)
Your physician wants you to follow-up in: [place] will receive a reminder letter in the mail two months in advance. If you don't receive a letter, please call our office to schedule the follow-up appointment.   Your physician recommends that you return for lab work in: [place]

## 2011-01-28 NOTE — Assessment & Plan Note (Signed)
Blood pressure controlled. Continue present medications. 

## 2011-01-28 NOTE — Assessment & Plan Note (Signed)
Now followed by nephrology.

## 2011-01-28 NOTE — Assessment & Plan Note (Signed)
Continue statin. Check lipids and liver. 

## 2011-01-28 NOTE — Progress Notes (Signed)
HPI:[name] returns for followup today.  She has a history of coronary artery disease with her last catheterization in September 2008.  At that time, she was found to have significant left circumflex disease as well as marginal disease to correlate with perfusion abnormality on her Myoview.  However, it was felt that these are small vessels and would be difficult to intervene on either percutaneously or from a surgical standpoint.  Her only other disease was in the PDA at 50-70%.  She has been treated medically. She also has cerebrovascular disease. Last carotid Dopplers were performed in Sept 2011. There was a 40-59% bilateral stenosis. Followup was recommended in one year. Myoview in Sept 2011 showed EF 74; inferolateral infarct and mild to moderate peri-infarct ischemia; results similar to previous and we treated medically.  I last saw her in August 2011. Since then she has some dyspnea on exertion which is unchanged. No orthopnea, PND, pedal edema or syncope. Occasional chest pain promptly relieved with nitroglycerin unchanged.  Current Outpatient Prescriptions  Medication Sig Dispense Refill  . amLODipine (NORVASC) 10 MG tablet Take 10 mg by mouth daily.        Marland Kitchen aspirin 81 MG tablet Take 81 mg by mouth daily.        . clopidogrel (PLAVIX) 75 MG tablet Take 75 mg by mouth daily.        . diazepam (VALIUM) 10 MG tablet Take 10 mg by mouth 3 (three) times daily.        Marland Kitchen DOCUSATE SODIUM PO Take 1 Can by mouth 2 (two) times daily.        . enalapril (VASOTEC) 20 MG tablet Take 20 mg by mouth daily.        Marland Kitchen glipiZIDE (GLUCOTROL XL) 10 MG 24 hr tablet Take 10 mg by mouth 2 (two) times daily.        . insulin glargine (LANTUS) 100 UNIT/ML injection Inject into the skin as directed.        . isosorbide mononitrate (IMDUR) 30 MG 24 hr tablet Take 30 mg by mouth 2 (two) times daily.        . metoprolol (TOPROL-XL) 50 MG 24 hr tablet Take 50 mg by mouth daily.        . nitroGLYCERIN (NITROSTAT) 0.4 MG  SL tablet Place 0.4 mg under the tongue every 5 (five) minutes as needed.        . rosuvastatin (CRESTOR) 20 MG tablet Take 20 mg by mouth daily.           Past Medical History  Diagnosis Date  . Coronary atherosclerosis of unspecified type of vessel, native or graft     MI x2  . Hyperlipidemia, mixed   . Unspecified essential hypertension   . PVD (peripheral vascular disease)   . Hemorrhoids   . Cerebrovascular disease     CVA-left sided weakness 2 years ago  . Anxiety disorder   . Cat allergies   . Glaucoma   . Depression   . Kidney stones     Past Surgical History  Procedure Date  . Polyectomy   . Laparoectomy   . Cholecystectomy     History   Social History  . Marital Status: Married    Spouse Name: N/A    Number of Children: N/A  . Years of Education: N/A   Occupational History  . Retired    Social History Main Topics  . Smoking status: Former Research scientist (life sciences)  . Smokeless tobacco: Not on  file  . Alcohol Use: No  . Drug Use: No  . Sexually Active: Not on file   Other Topics Concern  . Not on file   Social History Narrative  . No narrative on file    ROS: arthralgias and back pain but no fevers or chills, productive cough, hemoptysis, dysphasia, odynophagia, melena, hematochezia, dysuria, hematuria, rash, seizure activity, orthopnea, PND, pedal edema, claudication. Remaining systems are negative.  Physical Exam: Well-developed well-nourished in no acute distress.  Skin is warm and dry.  HEENT is normal.  Neck is supple. No thyromegaly.  Chest is clear to auscultation with normal expansion.  Cardiovascular exam is regular rate and rhythm.  Abdominal exam nontender or distended. No masses palpated. Extremities show no edema. neuro grossly intact  ECG sinus rhythm at a rate of 68. Nonspecific ST changes.

## 2011-01-29 ENCOUNTER — Encounter: Payer: Self-pay | Admitting: *Deleted

## 2011-02-07 LAB — CBC
HCT: 28.9 — ABNORMAL LOW
Hemoglobin: 10.3 — ABNORMAL LOW
MCHC: 34.5
MCHC: 34.7
MCV: 86.6
MCV: 87.8
RBC: 3.34 — ABNORMAL LOW
RBC: 3.49 — ABNORMAL LOW
RBC: 4.28
RDW: 14.6 — ABNORMAL HIGH
WBC: 14.4 — ABNORMAL HIGH

## 2011-02-07 LAB — BASIC METABOLIC PANEL
CO2: 27
Chloride: 106
Chloride: 107
Creatinine, Ser: 1.57 — ABNORMAL HIGH
Creatinine, Ser: 1.8 — ABNORMAL HIGH
GFR calc Af Amer: 34 — ABNORMAL LOW
GFR calc Af Amer: 40 — ABNORMAL LOW
Glucose, Bld: 168 — ABNORMAL HIGH
Potassium: 4.3
Sodium: 138

## 2011-02-07 LAB — COMPREHENSIVE METABOLIC PANEL
ALT: 20
AST: 20
Albumin: 4.1
Alkaline Phosphatase: 124 — ABNORMAL HIGH
Calcium: 10
GFR calc Af Amer: 42 — ABNORMAL LOW
Potassium: 4.6
Sodium: 134 — ABNORMAL LOW
Total Protein: 6.8

## 2011-02-07 LAB — GLUCOSE, RANDOM: Glucose, Bld: 367 — ABNORMAL HIGH

## 2011-02-17 ENCOUNTER — Other Ambulatory Visit: Payer: Self-pay | Admitting: Cardiology

## 2011-02-17 DIAGNOSIS — I6529 Occlusion and stenosis of unspecified carotid artery: Secondary | ICD-10-CM

## 2011-02-18 ENCOUNTER — Encounter (INDEPENDENT_AMBULATORY_CARE_PROVIDER_SITE_OTHER): Payer: BC Managed Care – PPO | Admitting: *Deleted

## 2011-02-18 DIAGNOSIS — I6529 Occlusion and stenosis of unspecified carotid artery: Secondary | ICD-10-CM

## 2011-08-02 ENCOUNTER — Inpatient Hospital Stay (HOSPITAL_COMMUNITY)
Admission: EM | Admit: 2011-08-02 | Discharge: 2011-08-06 | DRG: 320 | Disposition: A | Discharge status: Home / Self Care | Payer: BC Managed Care – PPO | Attending: Internal Medicine | Admitting: Internal Medicine

## 2011-08-02 ENCOUNTER — Emergency Department (HOSPITAL_COMMUNITY): Payer: BC Managed Care – PPO

## 2011-08-02 ENCOUNTER — Encounter (HOSPITAL_COMMUNITY): Payer: Self-pay

## 2011-08-02 DIAGNOSIS — Z794 Long term (current) use of insulin: Secondary | ICD-10-CM

## 2011-08-02 DIAGNOSIS — Z91041 Radiographic dye allergy status: Secondary | ICD-10-CM

## 2011-08-02 DIAGNOSIS — K5900 Constipation, unspecified: Secondary | ICD-10-CM

## 2011-08-02 DIAGNOSIS — R109 Unspecified abdominal pain: Secondary | ICD-10-CM | POA: Diagnosis present

## 2011-08-02 DIAGNOSIS — I6529 Occlusion and stenosis of unspecified carotid artery: Secondary | ICD-10-CM

## 2011-08-02 DIAGNOSIS — E860 Dehydration: Secondary | ICD-10-CM | POA: Diagnosis present

## 2011-08-02 DIAGNOSIS — I10 Essential (primary) hypertension: Secondary | ICD-10-CM

## 2011-08-02 DIAGNOSIS — Z6837 Body mass index (BMI) 37.0-37.9, adult: Secondary | ICD-10-CM

## 2011-08-02 DIAGNOSIS — I252 Old myocardial infarction: Secondary | ICD-10-CM

## 2011-08-02 DIAGNOSIS — N259 Disorder resulting from impaired renal tubular function, unspecified: Secondary | ICD-10-CM

## 2011-08-02 DIAGNOSIS — E1129 Type 2 diabetes mellitus with other diabetic kidney complication: Secondary | ICD-10-CM | POA: Diagnosis present

## 2011-08-02 DIAGNOSIS — F411 Generalized anxiety disorder: Secondary | ICD-10-CM | POA: Diagnosis present

## 2011-08-02 DIAGNOSIS — I69959 Hemiplegia and hemiparesis following unspecified cerebrovascular disease affecting unspecified side: Secondary | ICD-10-CM

## 2011-08-02 DIAGNOSIS — E669 Obesity, unspecified: Secondary | ICD-10-CM

## 2011-08-02 DIAGNOSIS — E119 Type 2 diabetes mellitus without complications: Secondary | ICD-10-CM

## 2011-08-02 DIAGNOSIS — K649 Unspecified hemorrhoids: Secondary | ICD-10-CM

## 2011-08-02 DIAGNOSIS — K921 Melena: Secondary | ICD-10-CM

## 2011-08-02 DIAGNOSIS — D649 Anemia, unspecified: Secondary | ICD-10-CM | POA: Diagnosis present

## 2011-08-02 DIAGNOSIS — IMO0002 Reserved for concepts with insufficient information to code with codable children: Secondary | ICD-10-CM | POA: Diagnosis present

## 2011-08-02 DIAGNOSIS — N179 Acute kidney failure, unspecified: Secondary | ICD-10-CM

## 2011-08-02 DIAGNOSIS — I2510 Atherosclerotic heart disease of native coronary artery without angina pectoris: Secondary | ICD-10-CM | POA: Diagnosis present

## 2011-08-02 DIAGNOSIS — R079 Chest pain, unspecified: Secondary | ICD-10-CM

## 2011-08-02 DIAGNOSIS — N200 Calculus of kidney: Secondary | ICD-10-CM

## 2011-08-02 DIAGNOSIS — I129 Hypertensive chronic kidney disease with stage 1 through stage 4 chronic kidney disease, or unspecified chronic kidney disease: Secondary | ICD-10-CM | POA: Diagnosis present

## 2011-08-02 DIAGNOSIS — I739 Peripheral vascular disease, unspecified: Secondary | ICD-10-CM | POA: Diagnosis present

## 2011-08-02 DIAGNOSIS — N10 Acute tubulo-interstitial nephritis: Secondary | ICD-10-CM | POA: Diagnosis present

## 2011-08-02 DIAGNOSIS — N19 Unspecified kidney failure: Secondary | ICD-10-CM

## 2011-08-02 DIAGNOSIS — N12 Tubulo-interstitial nephritis, not specified as acute or chronic: Secondary | ICD-10-CM

## 2011-08-02 DIAGNOSIS — N058 Unspecified nephritic syndrome with other morphologic changes: Secondary | ICD-10-CM | POA: Diagnosis present

## 2011-08-02 DIAGNOSIS — R110 Nausea: Secondary | ICD-10-CM | POA: Diagnosis present

## 2011-08-02 DIAGNOSIS — N39498 Other specified urinary incontinence: Secondary | ICD-10-CM | POA: Diagnosis present

## 2011-08-02 DIAGNOSIS — E1165 Type 2 diabetes mellitus with hyperglycemia: Secondary | ICD-10-CM | POA: Diagnosis present

## 2011-08-02 DIAGNOSIS — E875 Hyperkalemia: Secondary | ICD-10-CM | POA: Diagnosis not present

## 2011-08-02 DIAGNOSIS — E785 Hyperlipidemia, unspecified: Secondary | ICD-10-CM

## 2011-08-02 DIAGNOSIS — R32 Unspecified urinary incontinence: Secondary | ICD-10-CM

## 2011-08-02 DIAGNOSIS — N183 Chronic kidney disease, stage 3 unspecified: Secondary | ICD-10-CM

## 2011-08-02 HISTORY — DX: Chronic kidney disease, stage 3 (moderate): N18.3

## 2011-08-02 LAB — BASIC METABOLIC PANEL
BUN: 37 mg/dL — ABNORMAL HIGH (ref 6–23)
Chloride: 107 mEq/L (ref 96–112)
Creatinine, Ser: 2.66 mg/dL — ABNORMAL HIGH (ref 0.50–1.10)
GFR calc Af Amer: 19 mL/min — ABNORMAL LOW (ref >90)
Glucose, Bld: 224 mg/dL — ABNORMAL HIGH (ref 70–99)

## 2011-08-02 LAB — URINALYSIS, ROUTINE W REFLEX MICROSCOPIC
Bilirubin Urine: NEGATIVE
Glucose, UA: NEGATIVE mg/dL
Ketones, ur: NEGATIVE mg/dL
Nitrite: NEGATIVE
Specific Gravity, Urine: 1.02 (ref 1.005–1.030)
pH: 5.5 (ref 5.0–8.0)

## 2011-08-02 LAB — CBC
HCT: 35.9 % — ABNORMAL LOW (ref 36.0–46.0)
Hemoglobin: 11.3 g/dL — ABNORMAL LOW (ref 12.0–15.0)
MCH: 29.7 pg (ref 26.0–34.0)
MCHC: 31.5 g/dL (ref 30.0–36.0)
MCV: 94.2 fL (ref 78.0–100.0)
Platelets: 205 K/uL (ref 150–400)
RBC: 3.81 MIL/uL — ABNORMAL LOW (ref 3.87–5.11)
RDW: 14.3 % (ref 11.5–15.5)
WBC: 10.4 K/uL (ref 4.0–10.5)

## 2011-08-02 LAB — HEPATIC FUNCTION PANEL
ALT: 15 U/L (ref 0–35)
AST: 14 U/L (ref 0–37)
Alkaline Phosphatase: 91 U/L (ref 39–117)
Bilirubin, Direct: <0.1 mg/dL (ref 0.0–0.3)

## 2011-08-02 LAB — DIFFERENTIAL
Basophils Relative: 0 % (ref 0–1)
Lymphs Abs: 2.3 10*3/uL (ref 0.7–4.0)
Monocytes Absolute: 1.1 10*3/uL — ABNORMAL HIGH (ref 0.1–1.0)
Monocytes Relative: 10 % (ref 3–12)
Neutro Abs: 6.6 10*3/uL (ref 1.7–7.7)

## 2011-08-02 LAB — HEMOGLOBIN A1C: Hgb A1c MFr Bld: 6.6 % — ABNORMAL HIGH (ref <5.7)

## 2011-08-02 LAB — URINE MICROSCOPIC-ADD ON

## 2011-08-02 IMAGING — CT CT ABD-PELV W/O CM
2 of 3 series · 16 of 46 positions shown, 18 images · non-contrast
Comparison: [DATE]

CLINICAL DATA: Left flank pain

CT ABDOMEN AND PELVIS WITHOUT CONTRAST
TECHNIQUE: Multidetector CT imaging of the abdomen and pelvis was
performed following the standard protocol without intravenous
contrast.

[Series 2: standard/full over (age)lbs 5.0 · axial · 0.92mm/px · z∈[-420,-44]mm · 13 of 87 slices shown, 15 images]
[im 6/87  soft-tissue]
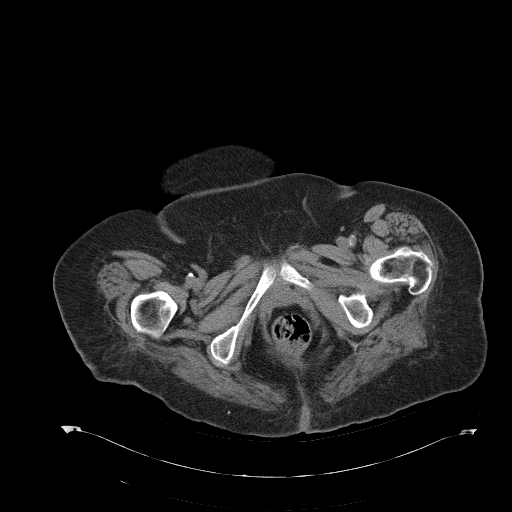
[im 6/87  bone]
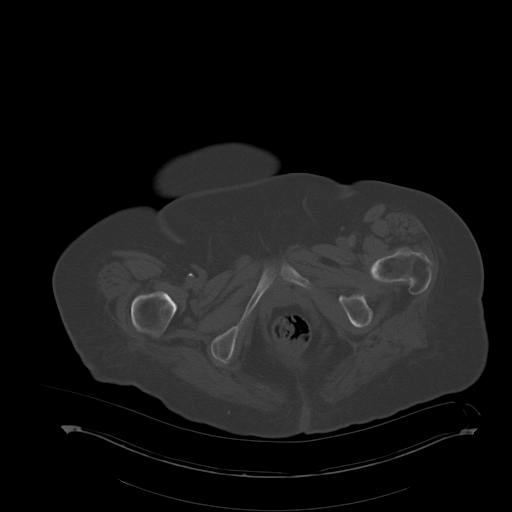
[im 12/87  soft-tissue]
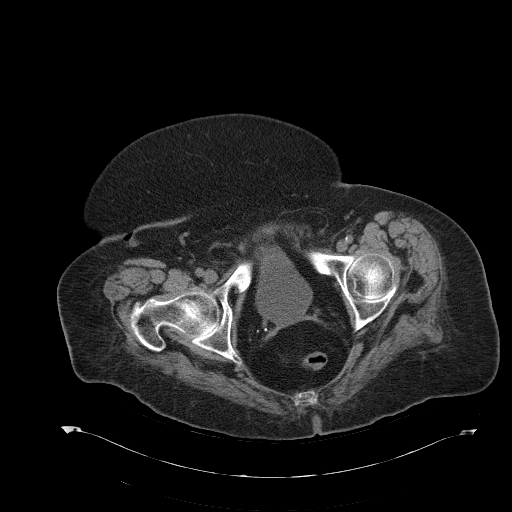
[im 17/87  soft-tissue]
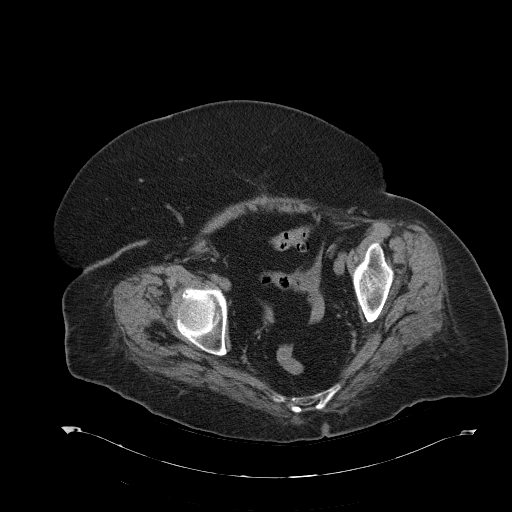
[im 25/87  soft-tissue]
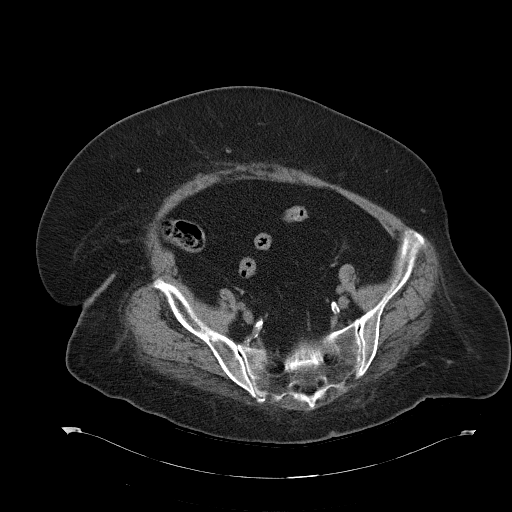
[im 31/87  soft-tissue]
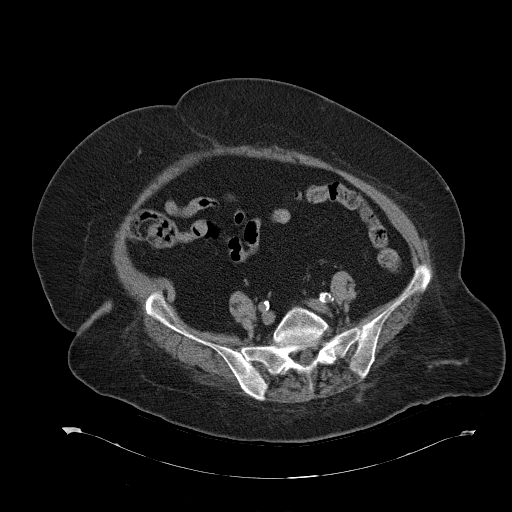
[im 37/87  soft-tissue]
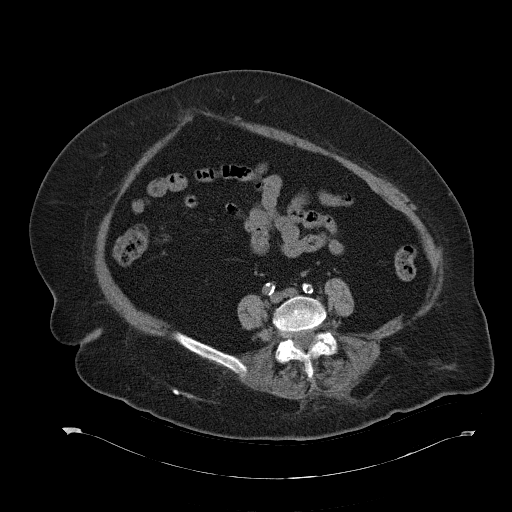
[im 45/87  soft-tissue]
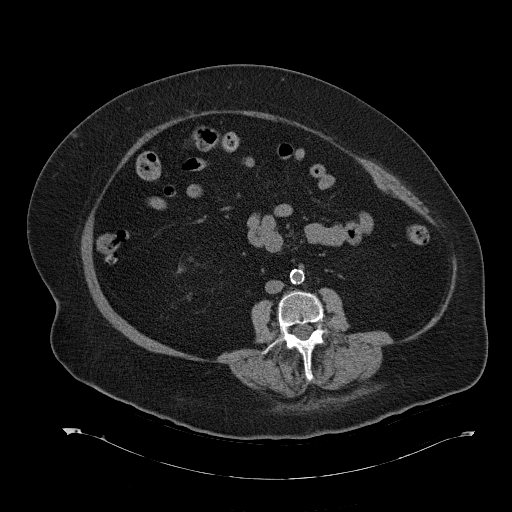
[im 50/87  soft-tissue]
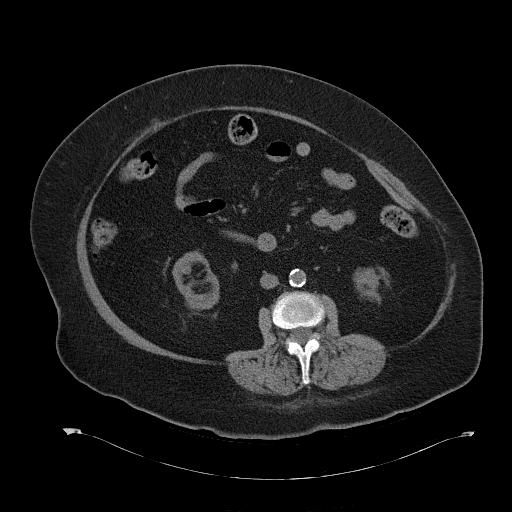
[im 56/87  soft-tissue]
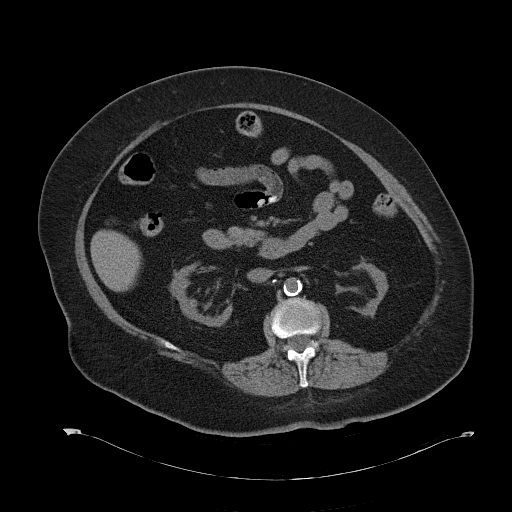
[im 56/87  bone]
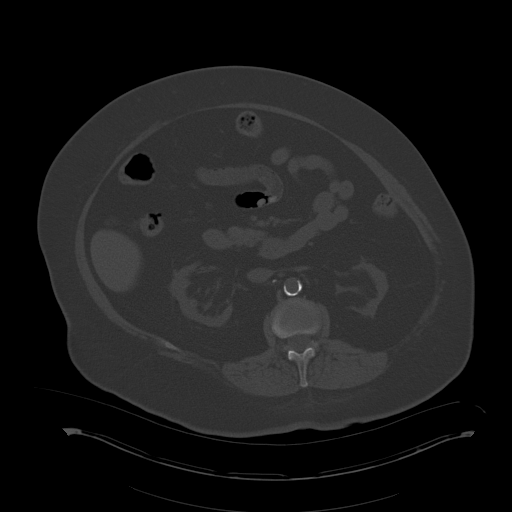
[im 62/87  soft-tissue]
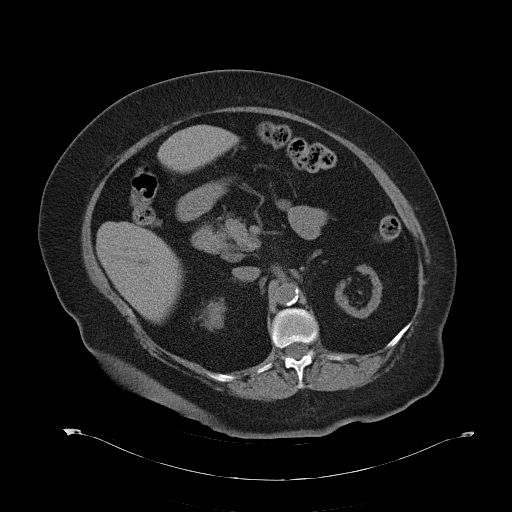
[im 70/87  soft-tissue]
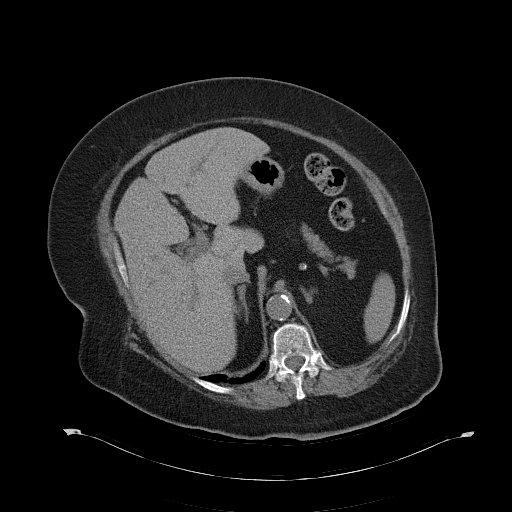
[im 75/87  soft-tissue]
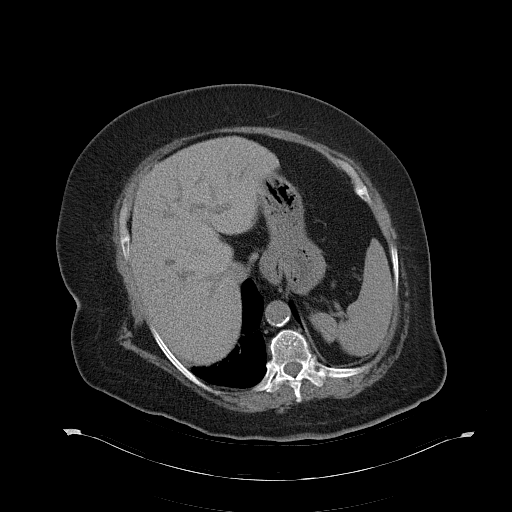
[im 81/87  soft-tissue]
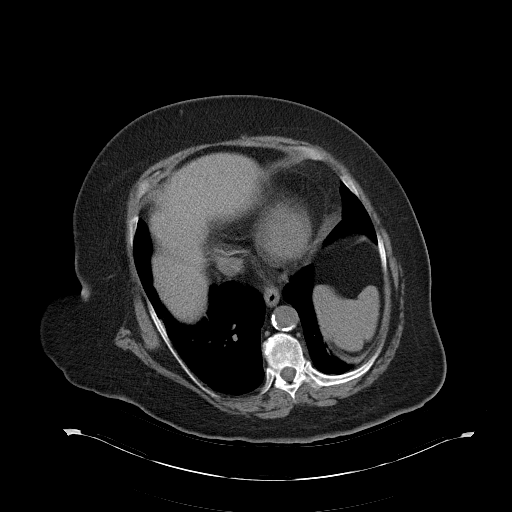

[Series 4: mpr coronal · coronal · 0.85mm/px · 3 of 109 slices shown]
[im 37/109  soft-tissue]
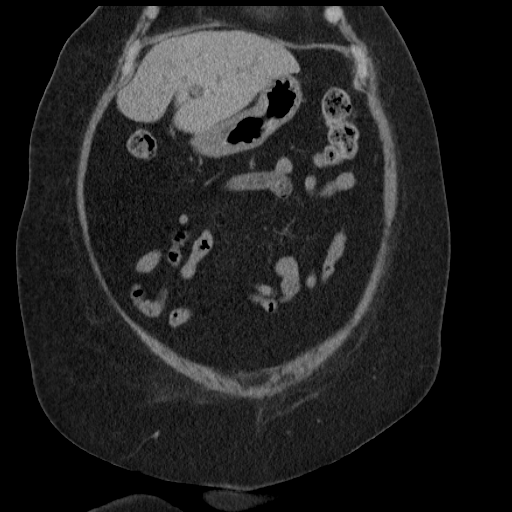
[im 49/109  soft-tissue]
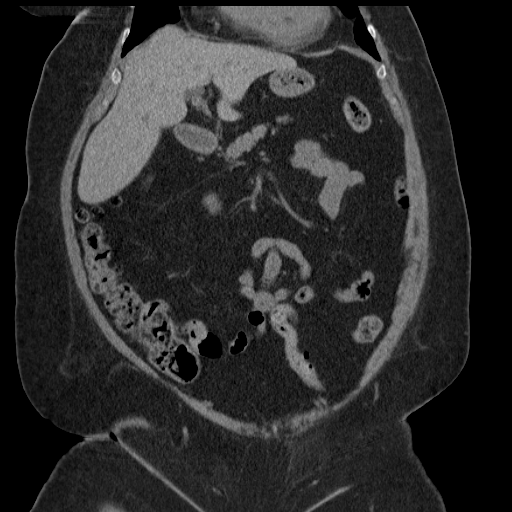
[im 61/109  soft-tissue]
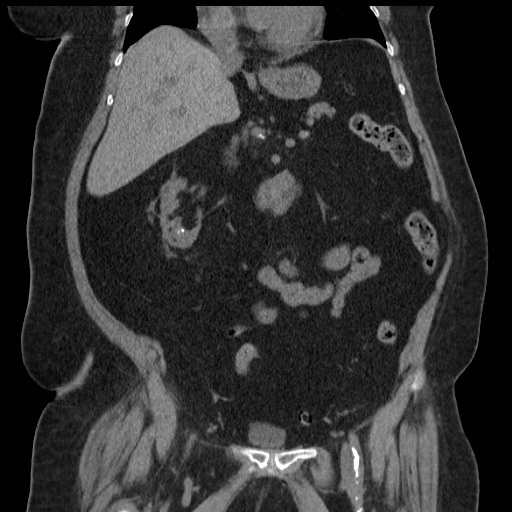

[16 of 46 positions shown; findings below may reference images not displayed]

FINDINGS: Lung bases are unremarkable.  Sagittal images of the spine shows
diffuse osteopenia.  No destructive bony lesions are noted.
Atherosclerotic calcifications are noted abdominal aorta, SMA
origin, bilateral renal artery origin and bilateral iliac arteries.
No aortic aneurysm.  There are streak artifacts from the patient's
large body habitus.

Unenhanced liver, pancreas spleen and adrenals are unremarkable.
Tiny calcified granuloma left hepatic lobe is stable.  Splenic
artery calcifications are noted.

A small hiatal hernia again noted.  Scattered colonic diverticula.
Vascular calcifications are again noted in the left renal hilum.

Again noted bilateral renal atrophy with significant cortical
thinning.  At least three nonobstructive calcifications are noted
within the right kidney.  Nonobstructive calcification in the upper
pole posterior aspect measures 2 mm.  Nonobstructive calcification
in the lower pole right kidney measures 2.5 mm.  Nonobstructive
calcification in the lower pole of the left kidney measures 2.9 mm.

No hydronephrosis or hydroureter.  Bilateral no calcified ureteral
calculi are identified.

There is no evidence of acute diverticulitis.  No aortic aneurysm.

No small bowel obstruction.  No pericecal inflammation.  The
patient status post hysterectomy.  No calcified calculi are noted
within the urinary bladder.  Bilateral small inguinal hernia
containing fat is noted without evidence of acute complication.  No
destructive bony lesions are noted within pelvis.  Degenerative
changes bilateral SI joints. There is a small umbilical hernia
containing fat without evidence of acute complication.
IMPRESSION: 1.  Again noted bilateral renal atrophy and bilateral cortical
thinning.  Nonobstructive nephrolithiasis.
2.  No hydronephrosis or hydroureter.  No calcified ureteral
calculi are identified.
3.  Atherosclerotic vascular calcifications are noted.

4.  Scattered colonic diverticula are noted without evidence of
acute diverticulitis.
5.  Status post hysterectomy.

## 2011-08-02 MED ORDER — SODIUM CHLORIDE 0.9 % IV BOLUS (SEPSIS)
1000.0000 mL | Freq: Once | INTRAVENOUS | Status: AC
  Administered 2011-08-02: 1000 mL via INTRAVENOUS

## 2011-08-02 MED ORDER — METOPROLOL SUCCINATE ER 50 MG PO TB24
50.0000 mg | ORAL_TABLET | Freq: Every day | ORAL | Status: DC
  Administered 2011-08-03 – 2011-08-06 (×4): 50 mg via ORAL
  Filled 2011-08-02 (×4): qty 1

## 2011-08-02 MED ORDER — DIAZEPAM 5 MG PO TABS
5.0000 mg | ORAL_TABLET | Freq: Four times a day (QID) | ORAL | Status: DC | PRN
  Administered 2011-08-02 – 2011-08-06 (×3): 5 mg via ORAL
  Filled 2011-08-02 (×3): qty 1

## 2011-08-02 MED ORDER — ACETAMINOPHEN 325 MG PO TABS: 650.0000 mg | ORAL_TABLET | Freq: Four times a day (QID) | ORAL | Status: DC | PRN

## 2011-08-02 MED ORDER — ONDANSETRON HCL 4 MG/2ML IJ SOLN
4.0000 mg | Freq: Once | INTRAMUSCULAR | Status: AC
  Administered 2011-08-02: 4 mg via INTRAVENOUS
  Filled 2011-08-02: qty 2

## 2011-08-02 MED ORDER — MORPHINE SULFATE 4 MG/ML IJ SOLN
4.0000 mg | INTRAMUSCULAR | Status: DC | PRN
  Administered 2011-08-02: 4 mg via INTRAVENOUS
  Filled 2011-08-02: qty 1

## 2011-08-02 MED ORDER — CLOPIDOGREL BISULFATE 75 MG PO TABS
75.0000 mg | ORAL_TABLET | Freq: Every day | ORAL | Status: DC
  Administered 2011-08-02 – 2011-08-06 (×5): 75 mg via ORAL
  Filled 2011-08-02 (×5): qty 1

## 2011-08-02 MED ORDER — MORPHINE SULFATE 4 MG/ML IJ SOLN
4.0000 mg | Freq: Once | INTRAMUSCULAR | Status: AC
  Administered 2011-08-02: 4 mg via INTRAVENOUS
  Filled 2011-08-02: qty 1

## 2011-08-02 MED ORDER — ACETAMINOPHEN 650 MG RE SUPP: 650.0000 mg | Freq: Four times a day (QID) | RECTAL | Status: DC | PRN

## 2011-08-02 MED ORDER — AMLODIPINE BESYLATE 5 MG PO TABS
10.0000 mg | ORAL_TABLET | Freq: Every day | ORAL | Status: DC
  Administered 2011-08-03 – 2011-08-06 (×4): 10 mg via ORAL
  Filled 2011-08-02 (×4): qty 2

## 2011-08-02 MED ORDER — ONDANSETRON HCL 4 MG/2ML IJ SOLN
4.0000 mg | Freq: Four times a day (QID) | INTRAMUSCULAR | Status: DC | PRN
  Administered 2011-08-02 – 2011-08-06 (×3): 4 mg via INTRAVENOUS
  Filled 2011-08-02 (×3): qty 2

## 2011-08-02 MED ORDER — MORPHINE SULFATE 2 MG/ML IJ SOLN
2.0000 mg | INTRAMUSCULAR | Status: DC | PRN
  Administered 2011-08-02 – 2011-08-03 (×4): 4 mg via INTRAVENOUS
  Administered 2011-08-03 – 2011-08-04 (×3): 2 mg via INTRAVENOUS
  Filled 2011-08-02 (×4): qty 2
  Filled 2011-08-02 (×3): qty 1

## 2011-08-02 MED ORDER — SODIUM CHLORIDE 0.9 % IV BOLUS (SEPSIS)
500.0000 mL | Freq: Once | INTRAVENOUS | Status: AC
  Administered 2011-08-02: 500 mL via INTRAVENOUS

## 2011-08-02 MED ORDER — ENOXAPARIN SODIUM 30 MG/0.3ML ~~LOC~~ SOLN
30.0000 mg | SUBCUTANEOUS | Status: DC
  Administered 2011-08-02 – 2011-08-04 (×3): 30 mg via SUBCUTANEOUS
  Filled 2011-08-02 (×4): qty 0.3

## 2011-08-02 MED ORDER — ASPIRIN EC 81 MG PO TBEC
81.0000 mg | DELAYED_RELEASE_TABLET | Freq: Every day | ORAL | Status: DC
  Administered 2011-08-03 – 2011-08-06 (×4): 81 mg via ORAL
  Filled 2011-08-02 (×4): qty 1

## 2011-08-02 MED ORDER — NITROGLYCERIN 0.4 MG SL SUBL: 0.4000 mg | SUBLINGUAL_TABLET | SUBLINGUAL | Status: DC | PRN

## 2011-08-02 MED ORDER — ISOSORBIDE MONONITRATE ER 60 MG PO TB24
30.0000 mg | ORAL_TABLET | Freq: Two times a day (BID) | ORAL | Status: DC
  Administered 2011-08-02 – 2011-08-06 (×9): 30 mg via ORAL
  Filled 2011-08-02 (×9): qty 1

## 2011-08-02 MED ORDER — INSULIN GLARGINE 100 UNIT/ML ~~LOC~~ SOLN
10.0000 [IU] | Freq: Every day | SUBCUTANEOUS | Status: DC
  Administered 2011-08-02: 10 [IU] via SUBCUTANEOUS

## 2011-08-02 MED ORDER — DOCUSATE SODIUM 100 MG PO CAPS
100.0000 mg | ORAL_CAPSULE | Freq: Two times a day (BID) | ORAL | Status: DC
  Administered 2011-08-02 – 2011-08-06 (×9): 100 mg via ORAL
  Filled 2011-08-02 (×9): qty 1

## 2011-08-02 MED ORDER — DEXTROSE 5 % IV SOLN
1.0000 g | Freq: Three times a day (TID) | INTRAVENOUS | Status: DC
  Administered 2011-08-02 – 2011-08-05 (×9): 1 g via INTRAVENOUS
  Filled 2011-08-02 (×14): qty 1

## 2011-08-02 NOTE — ED Notes (Signed)
Sprite given to pt

## 2011-08-02 NOTE — ED Notes (Signed)
EMS reports pt c/o left flank pain and went to pcp TU[facility] or wed and was diagnosed with kidney infection.  Reports pain worse this am.  States pain radiates around to lower abd.  Reports history of kidney stones.

## 2011-08-02 NOTE — ED Notes (Signed)
Report given to [place], RN unit 300. Ready to receive patient.

## 2011-08-02 NOTE — ED Notes (Signed)
Attempted to call report. RN did not answer. Left message with secretary to have inpatient RN to call this RN back.

## 2011-08-02 NOTE — H&P (Signed)
Hospital Admission Note Date: 08/02/2011  Patient name: [name] Medical record number: [id number] Date of birth: 08-17-1940 Age: 71 y.o. Gender: female PCP: Glo Herring., MD, MD  Attending physician: Delfina Redwood, MD  Chief Complaint: Left flank pain  History of Present Illness:  [name] is an 71 y.o. female who presents via EMS with severe left flank pain. She was diagnosed with a urinary tract infection several days ago and started on Cipro. She has had nausea and anorexia. The pain is especially severe today. She has a history of nephrolithiasis. CT of the abdomen and pelvis showed no ureteral stones. Multiple nonobstructing stones in both kidneys. Renal cortical thickening noted. Urinalysis is relatively benign though patient has been on Cipro as mentioned above. Her BUN and creatinine are higher than baseline she appears clinically dehydrated. She is being admitted for probable pyelonephritis with atrophic pain and dehydration due to persistent nausea. History of urinary incontinence and is obese and debilitated. She is requesting a Foley catheter be placed. She is allergic to multiple antibiotics.  Past Medical History  Diagnosis Date  . Coronary atherosclerosis of unspecified type of vessel, native or graft     MI x2  . Hyperlipidemia, mixed   . Unspecified essential hypertension   . PVD (peripheral vascular disease)   . Hemorrhoids   . Cerebrovascular disease     CVA-left sided weakness 2 years ago  . Anxiety disorder   . Cat allergies   . Glaucoma   . Depression   . Kidney stones     Meds: Prescriptions prior to admission  Medication Sig Dispense Refill  . amLODipine (NORVASC) 10 MG tablet Take 10 mg by mouth daily.        Marland Kitchen aspirin 81 MG tablet Take 81 mg by mouth daily.        . cetirizine (ZYRTEC) 10 MG tablet Take 10 mg by mouth daily.      . clopidogrel (PLAVIX) 75 MG tablet Take 75 mg by mouth daily.        . cyanocobalamin (,VITAMIN  B-12,) 1000 MCG/ML injection Inject 1,000 mcg into the muscle every 30 (thirty) days.      . diazepam (VALIUM) 10 MG tablet Take 10 mg by mouth every 6 (six) hours as needed. Takes 3-4 times per day as needed for being upset. Always takes 1 tablet at bedtime      . docusate sodium (COLACE) 100 MG capsule Take 100 mg by mouth 2 (two) times daily.      . enalapril (VASOTEC) 20 MG tablet Take 20 mg by mouth daily.        Marland Kitchen glipiZIDE (GLUCOTROL XL) 10 MG 24 hr tablet Take 10 mg by mouth 2 (two) times daily.       . insulin glargine (LANTUS) 100 UNIT/ML injection Inject 0-30 Units into the skin at bedtime. Adjusts amount based on her blood sugar levels      . isosorbide mononitrate (IMDUR) 30 MG 24 hr tablet Take 30 mg by mouth 2 (two) times daily.        . metoprolol (TOPROL-XL) 50 MG 24 hr tablet Take 50 mg by mouth daily.        . nitroGLYCERIN (NITROSTAT) 0.4 MG SL tablet Place 0.4 mg under the tongue every 5 (five) minutes x 3 doses as needed. For chest pain      . rosuvastatin (CRESTOR) 20 MG tablet Take 20 mg by mouth daily.  Allergies: Contrast media; Hydromorphone hcl; Biaxin; Cephalexin; Penicillins; and Sulfonamide derivatives History   Social History  . Marital Status: Married    Spouse Name: N/A    Number of Children: N/A  . Years of Education: N/A   Occupational History  . Retired    Social History Main Topics  . Smoking status: Former Research scientist (life sciences)  . Smokeless tobacco: Not on file  . Alcohol Use: No  . Drug Use: No  . Sexually Active: Not on file   Other Topics Concern  . Not on file   Social History Narrative  . No narrative on file   Family History  Problem Relation Age of Onset  . Kidney disease Mother   . Heart attack Father    Past Surgical History  Procedure Date  . Polyectomy   . Laparoectomy   . Cholecystectomy   . Abdominal hysterectomy     Review of Systems: Systems reviewed and as per HPI, otherwise negative.  Physical Exam: Blood pressure  142/70, pulse 75, temperature 97.4 F (36.3 C), temperature source Oral, resp. rate 18, height 4\' 11"  (1.499 m), weight 83.915 kg (185 lb), SpO2 90.00%. BP 112/65  Pulse 69  Temp(Src) 97.7 F (36.5 C) (Oral)  Resp 18  Ht 4\' 11"  (1.499 m)  Wt 83.915 kg (185 lb)  BMI 37.37 kg/m2  SpO2 91%  General Appearance:    Alert, cooperative, no distress, appears stated age obese   Head:    Normocephalic, without obvious abnormality, atraumatic  Eyes:    PERRL, conjunctiva/corneas clear, EOM's intact, fundi    benign, both eyes     Nose:   Nares normal, septum midline, mucosa normal, no drainage    or sinus tenderness  Throat:   Dry mucous membranes. No thrush.   Neck:   Supple, symmetrical, trachea midline, no adenopathy;    thyroid:  no enlargement/tenderness/nodules; no carotid   bruit or JVD  Back:     Symmetric, no curvature, ROM normal, left CVA tenderness  Lungs:     Clear to auscultation bilaterally, respirations unlabored  Chest Wall:    No tenderness or deformity   Heart:    Regular rate and rhythm, S1 and S2 normal, no murmur, rub   or gallop     Abdomen:    obese. Left-sided abdominal tenderness present. Normal bowel sounds.   Genitalia:   deferred   Rectal:   deferred   Extremities:   Extremities normal, atraumatic, no cyanosis or edema  Pulses:   2+ and symmetric all extremities  Skin:   Skin color, texture, turgor normal, no rashes or lesions  Lymph nodes:   Cervical, supraclavicular, and axillary nodes normal  Neurologic:   CNII-XII intact, normal strength, sensation and reflexes    throughout    Psychiatric: Normal affect.  Lab results: Basic Metabolic Panel:  [facility] 08/02/11 0815  NA 140  K 4.7  CL 107  CO2 21  GLUCOSE 224*  BUN 37*  CREATININE 2.66*  CALCIUM 10.2  MG --  PHOS --   Liver Function Tests:  [facility] 08/02/11 0815  AST 14  ALT 15  ALKPHOS 91  BILITOT 0.4  PROT 7.0  ALBUMIN 4.1    Basename 08/02/11 0815  LIPASE 44  AMYLASE --   No  results found for this basename: AMMONIA:2 in the last 72 hours CBC:  Basename 08/02/11 0815  WBC 10.4  NEUTROABS 6.6  HGB 11.3*  HCT 35.9*  MCV 94.2  PLT 205   Cardiac  Enzymes: No results found for this basename: CKTOTAL:3,CKMB:3,CKMBINDEX:3,TROPONINI:3 in the last 72 hours BNP: No results found for this basename: PROBNP:3 in the last 72 hours D-Dimer: No results found for this basename: DDIMER:2 in the last 72 hours CBG: No results found for this basename: GLUCAP:6 in the last 72 hours Hemoglobin A1C: No results found for this basename: HGBA1C in the last 72 hours Fasting Lipid Panel: No results found for this basename: CHOL,HDL,LDLCALC,TRIG,CHOLHDL,LDLDIRECT in the last 72 hours Thyroid Function Tests: No results found for this basename: TSH,T4TOTAL,FREET4,T3FREE,THYROIDAB in the last 72 hours Anemia Panel: No results found for this basename: VITAMINB12,FOLATE,FERRITIN,TIBC,IRON,RETICCTPCT in the last 72 hours Coagulation: No results found for this basename: LABPROT:2,INR:2 in the last 72 hours Urine Drug Screen: Drugs of Abuse  No results found for this basename: labopia, cocainscrnur, labbenz, amphetmu, thcu, labbarb    Alcohol Level: No results found for this basename: ETH:2 in the last 72 hours Urinalysis:  Basename 08/02/11 0907  COLORURINE YELLOW  LABSPEC 1.020  PHURINE 5.5  GLUCOSEU NEGATIVE  HGBUR TRACE*  [place] TRACE*  UROBILINOGEN 0.2  NITRITE NEGATIVE  LEUKOCYTESUR NEGATIVE   Imaging results:  Ct Abdomen Pelvis Wo Contrast  08/02/2011  *RADIOLOGY REPORT*  Clinical Data: Left flank pain  CT ABDOMEN AND PELVIS WITHOUT CONTRAST  Technique:  Multidetector CT imaging of the abdomen and pelvis was performed following the standard protocol without intravenous contrast.  Comparison: 11/29/2009  Findings:  Lung bases are unremarkable.  Sagittal images of the spine shows diffuse osteopenia.  No destructive bony  lesions are noted. Atherosclerotic calcifications are noted abdominal aorta, SMA origin, bilateral renal artery origin and bilateral iliac arteries. No aortic aneurysm.  There are streak artifacts from the patient's large body habitus.  Unenhanced liver, pancreas spleen and adrenals are unremarkable. Tiny calcified granuloma left hepatic lobe is stable.  Splenic artery calcifications are noted.  A small hiatal hernia again noted.  Scattered colonic diverticula. Vascular calcifications are again noted in the left renal hilum.  Again noted bilateral renal atrophy with significant cortical thinning.  At least three nonobstructive calcifications are noted within the right kidney.  Nonobstructive calcification in the upper pole posterior aspect measures 2 mm.  Nonobstructive calcification in the lower pole right kidney measures 2.5 mm.  Nonobstructive calcification in the lower pole of the left kidney measures 2.9 mm.  No hydronephrosis or hydroureter.  Bilateral no calcified ureteral calculi are identified.  There is no evidence of acute diverticulitis.  No aortic aneurysm.  No small bowel obstruction.  No pericecal inflammation.  The patient status post hysterectomy.  No calcified calculi are noted within the urinary bladder.  Bilateral small inguinal hernia containing fat is noted without evidence of acute complication.  No destructive bony lesions are noted within pelvis.  Degenerative changes bilateral SI joints. There is a small umbilical hernia containing fat without evidence of acute complication.  IMPRESSION:  1.  Again noted bilateral renal atrophy and bilateral cortical thinning.  Nonobstructive nephrolithiasis. 2.  No hydronephrosis or hydroureter.  No calcified ureteral calculi are identified. 3.  Atherosclerotic vascular calcifications are noted.  4.  Scattered colonic diverticula are noted without evidence of acute diverticulitis. 5.  Status post hysterectomy.  Original Report Authenticated By: Lahoma Crocker,  M.D.    Assessment & Plan: Principal Problem:  *Pyelonephritis, acute Active Problems:  Dehydration  DM type 2, uncontrolled, with renal complications  ARF (acute renal failure)  Nausea  Incontinence  Obesity  HYPERTENSION, UNSPECIFIED  CAD, UNSPECIFIED SITE  Urine culture is pending. Will switch to aztreonam, as symptoms have worsened on Cipro, so may have a fluoroquinolone resistant organism. Will give IV fluids and monitor creatinine. Clear liquids and antiemetics as needed. Pain medications as needed. Decrease insulin until eating reliably.  [place] L 08/02/2011, 5:40 PM

## 2011-08-02 NOTE — ED Notes (Signed)
Pt reports was put on cipro for kidney infection.

## 2011-08-02 NOTE — ED Notes (Signed)
Pt reports still nauseated and unable to drink fluids at this time.  EDP notified 500cc bolus given and another dose of zofran ordered.

## 2011-08-02 NOTE — ED Provider Notes (Signed)
History  This chart was scribed for Sharyon Cable, MD by Jenne Campus. This patient was seen in room APA19/APA19 and the patient's care was started at 8:12AM.  CSN: [id number]  Arrival date & time 08/02/11  0802   First MD Initiated Contact with Patient 08/02/11 [phone]      Chief Complaint  Patient presents with  . Flank Pain    Patient is a 71 y.o. female presenting with flank pain. The history is provided by the patient. No language interpreter was used.  Flank Pain This is a new problem. Associated symptoms include abdominal pain (radiation from flank). Pertinent negatives include no chest pain.    [name] is a 71 y.o. female with a h/o kidney stones brought in by ambulance, who presents to the Emergency Department complaining of 3 to 4 days of gradual onset, gradually worsening, constant left flank pain.She reports that the pain became severe around 5 hours ago.The pain radiates to the lower abdomen. She denies any modifying factors. She denies emesis, fevers, chest pain, weakness or numbness in legs as associated symptoms. She states that she was seen by her PCP 3 or 4 days ago and was diagnosed with a kidney infection. She reports that she was prescribed Cipro with no improvement in symptoms. She reports that her symptoms are similar to symptoms that she experienced with previous kidney stone diagnoses. Pt also c/o chronic problems with urinary incontinence. She also has a h/o PVD and HTN. She is a former smoker but denies alcohol use.  Pt's urologist is Dr. Michela Pitcher.   Past Medical History  Diagnosis Date  . Coronary atherosclerosis of unspecified type of vessel, native or graft     MI x2  . Hyperlipidemia, mixed   . Unspecified essential hypertension   . PVD (peripheral vascular disease)   . Hemorrhoids   . Cerebrovascular disease     CVA-left sided weakness 2 years ago  . Anxiety disorder   . Cat allergies   . Glaucoma   . Depression   . Kidney stones      Past Surgical History  Procedure Date  . Polyectomy   . Laparoectomy   . Cholecystectomy   . Abdominal hysterectomy     Family History  Problem Relation Age of Onset  . Kidney disease Mother   . Heart attack Father     History  Substance Use Topics  . Smoking status: Former Research scientist (life sciences)  . Smokeless tobacco: Not on file  . Alcohol Use: No    Review of Systems  Constitutional: Negative for fever and chills.  Cardiovascular: Negative for chest pain.  Gastrointestinal: Positive for abdominal pain (radiation from flank). Negative for nausea, vomiting and diarrhea.  Genitourinary: Positive for flank pain (Left). Negative for dysuria, urgency and hematuria.  Neurological: Negative for weakness (in legs) and numbness (in legs).  All other systems reviewed and are negative.    Allergies  Cephalexin; Hydromorphone hcl; Penicillins; and Sulfonamide derivatives  Home Medications   Current Outpatient Rx  Name Route Sig Dispense Refill  . AMLODIPINE BESYLATE 10 MG PO TABS Oral Take 10 mg by mouth daily.      . ASPIRIN 81 MG PO TABS Oral Take 81 mg by mouth daily.      Marland Kitchen CLOPIDOGREL BISULFATE 75 MG PO TABS Oral Take 75 mg by mouth daily.      Marland Kitchen DIAZEPAM 10 MG PO TABS Oral Take 10 mg by mouth 3 (three) times daily.      Marland Kitchen  DOCUSATE SODIUM PO Oral Take 1 Can by mouth 2 (two) times daily.      . ENALAPRIL MALEATE 20 MG PO TABS Oral Take 20 mg by mouth daily.      Marland Kitchen GLIPIZIDE ER 10 MG PO TB24 Oral Take 10 mg by mouth 2 (two) times daily.      . INSULIN GLARGINE 100 UNIT/ML [place] SOLN Subcutaneous Inject into the skin as directed.      . ISOSORBIDE MONONITRATE ER 30 MG PO TB24 Oral Take 30 mg by mouth 2 (two) times daily.      Marland Kitchen METOPROLOL SUCCINATE ER 50 MG PO TB24 Oral Take 50 mg by mouth daily.      Marland Kitchen NITROGLYCERIN 0.4 MG SL SUBL Sublingual Place 0.4 mg under the tongue every 5 (five) minutes as needed.      Marland Kitchen ROSUVASTATIN CALCIUM 20 MG PO TABS Oral Take 20 mg by mouth daily.         Triage Vitals: BP 149/50  Pulse 89  Temp(Src) 98.1 F (36.7 C) (Oral)  Resp 22  Ht 4\' 11"  (1.499 m)  Wt 185 lb (83.915 kg)  BMI 37.37 kg/m2  SpO2 100%  Physical Exam  Nursing note and vitals reviewed. CONSTITUTIONAL: Well developed/well nourished, uncomfortable appearing HEAD AND FACE: Normocephalic/atraumatic EYES: EOMI/PERRL ENMT: Mucous membranes moist NECK: supple no meningeal signs SPINE:entire spine nontender CV: S1/S2 noted, no murmurs/rubs/gallops noted LUNGS: Lungs are clear to auscultation bilaterally, no apparent distress ABDOMEN: soft, nontender, no rebound or guarding [id number] cva tenderness, no bruising noted NEURO: Pt is awake/alert, moves all extremitiesx4 EXTREMITIES: pulses normal, full ROM SKIN: warm, color normal PSYCH: no abnormalities of mood noted  ED Course  Procedures   DIAGNOSTIC STUDIES: Oxygen Saturation is 100% on room air, normal by my interpretation.    COORDINATION OF CARE: 8:15AM-Will give pt morphine, because she is allergic to Dilaudid (pt states that she can't breathe well when she takes it). Discussed blood work, CT scan of abdomen and treatment plan with pt and pt agreed to plan.  9:13AM-Pt r echecked and states that her pain has improved. Advised pt that we are still waiting on her CT results.  11:10 AM Pt with continued nausea, lives alone and has difficulty ambulating Given renal failure will need admission for monitoring/fluids hydration D/w triad dr Conley Canal, will admit and also consider pyelo given she has been on cipro but continued flank pain (u/a here unremarkable) but has been on cipro She will place her on antibiotics  Results for orders placed during the hospital encounter of 08/02/11  CBC      Component Value Range   WBC 10.4  4.0 - 10.5 (K/uL)   RBC 3.81 (*) 3.87 - 5.11 (MIL/uL)   Hemoglobin 11.3 (*) 12.0 - 15.0 (g/dL)   HCT 35.9 (*) 36.0 - 46.0 (%)   MCV 94.2  78.0 - 100.0 (fL)   MCH 29.7  26.0 - 34.0 (pg)    MCHC 31.5  30.0 - 36.0 (g/dL)   RDW 14.3  11.5 - 15.5 (%)   Platelets 205  150 - 400 (K/uL)  DIFFERENTIAL      Component Value Range   Neutrophils Relative 64  43 - 77 (%)   Neutro Abs 6.6  1.7 - 7.7 (K/uL)   Lymphocytes Relative 22  12 - 46 (%)   Lymphs Abs 2.3  0.7 - 4.0 (K/uL)   Monocytes Relative 10  3 - 12 (%)   Monocytes Absolute 1.1 (*) 0.1 -  1.0 (K/uL)   Eosinophils Relative 4  0 - 5 (%)   Eosinophils Absolute 0.4  0.0 - 0.7 (K/uL)   Basophils Relative 0  0 - 1 (%)   Basophils Absolute 0.0  0.0 - 0.1 (K/uL)  BASIC METABOLIC PANEL      Component Value Range   Sodium 140  135 - 145 (mEq/L)   Potassium 4.7  3.5 - 5.1 (mEq/L)   Chloride 107  96 - 112 (mEq/L)   CO2 21  19 - 32 (mEq/L)   Glucose, Bld 224 (*) 70 - 99 (mg/dL)   BUN 37 (*) 6 - 23 (mg/dL)   Creatinine, Ser 2.66 (*) 0.50 - 1.10 (mg/dL)   Calcium 10.2  8.4 - 10.5 (mg/dL)   GFR calc non Af Amer 17 (*) >90 (mL/min)   GFR calc Af Amer 19 (*) >90 (mL/min)  URINALYSIS, ROUTINE W REFLEX MICROSCOPIC      Component Value Range   Color, Urine YELLOW  YELLOW    APPearance CLEAR  CLEAR    Specific Gravity, Urine 1.020  1.005 - 1.030    pH 5.5  5.0 - 8.0    Glucose, UA NEGATIVE  NEGATIVE (mg/dL)   Hgb urine dipstick TRACE (*) NEGATIVE    Bilirubin Urine NEGATIVE  NEGATIVE    Ketones, ur NEGATIVE  NEGATIVE (mg/dL)   Protein, ur TRACE (*) NEGATIVE (mg/dL)   Urobilinogen, UA 0.2  0.0 - 1.0 (mg/dL)   Nitrite NEGATIVE  NEGATIVE    Leukocytes, UA NEGATIVE  NEGATIVE   URINE MICROSCOPIC-ADD ON      Component Value Range   Squamous Epithelial / LPF RARE  RARE    WBC, UA 3-6  <3 (WBC/hpf)   RBC / HPF 0-2  <3 (RBC/hpf)   Bacteria, UA FEW (*) RARE    Ct Abdomen Pelvis Wo Contrast  08/02/2011  *RADIOLOGY REPORT*  Clinical Data: Left flank pain  CT ABDOMEN AND PELVIS WITHOUT CONTRAST  Technique:  Multidetector CT imaging of the abdomen and pelvis was performed following the standard protocol without intravenous contrast.   Comparison: 11/29/2009  Findings:  Lung bases are unremarkable.  Sagittal images of the spine shows diffuse osteopenia.  No destructive bony lesions are noted. Atherosclerotic calcifications are noted abdominal aorta, SMA origin, bilateral renal artery origin and bilateral iliac arteries. No aortic aneurysm.  There are streak artifacts from the patient's large body habitus.  Unenhanced liver, pancreas spleen and adrenals are unremarkable. Tiny calcified granuloma left hepatic lobe is stable.  Splenic artery calcifications are noted.  A small hiatal hernia again noted.  Scattered colonic diverticula. Vascular calcifications are again noted in the left renal hilum.  Again noted bilateral renal atrophy with significant cortical thinning.  At least three nonobstructive calcifications are noted within the right kidney.  Nonobstructive calcification in the upper pole posterior aspect measures 2 mm.  Nonobstructive calcification in the lower pole right kidney measures 2.5 mm.  Nonobstructive calcification in the lower pole of the left kidney measures 2.9 mm.  No hydronephrosis or hydroureter.  Bilateral no calcified ureteral calculi are identified.  There is no evidence of acute diverticulitis.  No aortic aneurysm.  No small bowel obstruction.  No pericecal inflammation.  The patient status post hysterectomy.  No calcified calculi are noted within the urinary bladder.  Bilateral small inguinal hernia containing fat is noted without evidence of acute complication.  No destructive bony lesions are noted within pelvis.  Degenerative changes bilateral SI joints. There is a small  umbilical hernia containing fat without evidence of acute complication.  IMPRESSION:  1.  Again noted bilateral renal atrophy and bilateral cortical thinning.  Nonobstructive nephrolithiasis. 2.  No hydronephrosis or hydroureter.  No calcified ureteral calculi are identified. 3.  Atherosclerotic vascular calcifications are noted.  4.  Scattered colonic  diverticula are noted without evidence of acute diverticulitis. 5.  Status post hysterectomy.  Original Report Authenticated By: Lahoma Crocker, M.D.     MDM  Nursing notes reviewed and considered in documentation All labs/vitals reviewed and considered Previous records reviewed and considered     I personally performed the services described in this documentation, which was scribed in my presence. The recorded information has been reviewed and considered.      Sharyon Cable, MD 08/02/11 [phone]

## 2011-08-02 NOTE — ED Notes (Signed)
Pt's room air 02 sat 90%, placed pt on 02 at [employer].

## 2011-08-03 LAB — BASIC METABOLIC PANEL
Calcium: 9.5 mg/dL (ref 8.4–10.5)
GFR calc non Af Amer: 20 mL/min — ABNORMAL LOW (ref >90)
Glucose, Bld: 126 mg/dL — ABNORMAL HIGH (ref 70–99)
Sodium: 139 mEq/L (ref 135–145)

## 2011-08-03 LAB — URINE CULTURE
Colony Count: NO GROWTH
Culture  Setup Time: 201304062028
Culture: NO GROWTH

## 2011-08-03 LAB — CBC
MCH: 30.2 pg (ref 26.0–34.0)
MCHC: 32.6 g/dL (ref 30.0–36.0)
Platelets: 207 10*3/uL (ref 150–400)

## 2011-08-03 LAB — GLUCOSE, CAPILLARY
Glucose-Capillary: 214 mg/dL — ABNORMAL HIGH (ref 70–99)
Glucose-Capillary: 203 mg/dL — ABNORMAL HIGH (ref 70–99)
Glucose-Capillary: 175 mg/dL — ABNORMAL HIGH (ref 70–99)

## 2011-08-03 MED ORDER — MAGNESIUM HYDROXIDE 400 MG/5ML PO SUSP: 30.0000 mL | Freq: Every day | ORAL | Status: DC | PRN

## 2011-08-03 MED ORDER — LOPERAMIDE HCL 2 MG PO CAPS: 4.0000 mg | ORAL_CAPSULE | Freq: Once | ORAL | Status: AC | PRN

## 2011-08-03 MED ORDER — PHENYLEPH-SHARK LIV OIL-MO-PET 0.25-3-14-71.9 % RE OINT
TOPICAL_OINTMENT | Freq: Three times a day (TID) | RECTAL | Status: DC | PRN
  Filled 2011-08-03: qty 28.4

## 2011-08-03 MED ORDER — SODIUM CHLORIDE 0.9 % IJ SOLN
INTRAMUSCULAR | Status: AC
  Administered 2011-08-03: 10 mL
  Filled 2011-08-03: qty 3

## 2011-08-03 MED ORDER — GUAIFENESIN-DM 100-10 MG/5ML PO SYRP
10.0000 mL | ORAL_SOLUTION | ORAL | Status: DC | PRN
  Administered 2011-08-03: 10 mL via ORAL
  Filled 2011-08-03: qty 10

## 2011-08-03 MED ORDER — PRAMOXINE HCL 1 % RE OINT
1.0000 "application " | TOPICAL_OINTMENT | Freq: Three times a day (TID) | RECTAL | Status: DC | PRN
  Filled 2011-08-03: qty 30

## 2011-08-03 MED ORDER — ALUM & MAG HYDROXIDE-SIMETH 200-200-20 MG/5ML PO SUSP
15.0000 mL | ORAL | Status: DC | PRN
  Administered 2011-08-03: 30 mL via ORAL
  Filled 2011-08-03: qty 30

## 2011-08-03 MED ORDER — INSULIN GLARGINE 100 UNIT/ML ~~LOC~~ SOLN
15.0000 [IU] | Freq: Every day | SUBCUTANEOUS | Status: DC
  Administered 2011-08-03 – 2011-08-04 (×2): 15 [IU] via SUBCUTANEOUS

## 2011-08-03 MED ORDER — HYDROCORTISONE 1 % EX CREA: 1.0000 "application " | TOPICAL_CREAM | Freq: Three times a day (TID) | CUTANEOUS | Status: DC | PRN

## 2011-08-03 MED ORDER — INSULIN ASPART 100 UNIT/ML ~~LOC~~ SOLN
0.0000 [IU] | Freq: Three times a day (TID) | SUBCUTANEOUS | Status: DC
  Administered 2011-08-03: 3 [IU] via SUBCUTANEOUS
  Administered 2011-08-03 – 2011-08-05 (×6): 2 [IU] via SUBCUTANEOUS
  Administered 2011-08-06: 3 [IU] via SUBCUTANEOUS
  Administered 2011-08-06: 2 [IU] via SUBCUTANEOUS

## 2011-08-03 MED ORDER — LOPERAMIDE HCL 2 MG PO CAPS: 2.0000 mg | ORAL_CAPSULE | ORAL | Status: DC | PRN

## 2011-08-03 NOTE — Progress Notes (Signed)
Assisted patient to stand at bedside, 2 person assist, only stood for a minute d/t pain, assisted patient back in bed, family at bedside

## 2011-08-03 NOTE — Progress Notes (Signed)
Attempted to call ED Nurse to receive report. No answer.

## 2011-08-03 NOTE — Progress Notes (Signed)
Subjective: Still with left-sided abdominal and flank pain, but improved from yesterday. Nausea resolved. Tolerating a solid diet.  Objective: Vital signs in last 24 hours: Filed Vitals:   08/02/11 1006 08/02/11 1425 08/02/11 2156 08/03/11 0459  BP: 137/49 142/70 136/75 112/65  Pulse: 81 75 74 69  Temp: 97.8 F (36.6 C) 97.4 F (36.3 C) 97 F (36.1 C) 97.7 F (36.5 C)  TempSrc: Oral Oral Oral Oral  Resp: 14 18 18 18   Height:      Weight:      SpO2: 90% 90% 93% 91%   Weight change:   Intake/Output Summary (Last 24 hours) at 08/03/11 1129 Last data filed at 08/03/11 0830  Gross per 24 hour  Intake    490 ml  Output    650 ml  Net   -160 ml   Physical Exam: General: Slightly groggy after pain medication Lungs clear to auscultation bilaterally without wheeze rhonchi or rales Cardiovascular regular rate rhythm without murmurs gallops rubs Abdomen obese, soft, mild left-sided abdominal tenderness Extremities no clubbing cyanosis or edema  Lab Results: Basic Metabolic Panel:  Lab [id number] 0600 08/02/11 0815  NA 139 140  K 5.0 4.7  CL 108 107  CO2 22 21  GLUCOSE 126* 224*  BUN 28* 37*  CREATININE 2.30* 2.66*  CALCIUM 9.5 10.2  MG -- --  PHOS -- --   Liver Function Tests:  Lab 08/02/11 0815  AST 14  ALT 15  ALKPHOS 91  BILITOT 0.4  PROT 7.0  ALBUMIN 4.1    Lab 08/02/11 0815  LIPASE 44  AMYLASE --   No results found for this basename: AMMONIA:2 in the last 168 hours CBC:  Lab 08/03/11 0600 08/02/11 0815  WBC 9.3 10.4  NEUTROABS -- 6.6  HGB 10.4* 11.3*  HCT 31.9* 35.9*  MCV 92.7 94.2  PLT 207 205   Cardiac Enzymes: No results found for this basename: CKTOTAL:3,CKMB:3,CKMBINDEX:3,TROPONINI:3 in the last 168 hours BNP: No results found for this basename: PROBNP:3 in the last 168 hours D-Dimer: No results found for this basename: DDIMER:2 in the last 168 hours CBG:  Lab 08/02/11 2150  GLUCAP 214*   Hemoglobin A1C:  Lab 08/02/11 1357  HGBA1C  6.6*   Fasting Lipid Panel: No results found for this basename: CHOL,HDL,LDLCALC,TRIG,CHOLHDL,LDLDIRECT in the last 168 hours Thyroid Function Tests: No results found for this basename: TSH,T4TOTAL,FREET4,T3FREE,THYROIDAB in the last 168 hours Coagulation: No results found for this basename: LABPROT:4,INR:4 in the last 168 hours Anemia Panel: No results found for this basename: VITAMINB12,FOLATE,FERRITIN,TIBC,IRON,RETICCTPCT in the last 168 hours Urine Drug Screen: Drugs of Abuse  No results found for this basename: labopia, cocainscrnur, labbenz, amphetmu, thcu, labbarb    Alcohol Level: No results found for this basename: ETH:2 in the last 168 hours Urinalysis:  Lab 08/02/11 0907  COLORURINE YELLOW  LABSPEC 1.020  PHURINE 5.5  GLUCOSEU NEGATIVE  HGBUR TRACE*  BILIRUBINUR NEGATIVE  KETONESUR NEGATIVE  PROTEINUR TRACE*  UROBILINOGEN 0.2  NITRITE NEGATIVE  LEUKOCYTESUR NEGATIVE   Micro Results: No results found for this or any previous visit (from the past 240 hour(s)). Studies/Results: Ct Abdomen Pelvis Wo Contrast  08/02/2011  *RADIOLOGY REPORT*  Clinical Data: Left flank pain  CT ABDOMEN AND PELVIS WITHOUT CONTRAST  Technique:  Multidetector CT imaging of the abdomen and pelvis was performed following the standard protocol without intravenous contrast.  Comparison: 11/29/2009  Findings:  Lung bases are unremarkable.  Sagittal images of the spine shows diffuse osteopenia.  No destructive bony  lesions are noted. Atherosclerotic calcifications are noted abdominal aorta, SMA origin, bilateral renal artery origin and bilateral iliac arteries. No aortic aneurysm.  There are streak artifacts from the patient's large body habitus.  Unenhanced liver, pancreas spleen and adrenals are unremarkable. Tiny calcified granuloma left hepatic lobe is stable.  Splenic artery calcifications are noted.  A small hiatal hernia again noted.  Scattered colonic diverticula. Vascular calcifications are  again noted in the left renal hilum.  Again noted bilateral renal atrophy with significant cortical thinning.  At least three nonobstructive calcifications are noted within the right kidney.  Nonobstructive calcification in the upper pole posterior aspect measures 2 mm.  Nonobstructive calcification in the lower pole right kidney measures 2.5 mm.  Nonobstructive calcification in the lower pole of the left kidney measures 2.9 mm.  No hydronephrosis or hydroureter.  Bilateral no calcified ureteral calculi are identified.  There is no evidence of acute diverticulitis.  No aortic aneurysm.  No small bowel obstruction.  No pericecal inflammation.  The patient status post hysterectomy.  No calcified calculi are noted within the urinary bladder.  Bilateral small inguinal hernia containing fat is noted without evidence of acute complication.  No destructive bony lesions are noted within pelvis.  Degenerative changes bilateral SI joints. There is a small umbilical hernia containing fat without evidence of acute complication.  IMPRESSION:  1.  Again noted bilateral renal atrophy and bilateral cortical thinning.  Nonobstructive nephrolithiasis. 2.  No hydronephrosis or hydroureter.  No calcified ureteral calculi are identified. 3.  Atherosclerotic vascular calcifications are noted.  4.  Scattered colonic diverticula are noted without evidence of acute diverticulitis. 5.  Status post hysterectomy.  Original Report Authenticated By: Lahoma Crocker, M.D.   Scheduled Meds:   . amLODipine  10 mg Oral Daily  . aspirin EC  81 mg Oral Daily  . aztreonam  1 g Intravenous Q8H  . clopidogrel  75 mg Oral Daily  . docusate sodium  100 mg Oral BID  . enoxaparin  30 mg Subcutaneous Q24H  . insulin glargine  15 Units Subcutaneous QHS  . isosorbide mononitrate  30 mg Oral BID  . metoprolol succinate  50 mg Oral Daily  . sodium chloride      . DISCONTD: insulin glargine  10 Units Subcutaneous QHS   Continuous Infusions:  PRN  Meds:.acetaminophen, alum & mag hydroxide-simeth, diazepam, guaiFENesin-dextromethorphan, hydrocortisone cream, loperamide, loperamide, magnesium hydroxide, morphine injection, nitroGLYCERIN, ondansetron, phenylephrine-shark liver oil-mineral oil-petrolatum, DISCONTD: acetaminophen, DISCONTD:  morphine injection, DISCONTD: pramoxine-mineral oil-zinc Assessment/Plan: Principal Problem:  *Pyelonephritis, acute Active Problems:  Dehydration  DM type 2, uncontrolled, with renal complications  ARF (acute renal failure)  Nausea  Incontinence  Obesity  HYPERTENSION, UNSPECIFIED  CAD, UNSPECIFIED SITE  Continue aztreonam. Await urine cultures. Increase Lantus as needed, as appetite improves. Physical therapy consult. Continue IV fluids. Monitor creatinine.   LOS: 1 day   Treniya Lobb L 08/03/2011, 11:29 AM

## 2011-08-04 ENCOUNTER — Encounter (HOSPITAL_COMMUNITY): Payer: Self-pay | Admitting: Internal Medicine

## 2011-08-04 DIAGNOSIS — N183 Chronic kidney disease, stage 3 unspecified: Secondary | ICD-10-CM

## 2011-08-04 DIAGNOSIS — N200 Calculus of kidney: Secondary | ICD-10-CM | POA: Diagnosis present

## 2011-08-04 HISTORY — DX: Chronic kidney disease, stage 3 unspecified: N18.30

## 2011-08-04 LAB — BASIC METABOLIC PANEL
CO2: 23 mEq/L (ref 19–32)
Chloride: 108 mEq/L (ref 96–112)
Creatinine, Ser: 2.51 mg/dL — ABNORMAL HIGH (ref 0.50–1.10)
Glucose, Bld: 223 mg/dL — ABNORMAL HIGH (ref 70–99)

## 2011-08-04 LAB — GLUCOSE, CAPILLARY
Glucose-Capillary: 174 mg/dL — ABNORMAL HIGH (ref 70–99)
Glucose-Capillary: 194 mg/dL — ABNORMAL HIGH (ref 70–99)

## 2011-08-04 LAB — POTASSIUM: Potassium: 5.5 mEq/L — ABNORMAL HIGH (ref 3.5–5.1)

## 2011-08-04 MED ORDER — MORPHINE SULFATE 4 MG/ML IJ SOLN
4.0000 mg | INTRAMUSCULAR | Status: DC | PRN
  Administered 2011-08-04 – 2011-08-05 (×5): 4 mg via INTRAVENOUS
  Filled 2011-08-04 (×5): qty 1

## 2011-08-04 MED ORDER — FUROSEMIDE 10 MG/ML IJ SOLN
10.0000 mg | Freq: Two times a day (BID) | INTRAMUSCULAR | Status: AC
  Administered 2011-08-04 (×2): 10 mg via INTRAVENOUS
  Filled 2011-08-04 (×2): qty 2

## 2011-08-04 MED ORDER — SODIUM CHLORIDE 0.45 % IV SOLN
INTRAVENOUS | Status: DC
  Administered 2011-08-04 – 2011-08-05 (×2): via INTRAVENOUS
  Filled 2011-08-04 (×7): qty 1000

## 2011-08-04 MED ORDER — SODIUM CHLORIDE 0.9 % IV SOLN
1.0000 g | Freq: Once | INTRAVENOUS | Status: AC
  Administered 2011-08-04: 1 g via INTRAVENOUS
  Filled 2011-08-04: qty 10

## 2011-08-04 MED ORDER — SODIUM POLYSTYRENE SULFONATE 15 GM/60ML PO SUSP
15.0000 g | Freq: Once | ORAL | Status: AC
  Administered 2011-08-04: 15 g via ORAL
  Filled 2011-08-04 (×2): qty 60

## 2011-08-04 NOTE — Progress Notes (Signed)
Subjective:  She continues to have bilateral flank pain left greater than the right. Occasional nausea but no vomiting. No actual pain when she urinates. She has a Foley catheter. She says that she wants more pain medicine.  Objective: Vital signs in last 24 hours: Filed Vitals:   08/03/11 0459 08/03/11 1358 08/03/11 2140 08/04/11 0503  BP: 112/65 111/65 143/70 125/74  Pulse: 69 69 71 73  Temp: 97.7 F (36.5 C) 97.7 F (36.5 C) 97.4 F (36.3 C) 98.1 F (36.7 C)  TempSrc: Oral Oral Oral Oral  Resp: 18 18 18 20   Height:      Weight:      SpO2: 91% 90% 90% 90%   Weight change:   Intake/Output Summary (Last 24 hours) at 08/04/11 0915 Last data filed at 08/04/11 0508  Gross per 24 hour  Intake    470 ml  Output   1151 ml  Net   -681 ml   Physical Exam: General: Alert and in no acute distress. Lungs clear to auscultation bilaterally without wheeze rhonchi or rales Cardiovascular regular rate rhythm without murmurs gallops rubs Abdomen obese, soft, mild left flank pain, positive bowel sounds, no distention. Extremities no clubbing cyanosis or edema  Lab Results: Basic Metabolic Panel:  Lab [id number] 08/03/11 0600  NA 138 139  K 6.2* 5.0  CL 108 108  CO2 23 22  GLUCOSE 223* 126*  BUN 29* 28*  CREATININE 2.51* 2.30*  CALCIUM 9.6 9.5  MG -- --  PHOS -- --   Liver Function Tests:  Lab 08/02/11 0815  AST 14  ALT 15  ALKPHOS 91  BILITOT 0.4  PROT 7.0  ALBUMIN 4.1    Lab 08/02/11 0815  LIPASE 44  AMYLASE --   No results found for this basename: AMMONIA:2 in the last 168 hours CBC:  Lab 08/03/11 0600 08/02/11 0815  WBC 9.3 10.4  NEUTROABS -- 6.6  HGB 10.4* 11.3*  HCT 31.9* 35.9*  MCV 92.7 94.2  PLT 207 205   Cardiac Enzymes: No results found for this basename: CKTOTAL:3,CKMB:3,CKMBINDEX:3,TROPONINI:3 in the last 168 hours BNP: No results found for this basename: PROBNP:3 in the last 168 hours D-Dimer: No results found for this basename: DDIMER:2  in the last 168 hours CBG:  Lab 08/04/11 0729 08/03/11 2137 08/03/11 1703 08/03/11 1200 08/02/11 2150  GLUCAP 168* 175* 203* 186* 214*   Hemoglobin A1C:  Lab 08/02/11 1357  HGBA1C 6.6*   Fasting Lipid Panel: No results found for this basename: CHOL,HDL,LDLCALC,TRIG,CHOLHDL,LDLDIRECT in the last 168 hours Thyroid Function Tests: No results found for this basename: TSH,T4TOTAL,FREET4,T3FREE,THYROIDAB in the last 168 hours Coagulation: No results found for this basename: LABPROT:4,INR:4 in the last 168 hours Anemia Panel: No results found for this basename: VITAMINB12,FOLATE,FERRITIN,TIBC,IRON,RETICCTPCT in the last 168 hours Urine Drug Screen: Drugs of Abuse  No results found for this basename: labopia,  cocainscrnur,  labbenz,  amphetmu,  thcu,  labbarb    Alcohol Level: No results found for this basename: ETH:2 in the last 168 hours Urinalysis:  Lab 08/02/11 0907  COLORURINE YELLOW  LABSPEC 1.020  PHURINE 5.5  GLUCOSEU NEGATIVE  HGBUR TRACE*  BILIRUBINUR NEGATIVE  KETONESUR NEGATIVE  PROTEINUR TRACE*  UROBILINOGEN 0.2  NITRITE NEGATIVE  LEUKOCYTESUR NEGATIVE   Micro Results: Recent Results (from the past 240 hour(s))  URINE CULTURE     Status: Normal   Collection Time   08/02/11  9:07 AM      Component Value Range Status Comment   Specimen  Description URINE, CATHETERIZED   Final    Special Requests NONE   Final    Culture  Setup Time [id number]   Final    [place] Count NO GROWTH   Final    Culture NO GROWTH   Final    Report Status 08/03/2011 FINAL   Final    Studies/Results: No results found. Scheduled Meds:    . amLODipine  10 mg Oral Daily  . aspirin EC  81 mg Oral Daily  . aztreonam  1 g Intravenous Q8H  . calcium gluconate  1 g Intravenous Once  . clopidogrel  75 mg Oral Daily  . docusate sodium  100 mg Oral BID  . enoxaparin  30 mg Subcutaneous Q24H  . furosemide  10 mg Intravenous Q12H  . insulin aspart  0-9 Units Subcutaneous TID WC  . insulin  glargine  15 Units Subcutaneous QHS  . isosorbide mononitrate  30 mg Oral BID  . metoprolol succinate  50 mg Oral Daily  . sodium chloride      . sodium chloride      . sodium polystyrene  15 g Oral Once  . DISCONTD: insulin glargine  10 Units Subcutaneous QHS   Continuous Infusions:    . sodium chloride 0.45 % 1,000 mL with sodium bicarbonate 50 mEq infusion     PRN Meds:.acetaminophen, alum & mag hydroxide-simeth, diazepam, guaiFENesin-dextromethorphan, hydrocortisone cream, loperamide, loperamide, magnesium hydroxide, morphine injection, nitroGLYCERIN, ondansetron, phenylephrine-shark liver oil-mineral oil-petrolatum, DISCONTD: acetaminophen Assessment/Plan: Principal Problem:  *Pyelonephritis, acute Active Problems:  HYPERTENSION, UNSPECIFIED  CAD, UNSPECIFIED SITE  Dehydration  Nausea  DM type 2, uncontrolled, with renal complications  Incontinence  Obesity  ARF (acute renal failure)  Chronic kidney disease (CKD), stage III (moderate)  Kidney stones  Continue aztreonam. Urine culture is negative so far. She has pain from kidney stones. Her potassium is elevated at 6.2. A followup potassium level is pending. In the meantime, she will be treated with Kayexalate, IV Lasix, and a mild bicarbonate drip. She does acknowledge that she has stage III chronic kidney disease although there may be an element of acute renal failure superimposed.   LOS: 2 days   Kiondre Grenz 08/04/2011, 9:15 AM

## 2011-08-04 NOTE — Progress Notes (Signed)
   CARE MANAGEMENT NOTE 08/04/2011  Patient:  [name], [name]   Account Number:  [id number]  Date Initiated:  08/04/2011  Documentation initiated by:  Claretha Cooper  Subjective/Objective Assessment:   Pt admitted from home with spouse. admitted with polynephritis and kidney stones.     Action/Plan:   Spoke with pt at bedside. Pt will CONSIDER HH PT but does not want to have it.   Anticipated DC Date:  08/06/2011   Anticipated DC Plan:  [place]  CM consult      Choice offered to / List presented to:             Status of service:  In process, will continue to follow Medicare Important Message given?   (If response is "NO", the following Medicare IM given date fields will be blank) Date Medicare IM given:   Date Additional Medicare IM given:    Discharge Disposition:    Per UR Regulation:    If discussed at Long Length of Stay Meetings, dates discussed:    Comments:  08/04/11 [place]

## 2011-08-04 NOTE — Evaluation (Signed)
Physical Therapy Evaluation Patient Details Name: [name] MRN: [id number] DOB: [birth year] Today's Date: 08/04/2011  Problem List:  Patient Active Problem List  Diagnoses  . DM  . HYPERLIPIDEMIA-MIXED  . HYPERTENSION, UNSPECIFIED  . CAD, UNSPECIFIED SITE  . CAROTID STENOSIS  . PVD  . HEMORRHOIDS  . CONSTIPATION  . HEMATOCHEZIA  . RENAL INSUFFICIENCY  . CHEST PAIN  . CONTRAST DYE ALLERGY, HX OF  . Pyelonephritis, acute  . Dehydration  . Nausea  . DM type 2, uncontrolled, with renal complications  . Incontinence  . Obesity  . ARF (acute renal failure)  . Chronic kidney disease (CKD), stage III (moderate)  . Kidney stones    Past Medical History:  Past Medical History  Diagnosis Date  . Coronary atherosclerosis of unspecified type of vessel, native or graft     MI x2  . Hyperlipidemia, mixed   . Unspecified essential hypertension   . PVD (peripheral vascular disease)   . Hemorrhoids   . Cerebrovascular disease     CVA-left sided weakness 2 years ago  . Anxiety disorder   . Cat allergies   . Glaucoma   . Depression   . Kidney stones   . Chronic kidney disease (CKD), stage III (moderate) 08/04/2011   Past Surgical History:  Past Surgical History  Procedure Date  . Polyectomy   . Laparoectomy   . Cholecystectomy   . Abdominal hysterectomy     PT Assessment/Plan/Recommendation PT Assessment Clinical Impression Statement: Pt was seen for eval and found to be close to functional baseline.  PTA, she reports that she was minimally active due to multiple medical issues including residual L hemiparesis.  Her mobility is limited in the hospital due to L flank pain occurring with movement.  We will see her in the acute venue, but I do not anticipate any PT  or DME needs at d/c.               PT Recommendation/Assessment: Patient will need skilled PT in the acute care venue PT Problem List: Decreased strength;Decreased activity tolerance;Decreased  mobility;Pain Barriers to Discharge: None PT Therapy Diagnosis : Difficulty walking;Generalized weakness PT Plan PT Frequency: Min 3X/week PT Treatment/Interventions: Gait training;Functional mobility training;Therapeutic exercise PT Recommendation Follow Up Recommendations: No PT follow up Equipment Recommended: None recommended by PT PT Goals  Acute Rehab PT Goals PT Goal Formulation: With patient Time For Goal Achievement: 7 days Pt will Ambulate: 51 - 150 feet;with modified independence PT Goal: Ambulate - Progress: Goal set today  PT Evaluation Precautions/Restrictions    Prior Functioning    Prior Function Level of Independence: Independent with gait;Independent with transfers;Requires assistive device for independence;Needs assistance with ADLs;Needs assistance with homemaking Driving: No Vocation: Retired Psychologist, forensic (including Balance) Ambulation/Gait Ambulation/Gait: Yes Ambulation/Gait Assistance: 6: Modified independent (Device/Increase time) Ambulation Distance (Feet): 60 Feet Assistive device: Rolling walker Gait Pattern: Within Functional Limits Gait velocity: WNL    Exercise    End of Session PT - End of Session Equipment Utilized During Treatment: Gait belt Activity Tolerance: Patient tolerated treatment well Patient left: in bed;with call bell in reach;with bed alarm set Nurse Communication: Mobility status for transfers General Behavior During Session: [facility] for tasks performed Cognition: [facility] for tasks performed  Sable Feil 08/04/2011, 2:12 PM

## 2011-08-05 ENCOUNTER — Inpatient Hospital Stay (HOSPITAL_COMMUNITY): Payer: BC Managed Care – PPO

## 2011-08-05 LAB — BASIC METABOLIC PANEL
BUN: 29 mg/dL — ABNORMAL HIGH (ref 6–23)
Creatinine, Ser: 2.24 mg/dL — ABNORMAL HIGH (ref 0.50–1.10)
GFR calc Af Amer: 24 mL/min — ABNORMAL LOW (ref >90)
GFR calc non Af Amer: 21 mL/min — ABNORMAL LOW (ref >90)
Potassium: 5.2 mEq/L — ABNORMAL HIGH (ref 3.5–5.1)

## 2011-08-05 LAB — GLUCOSE, CAPILLARY: Glucose-Capillary: 241 mg/dL — ABNORMAL HIGH (ref 70–99)

## 2011-08-05 IMAGING — CR DG ABDOMEN 1V
1 series · 1 of 1 positions shown · non-contrast
Comparison: CT [DATE]

CLINICAL DATA: Kidney stones, nausea, vomiting.

ABDOMEN - 1 VIEW

[view not recorded]
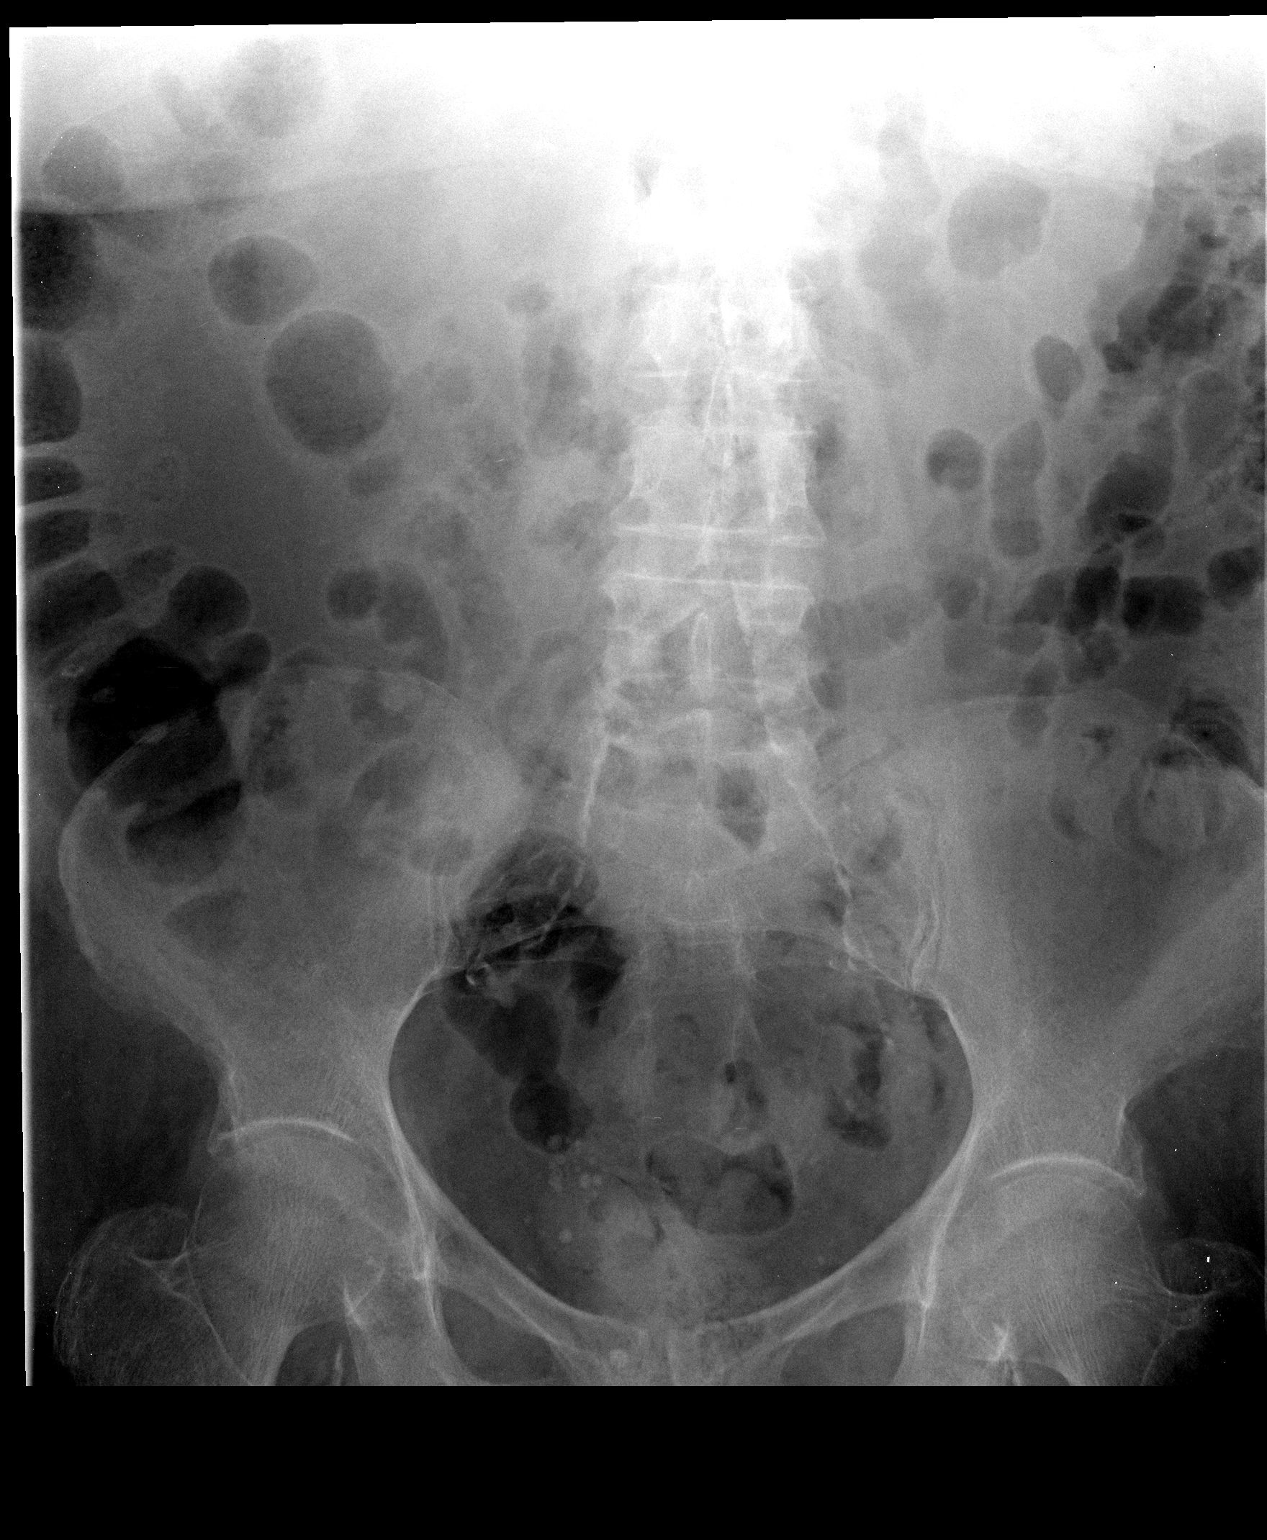

[1 of 1 positions shown; findings below may reference images not displayed]

FINDINGS: Multiple calcifications in the anatomic pelvis correspond
phleboliths on prior CT.  The previously seen small nonobstructing
renal stones cannot be appreciated by plain films.  No evidence of
bowel obstruction.  No supine evidence of free air.  No acute bony
abnormality.
IMPRESSION: No acute findings.

## 2011-08-05 MED ORDER — CIPROFLOXACIN HCL 500 MG PO TABS: 500.0000 mg | ORAL_TABLET | Freq: Two times a day (BID) | ORAL | Status: AC

## 2011-08-05 MED ORDER — SODIUM POLYSTYRENE SULFONATE 15 GM/60ML PO SUSP: 15.0000 g | ORAL | Status: AC

## 2011-08-05 MED ORDER — ONDANSETRON HCL 4 MG PO TABS: 4.0000 mg | ORAL_TABLET | Freq: Three times a day (TID) | ORAL | Status: AC | PRN

## 2011-08-05 MED ORDER — ONDANSETRON 4 MG PO TBDP
4.0000 mg | ORAL_TABLET | Freq: Four times a day (QID) | ORAL | Status: DC | PRN
  Administered 2011-08-05: 4 mg via ORAL
  Filled 2011-08-05: qty 1

## 2011-08-05 MED ORDER — SODIUM POLYSTYRENE SULFONATE 15 GM/60ML PO SUSP: 15.0000 g | ORAL | Status: DC

## 2011-08-05 MED ORDER — CIPROFLOXACIN HCL 500 MG PO TABS: 500.0000 mg | ORAL_TABLET | Freq: Two times a day (BID) | ORAL | Status: DC

## 2011-08-05 MED ORDER — SODIUM POLYSTYRENE SULFONATE 15 GM/60ML PO SUSP
15.0000 g | ORAL | Status: DC
  Filled 2011-08-05: qty 60

## 2011-08-05 MED ORDER — HYDROCODONE-ACETAMINOPHEN 5-500 MG PO TABS: 1.0000 | ORAL_TABLET | Freq: Four times a day (QID) | ORAL | Status: AC | PRN

## 2011-08-05 MED ORDER — CIPROFLOXACIN IN D5W 200 MG/100ML IV SOLN
200.0000 mg | Freq: Two times a day (BID) | INTRAVENOUS | Status: DC
  Administered 2011-08-06: 200 mg via INTRAVENOUS
  Filled 2011-08-05 (×3): qty 100

## 2011-08-05 MED ORDER — SODIUM CHLORIDE 0.9 % IJ SOLN
INTRAMUSCULAR | Status: AC
  Filled 2011-08-05: qty 3

## 2011-08-05 MED ORDER — FUROSEMIDE 20 MG PO TABS: 20.0000 mg | ORAL_TABLET | Freq: Two times a day (BID) | ORAL | Status: DC

## 2011-08-05 NOTE — Discharge Summary (Addendum)
Physician Discharge Summary  LONETTA VOLK MRN: [id number] DOB/AGE: [birth year] 71 y.o.  PCP: Glo Herring., MD, MD   Admit date: 08/02/2011 Discharge date: 08/06/2011  Discharge Diagnoses:  1. Acute pyelonephritis. 2. Kidney stone/nephrolithiasis. 3. Stage III chronic kidney disease. There was an element of acute renal failure superimposed on chronic kidney disease. The patient's creatinine was 2.66 on admission and 2.24 at the time of discharge. 4. Hyperkalemia secondary to chronic kidney disease and ACE inhibitor therapy. The patient's serum potassium was 5.2 prior to discharge. (FOLLOWUP SERUM POTASSIUM LEVEL IS NEEDED IN ONE WEEK.) 5. Flank pain secondary to kidney stones. 6. Nausea associated with pain. 7. Type 2 diabetes mellitus with diabetic nephropathy. Hemoglobin A1c was 6.6. 8. Normocytic anemia. Further evaluation will be deferred to her primary care physician. 9. Morbid obesity. 10. Chronic urinary incontinence.    Medication List  As of 08/06/2011  2:52 PM   STOP taking these medications         enalapril 20 MG tablet         TAKE these medications         amLODipine 10 MG tablet   Commonly known as: NORVASC   Take 10 mg by mouth daily.      aspirin 81 MG tablet   Take 81 mg by mouth daily.      cetirizine 10 MG tablet   Commonly known as: ZYRTEC   Take 10 mg by mouth daily.      ciprofloxacin 500 MG tablet   Commonly known as: CIPRO   Take 1 tablet (500 mg total) by mouth 2 (two) times daily.      clopidogrel 75 MG tablet   Commonly known as: PLAVIX   Take 75 mg by mouth daily.      cyanocobalamin 1000 MCG/ML injection   Commonly known as: (VITAMIN B-12)   Inject 1,000 mcg into the muscle every 30 (thirty) days.      diazepam 10 MG tablet   Commonly known as: VALIUM   Take 10 mg by mouth every 6 (six) hours as needed. Takes 3-4 times per day as needed for being upset. Always takes 1 tablet at bedtime      docusate sodium 100 MG  capsule   Commonly known as: COLACE   Take 100 mg by mouth 2 (two) times daily.      furosemide 20 MG tablet   Commonly known as: LASIX   Take 1 tablet (20 mg total) by mouth 2 (two) times daily. For decreasing your potassium level and for fluid retention.      glipiZIDE 10 MG 24 hr tablet   Commonly known as: GLUCOTROL XL   Take 10 mg by mouth 2 (two) times daily.      HYDROcodone-acetaminophen 5-500 MG per tablet   Commonly known as: VICODIN   Take 1 tablet by mouth every 6 (six) hours as needed for pain.      insulin glargine 100 UNIT/ML injection   Commonly known as: LANTUS   Inject 0-30 Units into the skin at bedtime. Adjusts amount based on her blood sugar levels      isosorbide mononitrate 30 MG 24 hr tablet   Commonly known as: IMDUR   Take 30 mg by mouth 2 (two) times daily.      metoprolol succinate 50 MG 24 hr tablet   Commonly known as: TOPROL-XL   Take 50 mg by mouth daily.      nitroGLYCERIN 0.4 MG SL  tablet   Commonly known as: NITROSTAT   Place 0.4 mg under the tongue every 5 (five) minutes x 3 doses as needed. For chest pain      ondansetron 4 MG tablet   Commonly known as: ZOFRAN   Take 1 tablet (4 mg total) by mouth every 8 (eight) hours as needed for nausea.      rosuvastatin 20 MG tablet   Commonly known as: CRESTOR   Take 20 mg by mouth daily.      sodium polystyrene 15 GM/60ML suspension   Commonly known as: KAYEXALATE   Take 60 mLs (15 g total) by mouth every other day. Take to lower your potassium level.            Discharge Condition: Improved.  Disposition:  home.   Consults: none.   Significant Diagnostic Studies: Ct Abdomen Pelvis Wo Contrast  08/02/2011  *RADIOLOGY REPORT*  Clinical Data: Left flank pain  CT ABDOMEN AND PELVIS WITHOUT CONTRAST  Technique:  Multidetector CT imaging of the abdomen and pelvis was performed following the standard protocol without intravenous contrast.  Comparison: 11/29/2009  Findings:  Lung bases  are unremarkable.  Sagittal images of the spine shows diffuse osteopenia.  No destructive bony lesions are noted. Atherosclerotic calcifications are noted abdominal aorta, SMA origin, bilateral renal artery origin and bilateral iliac arteries. No aortic aneurysm.  There are streak artifacts from the patient's large body habitus.  Unenhanced liver, pancreas spleen and adrenals are unremarkable. Tiny calcified granuloma left hepatic lobe is stable.  Splenic artery calcifications are noted.  A small hiatal hernia again noted.  Scattered colonic diverticula. Vascular calcifications are again noted in the left renal hilum.  Again noted bilateral renal atrophy with significant cortical thinning.  At least three nonobstructive calcifications are noted within the right kidney.  Nonobstructive calcification in the upper pole posterior aspect measures 2 mm.  Nonobstructive calcification in the lower pole right kidney measures 2.5 mm.  Nonobstructive calcification in the lower pole of the left kidney measures 2.9 mm.  No hydronephrosis or hydroureter.  Bilateral no calcified ureteral calculi are identified.  There is no evidence of acute diverticulitis.  No aortic aneurysm.  No small bowel obstruction.  No pericecal inflammation.  The patient status post hysterectomy.  No calcified calculi are noted within the urinary bladder.  Bilateral small inguinal hernia containing fat is noted without evidence of acute complication.  No destructive bony lesions are noted within pelvis.  Degenerative changes bilateral SI joints. There is a small umbilical hernia containing fat without evidence of acute complication.  IMPRESSION:  1.  Again noted bilateral renal atrophy and bilateral cortical thinning.  Nonobstructive nephrolithiasis. 2.  No hydronephrosis or hydroureter.  No calcified ureteral calculi are identified. 3.  Atherosclerotic vascular calcifications are noted.  4.  Scattered colonic diverticula are noted without evidence of  acute diverticulitis. 5.  Status post hysterectomy.  Original Report Authenticated By: Lahoma Crocker, M.D.     Microbiology: Recent Results (from the past 240 hour(s))  URINE CULTURE     Status: Normal   Collection Time   08/02/11  9:07 AM      Component Value Range Status Comment   Specimen Description URINE, CATHETERIZED   Final    Special Requests NONE   Final    Culture  Setup Time [id number]   Final    Colony Count NO GROWTH   Final    Culture NO GROWTH   Final    Report Status 08/03/2011  FINAL   Final      Labs: Results for orders placed during the hospital encounter of 08/02/11 (from the past 48 hour(s))  GLUCOSE, CAPILLARY     Status: Abnormal   Collection Time   08/04/11  4:07 PM      Component Value Range Comment   Glucose-Capillary 185 (*) 70 - 99 (mg/dL)   GLUCOSE, CAPILLARY     Status: Abnormal   Collection Time   08/04/11  8:55 PM      Component Value Range Comment   Glucose-Capillary 194 (*) 70 - 99 (mg/dL)    Comment 1 Documented in Chart      Comment 2 Notify RN     BASIC METABOLIC PANEL     Status: Abnormal   Collection Time   08/05/11  6:22 AM      Component Value Range Comment   Sodium 135  135 - 145 (mEq/L)    Potassium 5.2 (*) 3.5 - 5.1 (mEq/L)    Chloride 101  96 - 112 (mEq/L)    CO2 25  19 - 32 (mEq/L)    Glucose, Bld 170 (*) 70 - 99 (mg/dL)    BUN 29 (*) 6 - 23 (mg/dL)    Creatinine, Ser 2.24 (*) 0.50 - 1.10 (mg/dL)    Calcium 9.4  8.4 - 10.5 (mg/dL)    GFR calc non Af Amer 21 (*) >90 (mL/min)    GFR calc Af Amer 24 (*) >90 (mL/min)   GLUCOSE, CAPILLARY     Status: Abnormal   Collection Time   08/05/11  7:21 AM      Component Value Range Comment   Glucose-Capillary 172 (*) 70 - 99 (mg/dL)    Comment 1 Documented in Chart      Comment 2 Notify RN     GLUCOSE, CAPILLARY     Status: Abnormal   Collection Time   08/05/11 11:21 AM      Component Value Range Comment   Glucose-Capillary 241 (*) 70 - 99 (mg/dL)    Comment 1 Documented in Chart       Comment 2 Notify RN     GLUCOSE, CAPILLARY     Status: Abnormal   Collection Time   08/05/11  4:22 PM      Component Value Range Comment   Glucose-Capillary 178 (*) 70 - 99 (mg/dL)    Comment 1 Documented in Chart      Comment 2 Notify RN     GLUCOSE, CAPILLARY     Status: Abnormal   Collection Time   08/05/11  9:54 PM      Component Value Range Comment   Glucose-Capillary 137 (*) 70 - 99 (mg/dL)   COMPREHENSIVE METABOLIC PANEL     Status: Abnormal   Collection Time   08/06/11  6:08 AM      Component Value Range Comment   Sodium 137  135 - 145 (mEq/L)    Potassium 4.8  3.5 - 5.1 (mEq/L)    Chloride 101  96 - 112 (mEq/L)    CO2 26  19 - 32 (mEq/L)    Glucose, Bld 178 (*) 70 - 99 (mg/dL)    BUN 26 (*) 6 - 23 (mg/dL)    Creatinine, Ser 1.91 (*) 0.50 - 1.10 (mg/dL)    Calcium 9.4  8.4 - 10.5 (mg/dL)    Total Protein 5.9 (*) 6.0 - 8.3 (g/dL)    Albumin 2.9 (*) 3.5 - 5.2 (g/dL)    AST 33  0 - 37 (U/L)    ALT 29  0 - 35 (U/L)    Alkaline Phosphatase 98  39 - 117 (U/L)    Total Bilirubin 0.4  0.3 - 1.2 (mg/dL)    GFR calc non Af Amer 25 (*) >90 (mL/min)    GFR calc Af Amer 29 (*) >90 (mL/min)   CBC     Status: Abnormal   Collection Time   08/06/11  6:08 AM      Component Value Range Comment   WBC 7.0  4.0 - 10.5 (K/uL)    RBC 3.35 (*) 3.87 - 5.11 (MIL/uL)    Hemoglobin 9.9 (*) 12.0 - 15.0 (g/dL)    HCT 30.7 (*) 36.0 - 46.0 (%)    MCV 91.6  78.0 - 100.0 (fL)    MCH 29.6  26.0 - 34.0 (pg)    MCHC 32.2  30.0 - 36.0 (g/dL)    RDW 14.3  11.5 - 15.5 (%)    Platelets 188  150 - 400 (K/uL)   LIPASE, BLOOD     Status: Normal   Collection Time   08/06/11  6:08 AM      Component Value Range Comment   Lipase 32  11 - 59 (U/L)   GLUCOSE, CAPILLARY     Status: Abnormal   Collection Time   08/06/11  7:29 AM      Component Value Range Comment   Glucose-Capillary 166 (*) 70 - 99 (mg/dL)      HPI : The patient is a 71 year old woman with a past medical history significant for kidney stones,  coronary artery disease, and diabetes mellitus, who presented to the emergency department on August 02 2011 with a chief complaint of left flank pain. She had been started on Cipro several days ago for treatment of urinary tract infection. In the emergency department, she was noted to be afebrile and hemodynamically stable. Her lab data were significant for a blood glucose of 224, BUN of 37, creatinine of 2.66, normal LFTs, normal white blood cell count of 10.4, and hemoglobin of 11.3. Her urinalysis revealed no nitrites or leukocytes. CT scan of her abdomen and pelvis revealed bilateral renal atrophy and bilateral cortical thinning, scattered colonic diverticula but no diverticulitis, and nonobstructive nephrolithiasis. She was admitted for further evaluation and management.  HOSPITAL COURSE:  A urine culture was ordered. The patient was started on IV fluid hydration and IV therapy with Atreonam. Her pain was treated with as needed analgesics. Her nausea was treated with as needed antiemetics. Her oral hypoglycemic medications were withheld and Lantus was decreased accordingly until her by mouth intake improved. Her renal function was followed closely as she has a history of chronic kidney disease. She was noted to be anemic but apparently this has been chronic. Further evaluation will be deferred to her primary care physician. Over the course of the hospitalization, her symptoms abated. She continued to have residual left flank pain which was thought to be primarily secondary to nephrolithiasis. She did not endorse passing any stones during the hospitalization.  Her serum potassium increased to a high of 6.2. This was thought to be secondary to previous ACE inhibitor therapy and chronic kidney disease. It is unclear if she received potassium in her IV fluids during the first 24-48 hours of her hospitalization. Nevertheless, she was given an ampule of calcium gluconate, IV furosemide, and Kayexalate. Her serum  potassium improved to 5.2 prior to discharge.  The patient remained afebrile and hemodynamically stable. Her  capillary blood glucose was fairly well controlled in that it was consistently below 200. She was discharged to home on 7 more days of Cipro following the negative urine culture. She was also discharged on Lasix and Kayexalate for ongoing treatment of hyperkalemia. She was instructed to discontinue enalapril. An appointment was made for her to obtain blood work to recheck her potassium level next week at Dr. Florentina Addison office. She will then followup with him in May. She will followup with her primary care physician in 2 weeks. The patient voiced understanding.    Discharge Exam:  Blood pressure 121/55, pulse 83, temperature 98.3 F (36.8 C), temperature source Oral, resp. rate 20, height 4\' 11"  (1.499 m), weight 83.915 kg (185 lb), SpO2 91.00%.  Lungs: Clear to auscultation bilaterally. Heart: S1, S2, with no murmurs rubs or gallops. Abdomen: Positive bowel sounds, obese, soft, mild flank tenderness on the left. No rigidity or masses palpated. Extremities: No pedal edema.    Discharge Orders    Future Orders Please Complete By Expires   Diet - low sodium heart healthy      Diet Carb Modified      Diet - low sodium heart healthy      Increase activity slowly      Discharge instructions      Comments:   Enalapril was stopped because it can raise your potassium level. You will need to have your potassium level checked next week at the nephrologist's- Dr. Florentina Addison office.   Increase activity slowly         Follow-up Information    Follow up with [facility], MD on 08/11/2011. (Come between 9 am- 12 noon for blood work only. Kidney doctor.)    Contact information:   [place] [phone]       Follow up with Glo Herring., MD on 08/19/2011. (At 11:00 am)    Contact information:   [id number] [place] [phone]       Follow up with [facility], MD on 09/01/2011. (At 1:15pm)    Contact information:   [place] [phone]       Follow up with Marissa Nestle, MD on 08/13/2011. (at 11:30)    Contact information:   [address] [place] [phone]             ADDENDUM:  The patient's discharge was postponed yesterday secondary to an episode of nausea and vomiting. The patient thought that this was secondary to pain from her kidney stones. She was treated appropriately with antiemetics. She had no abdominal pain or tenderness on exam. For further evaluation, a KUB was ordered. It revealed no acute abnormalities. Her liver transaminases were within normal limits. Her lipase was within normal limits. Dr. Michela Pitcher was consulted. He evaluated her and recommended ongoing treatment in the outpatient setting with antiemetics and analgesics. He would like to see her back in his office next week. An appointment was made.  On followup evaluation, the patient is hemodynamically stable and afebrile. She tolerated her full liquid diet with no abdominal pain, nausea, or vomiting. Her serum potassium  is now 4.8, significantly improved. Her renal function has improved with a lower BUN and creatinine, although, she does have stage III chronic kidney disease.  We'll continue all other medications and therapies as dictated yesterday. The patient will be discharged to home today with appointments to see her  primary care physician and specialists as scheduled.     Total discharge time: 30 minutes.   Signed: Adaia Matthies 08/06/2011, 2:52 PM

## 2011-08-05 NOTE — Progress Notes (Signed)
Physical Therapy Treatment Patient Details Name: [name] MRN: [id number] DOB: [birth year] Today's Date: 08/05/2011  TIMe: [phone]/ 1 TE 1 GT  PT Assessment/Plan  PT - Assessment/Plan Comments on Treatment Session: Pt fatigues easily this morning during bed exercises and gait training; several rest breaks needed during exercises. Patient amb total of 12' with RW;supervision due to c/o weak LLE .Attempted to have patient do 2nd round of gait after resting however pt became nauseous and needed to lay back down and d/c therapy for today PT Goals  Acute Rehab PT Goals PT Goal: Ambulate - Progress: Not met  PT Treatment Precautions/Restrictions  Precautions Precautions: Fall Required Braces or Orthoses: No Restrictions Weight Bearing Restrictions: No Mobility (including Balance) Bed Mobility Rolling Right: 6: Modified independent (Device/Increase time) Right Sidelying to Sit: 6: Modified independent (Device/Increase time);With rails;HOB elevated (comment degrees) (HOB 35 degrees) Transfers Transfers: Yes Sit to Stand: 6: Modified independent (Device/Increase time);With upper extremity assist Stand to Sit: 6: Modified independent (Device/Increase time);With upper extremity assist Ambulation/Gait Ambulation/Gait: Yes Ambulation/Gait Assistance: 5: Supervision Ambulation/Gait Assistance Details (indicate cue type and reason): verbal cues to keep RW close as well as close guarding due to LLE weakness complaints Ambulation Distance (Feet):  (+3'= 21' total) Assistive device: Rolling walker Stairs: No Wheelchair Mobility Wheelchair Mobility: No    Exercise  General Exercises - Upper Extremity Shoulder Flexion: Both;10 reps General Exercises - Lower Extremity Ankle Circles/Pumps: 20 reps;Both Quad Sets: Both;10 reps Gluteal Sets: 10 reps Long Arc Quad: Both;10 reps Heel Slides: Both (8 reps) Hip ABduction/ADduction: 10 reps;Both Toe Raises: Both;10 reps Heel Raises: 10  reps;Both End of Session PT - End of Session Equipment Utilized During Treatment: Gait belt Activity Tolerance: Patient limited by fatigue;Treatment limited secondary to medical complications (Comment);Other (comment) (nausea) Patient left: in bed;with call bell in reach;with bed alarm set (Doctor present) General Behavior During Session: [facility] for tasks performed Cognition: [facility] for tasks performed  [name], [place] 08/05/2011, 10:28 AM

## 2011-08-05 NOTE — Progress Notes (Signed)
The patient had an episode of nausea and vomiting this afternoon. The plan was to discharge her today, however, discharge will be postponed. A KUB was ordered and it was unremarkable. Will restart IV fluids, IV as needed Zofran, and IV as needed morphine. We'll change her diet to a clear liquid diet. [facility] consult urologist Dr. Michela Pitcher in the morning. Of note, the patient has a history of nausea and vomiting associated with pain from kidney stones.

## 2011-08-06 LAB — LIPASE, BLOOD: Lipase: 32 U/L (ref 11–59)

## 2011-08-06 LAB — CBC
MCH: 29.6 pg (ref 26.0–34.0)
Platelets: 188 10*3/uL (ref 150–400)
RBC: 3.35 MIL/uL — ABNORMAL LOW (ref 3.87–5.11)

## 2011-08-06 LAB — COMPREHENSIVE METABOLIC PANEL
AST: 33 U/L (ref 0–37)
BUN: 26 mg/dL — ABNORMAL HIGH (ref 6–23)
CO2: 26 mEq/L (ref 19–32)
Chloride: 101 mEq/L (ref 96–112)
Creatinine, Ser: 1.91 mg/dL — ABNORMAL HIGH (ref 0.50–1.10)
GFR calc non Af Amer: 25 mL/min — ABNORMAL LOW (ref >90)
Total Bilirubin: 0.4 mg/dL (ref 0.3–1.2)

## 2011-08-06 NOTE — Progress Notes (Signed)
UR Chart Review Completed  

## 2011-08-06 NOTE — Progress Notes (Signed)
Physical Therapy Treatment Patient Details Name: JOVINA MOMPREMIER MRN: [id number] DOB: [birth year] Today's Date: 08/06/2011  Therapy attempted this morning, however pt refused due to nausea and tired from terrible night. Will attempt again at later time when appropriate  PT Assessment/Plan   PT Goals     PT Treatment Precautions/Restrictions  Precautions Precautions: Fall Required Braces or Orthoses: No Restrictions Weight Bearing Restrictions: No Mobility (including Balance)      Exercise    End of Session    Samul Mcinroy, [place] 08/06/2011, 11:38 AM

## 2011-08-06 NOTE — Progress Notes (Signed)
   CARE MANAGEMENT NOTE 08/06/2011  Patient:  [name], [name]   Account Number:  [id number]  Date Initiated:  08/04/2011  Documentation initiated by:  Claretha Cooper  Subjective/Objective Assessment:   Pt admitted from home with spouse. admitted with polynephritis and kidney stones.     Action/Plan:   Spoke with pt at bedside. Pt will CONSIDER HH PT but does not want to have it.   Anticipated DC Date:  08/06/2011   Anticipated DC Plan:  [place]  CM consult      Choice offered to / List presented to:             Status of service:  Completed, signed off Medicare Important Message given?   (If response is "NO", the following Medicare IM given date fields will be blank) Date Medicare IM given:   Date Additional Medicare IM given:    Discharge Disposition:  HOME/SELF CARE  Per UR Regulation:    If discussed at Long Length of Stay Meetings, dates discussed:    Comments:  08/06/11 [place] CM Due to N and V yesterday, pt was not DC'd as planned. Pt to DC today. Declines HH.  08/05/11 1300 Kayla Deshaies RN BSN CM 08/04/11 [place] BSN CM

## 2011-08-07 LAB — GLUCOSE, CAPILLARY: Glucose-Capillary: 196 mg/dL — ABNORMAL HIGH (ref 70–99)

## 2011-08-07 NOTE — Progress Notes (Signed)
Discharge summary sent to payer through [employer]  

## 2012-01-19 ENCOUNTER — Telehealth: Payer: Self-pay

## 2012-01-19 NOTE — Telephone Encounter (Signed)
Pt was returning nurse's call from earlier. Please call her back at [phone]

## 2012-01-19 NOTE — Telephone Encounter (Signed)
LMOM for a return call from pt. She was referred by Dr. Gerarda Fraction for a screening colonoscopy. She had one 09/26/2008. Next was recommended in 5 years or 09/2013. ( letter to pt dated 09/27/2008). Per Neil Crouch, PA, if any problems, consider earlier colonoscopy due to poor but adequate prep.

## 2012-01-20 NOTE — Telephone Encounter (Signed)
Called pt. She is not having any problems and she could not remember when she had her last colonoscopy. She is aware now that next is due in 09/2013. She will call if she has any problems. Faxing info to Dr. Gerarda Fraction. Routing to [facility] to nic for 09/2013.

## 2012-01-20 NOTE — Telephone Encounter (Signed)
Recall made 

## 2012-01-29 ENCOUNTER — Encounter (HOSPITAL_COMMUNITY): Payer: Self-pay | Admitting: Oncology

## 2012-01-29 ENCOUNTER — Encounter (HOSPITAL_COMMUNITY): Payer: BC Managed Care – PPO | Attending: Oncology | Admitting: Oncology

## 2012-01-29 VITALS — BP 167/83 | HR 82 | Temp 98.0°F | Resp 18 | Wt 192.0 lb

## 2012-01-29 DIAGNOSIS — R809 Proteinuria, unspecified: Secondary | ICD-10-CM

## 2012-01-29 DIAGNOSIS — N183 Chronic kidney disease, stage 3 unspecified: Secondary | ICD-10-CM | POA: Insufficient documentation

## 2012-01-29 DIAGNOSIS — I129 Hypertensive chronic kidney disease with stage 1 through stage 4 chronic kidney disease, or unspecified chronic kidney disease: Secondary | ICD-10-CM | POA: Insufficient documentation

## 2012-01-29 DIAGNOSIS — N289 Disorder of kidney and ureter, unspecified: Secondary | ICD-10-CM

## 2012-01-29 DIAGNOSIS — E119 Type 2 diabetes mellitus without complications: Secondary | ICD-10-CM

## 2012-01-29 DIAGNOSIS — D890 Polyclonal hypergammaglobulinemia: Secondary | ICD-10-CM

## 2012-01-29 HISTORY — DX: Polyclonal hypergammaglobulinemia: D89.0

## 2012-01-29 LAB — COMPREHENSIVE METABOLIC PANEL
ALT: 13 U/L (ref 0–35)
Alkaline Phosphatase: 88 U/L (ref 39–117)
GFR calc Af Amer: 30 mL/min — ABNORMAL LOW (ref >90)
Glucose, Bld: 168 mg/dL — ABNORMAL HIGH (ref 70–99)
Potassium: 4.3 mEq/L (ref 3.5–5.1)
Sodium: 138 mEq/L (ref 135–145)
Total Protein: 7.5 g/dL (ref 6.0–8.3)

## 2012-01-29 LAB — CBC WITH DIFFERENTIAL/PLATELET
Eosinophils Absolute: 0.3 10*3/uL (ref 0.0–0.7)
Lymphocytes Relative: 24 % (ref 12–46)
Lymphs Abs: 2.4 10*3/uL (ref 0.7–4.0)
MCH: 29.6 pg (ref 26.0–34.0)
Neutro Abs: 6.4 10*3/uL (ref 1.7–7.7)
Neutrophils Relative %: 62 % (ref 43–77)
Platelets: 223 10*3/uL (ref 150–400)
RBC: 4.23 MIL/uL (ref 3.87–5.11)
WBC: 10.3 10*3/uL (ref 4.0–10.5)

## 2012-01-29 LAB — LACTATE DEHYDROGENASE: LDH: 272 U/L — ABNORMAL HIGH (ref 94–250)

## 2012-01-29 LAB — SEDIMENTATION RATE: Sed Rate: 10 mm/hr (ref 0–22)

## 2012-01-29 NOTE — Progress Notes (Signed)
[place] PATIENT RE-EVALUATION   Name: [name] Date: 01/29/2012 MRN: [id number] DOB: 1940-08-14    CC: Glo Herring., MD, Fran Lowes, MD   DIAGNOSIS: The encounter diagnosis was Polyclonal gammopathy.   HISTORY OF PRESENT ILLNESS:[name] is a 71 y.o. Caucasian female who is sent back to the [facility] after being released in April 2012.  She was seen at that time for a polyclonal gammopathy with an IgG to kappa ratio of 6.04.  She was sent to the [facility] for re-evaluation with continued proteinuria. I contacted Dr. Florentina Addison office to discuss the patient, but he was not available.    Rodolfo denies any complaints.  She was in the hospital in April with acute pyelonephritis and renal nephrolithiasis.  She has recovered from this.  She denies any recent infections requiring antibiotics.  She denies any bone pain.  She remains continent of bowel and bladder.  She denies any anuria.  She denies any recent trauma or bone fractures.  She denies any headaches, dizziness, double vision, fevers, chills, night sweats, nausea, vomiting, diarrhea, constipation, abdominal pain, chest pain, heart palpitations, blood in stool, black tarry stool, urinary pain, urinary burning, urinary frequency, hematuria.   She denies having a 24 hour urine collection recently.  So we will perform an indepth multiple myeloma work-up.  If negative, will release the patient again.       FAMILY HISTORY: family history includes Heart attack in her father and Kidney disease in her mother. Patient's father passed at the age of 6 due to heart complications Mother was 98 when she passed from multiple chronic medical problems.  Brother is 62 living with DM and renal issues.  84 year old sister who is a patient at [facility]. She is the oldest of 12 siblings.  She has 2 children 48 and 72 years old and both have COPD.  No family  history of MM or malignancy.    PAST MEDICAL HISTORY:  has a past medical history of Coronary atherosclerosis of unspecified type of vessel, native or graft; Hyperlipidemia, mixed; Unspecified essential hypertension; PVD (peripheral vascular disease); Hemorrhoids; Cerebrovascular disease; Anxiety disorder; Cat allergies; Glaucoma; Depression; Kidney stones; Chronic kidney disease (CKD), stage III (moderate) (08/04/2011); and Polyclonal gammopathy (01/29/2012).       CURRENT MEDICATIONS: See CHL.   SOCIAL HISTORY:  reports that she has quit smoking. She does not have any smokeless tobacco history on file. She reports that she does not drink alcohol or use illicit drugs.  Born in [place], [place] and lives in [place], [place] now.  Completed High school.  Employed as a Armed forces logistics/support/administrative officer at [employer].  Married x 43 years. Lives with Husband at home. Tobacco abuse history of 30 pack years.  Denies illicit drug abuse and EtOH abuse.     ALLERGIES: Contrast media; Hydromorphone hcl; Biaxin; Cephalexin; Penicillins; and Sulfonamide derivatives   LABORATORY DATA:  CBC    Component Value Date/Time   WBC 7.0 08/06/2011 0608   RBC 3.35* 08/06/2011 0608   HGB 9.9* 08/06/2011 0608   HCT 30.7* 08/06/2011 0608   PLT 188 08/06/2011 0608   MCV 91.6 08/06/2011 0608   MCH 29.6 08/06/2011 0608   MCHC 32.2 08/06/2011 0608   RDW 14.3 08/06/2011 0608   LYMPHSABS 2.3 08/02/2011 0815   MONOABS 1.1* 08/02/2011 0815   EOSABS 0.4 08/02/2011 0815   BASOSABS 0.0 08/02/2011 0815      Chemistry  Component Value Date/Time   NA 137 08/06/2011 0608   K 4.8 08/06/2011 0608   CL 101 08/06/2011 0608   CO2 26 08/06/2011 0608   BUN 26* 08/06/2011 0608   CREATININE 1.91* 08/06/2011 0608      Component Value Date/Time   CALCIUM 9.4 08/06/2011 0608   ALKPHOS 98 08/06/2011 0608   AST 33 08/06/2011 0608   ALT 29 08/06/2011 0608   BILITOT 0.4 08/06/2011 0608         REVIEW OF SYSTEMS: Patient reports no health  concerns.   PHYSICAL EXAM:  weight is 192 lb (87.091 kg). Her oral temperature is 98 F (36.7 C). Her blood pressure is 167/83 and her pulse is 82. Her respiration is 18.  General appearance: alert, cooperative, appears stated age, no distress and moderately obese Head: Normocephalic, without obvious abnormality, atraumatic, edentulous, no cranial lesions palpated. Neck: no adenopathy, supple, symmetrical, trachea midline and thyroid not enlarged, symmetric, no tenderness/mass/nodules Lymph nodes: No cervical, supraclavicular, infraclavicular, and axillary lymph nodes Resp: clear to auscultation bilaterally and normal percussion bilaterally Back: symmetric, no curvature. ROM normal. No CVA tenderness., no tenderness to palpation of spine. Cardio: regular rate and rhythm, S1, S2 normal, no murmur, click, rub or gallop GI: normal findings: bowel sounds normal, no masses palpable, no organomegaly and soft, non-tender Extremities: extremities normal, atraumatic, no cyanosis.  B/L 1+ pitting edema.  No clavicular or sternal lesions palpated. Neurologic: Grossly normal     IMPRESSION:  1. Polyclonal gammopathy in past.  Negative multiple myeloma work up in April 2012 and released from clinic. 2. Stage III Kidney Disease 3. Proteinuria 4. Type 2 DM   PLAN:  1. I personally reviewed and went over laboratory results with the patient. 2. Lab work today: CBC diff, CMET, LDH, ESR, Multiple Myeloma panel with free light chain assay, B-2 Microglobulin. 3. 24 hour urine collection for Bence Jones Proteinuria. 4. Bone survey to evaluate for lytic lesions. 5. Return in 3-4 weeks for follow-up.  If work-up is negative, will release the patient from the clinic at that time.    All questions were answered.  She knows to the call the clinic with any questions or concerns.   Berkleigh Beckles

## 2012-01-29 NOTE — Patient Instructions (Addendum)
[facility]  Discharge Instructions  RECOMMENDATIONS MADE BY THE CONSULTANT AND ANY TEST RESULTS WILL BE SENT TO YOUR REFERRING DOCTOR.   EXAM FINDINGS BY MD TODAY AND SIGNS AND SYMPTOMS TO REPORT TO CLINIC OR PRIMARY MD:  We will do labs today and then bring back your urine collection.  SPECIAL INSTRUCTIONS/FOLLOW-UP: Bone survey in the next couple of weeks Return to see [place] in 3 weeks   I acknowledge that I have been informed and understand all the instructions given to me and received a copy. I do not have any more questions at this time, but understand that I may call the Specialty Clinic at [facility] at [phone] during business hours should I have any further questions or need assistance in obtaining follow-up care.    __________________________________________  _____________  __________ Signature of Patient or Authorized Representative            Date                   Time    __________________________________________ Nurse's Signature

## 2012-02-03 LAB — MULTIPLE MYELOMA PANEL, SERUM
Alpha-1-Globulin: 4.8 % (ref 2.9–4.9)
Alpha-2-Globulin: 14.5 % — ABNORMAL HIGH (ref 7.1–11.8)
Gamma Globulin: 9.4 % — ABNORMAL LOW (ref 11.1–18.8)
IgA: 147 mg/dL (ref 69–380)
IgG (Immunoglobin G), Serum: 717 mg/dL (ref 690–1700)
IgM, Serum: 64 mg/dL (ref 52–322)
Total Protein: 7.3 g/dL (ref 6.0–8.3)

## 2012-02-04 ENCOUNTER — Ambulatory Visit (HOSPITAL_COMMUNITY)
Admission: RE | Admit: 2012-02-04 | Discharge: 2012-02-04 | Disposition: A | Discharge status: Home / Self Care | Payer: BC Managed Care – PPO | Source: Ambulatory Visit | Attending: Oncology | Admitting: Oncology

## 2012-02-04 DIAGNOSIS — D890 Polyclonal hypergammaglobulinemia: Secondary | ICD-10-CM

## 2012-02-04 IMAGING — CR DG BONE SURVEY MET
8 of 10 series · 8 of 10 positions shown · non-contrast
Comparison: CT abdomen pelvis [DATE]

CLINICAL DATA: Evaluate for lytic lesion indicative of myeloma.

METASTATIC BONE SURVEY

[view not recorded (1 of 8)]
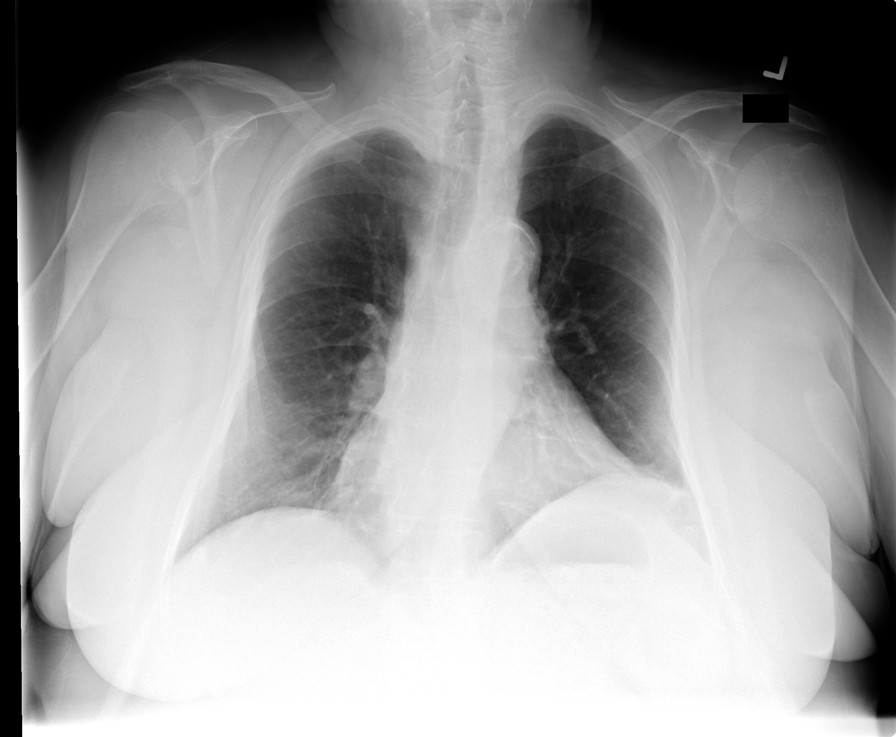

[view not recorded (2 of 8)]
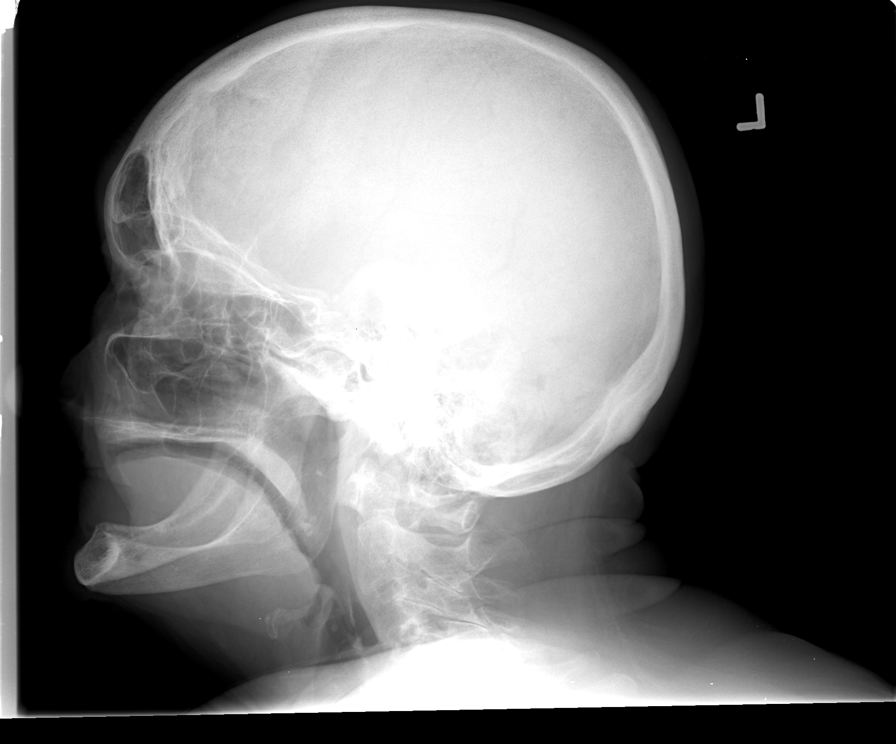

[view not recorded (3 of 8)]
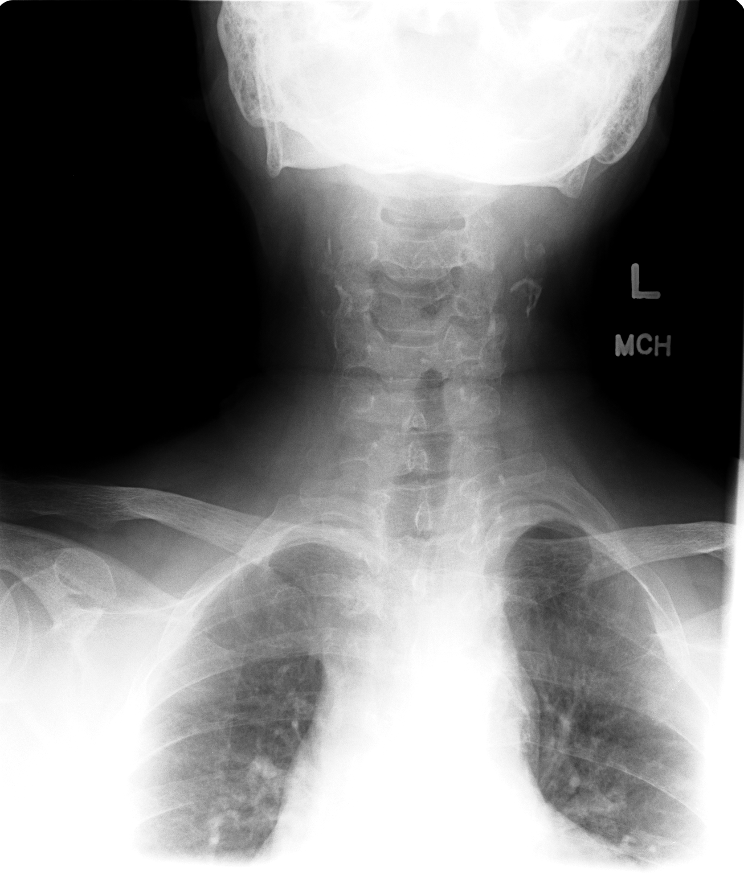

[view not recorded (4 of 8)]
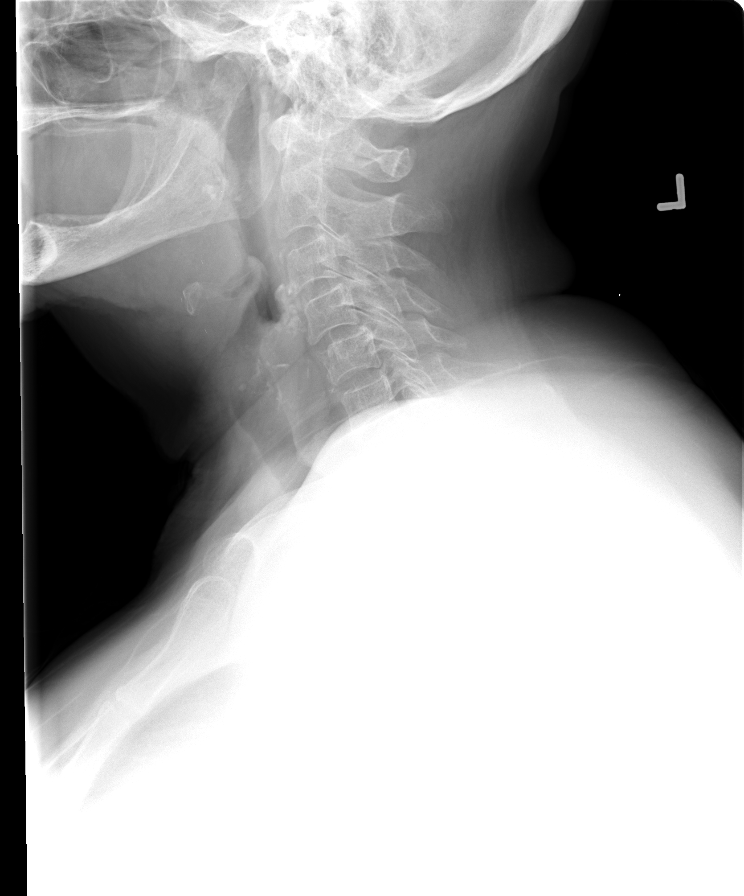

[view not recorded (5 of 8)]
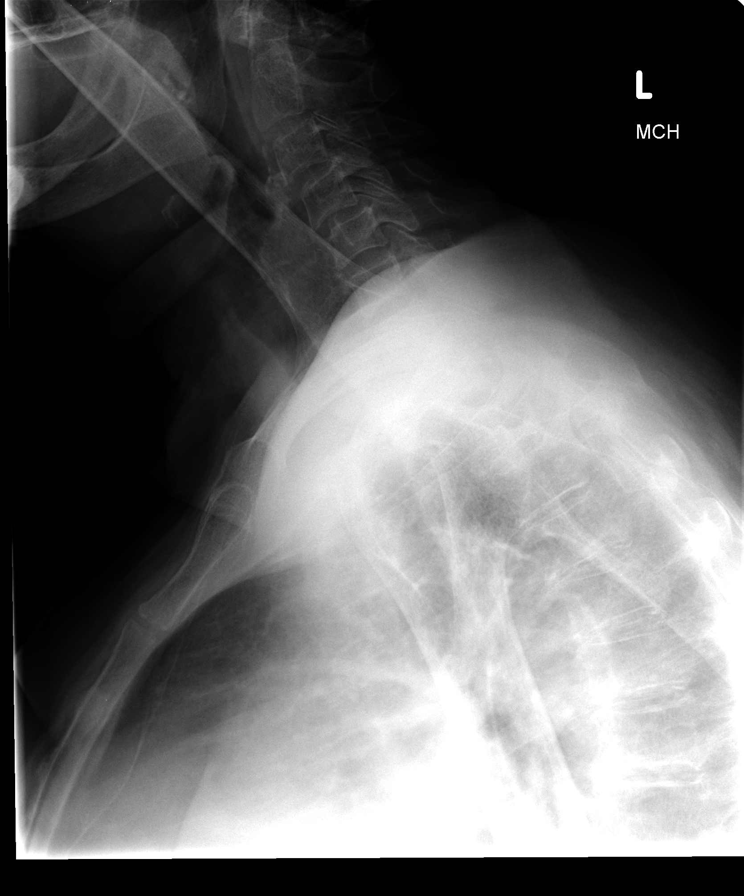

[view not recorded (6 of 8)]
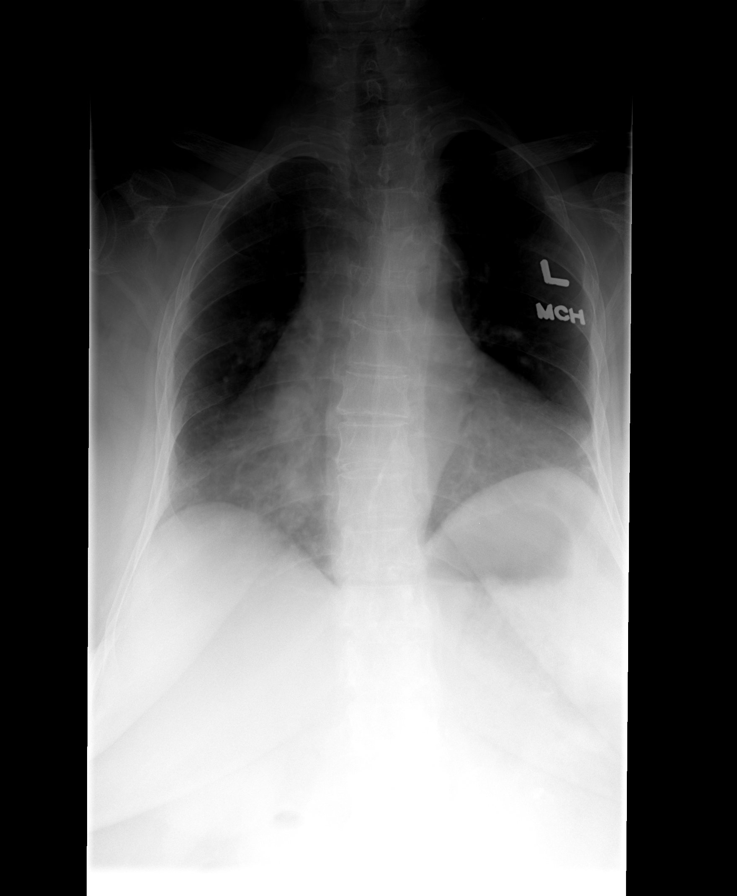

[view not recorded (7 of 8)]
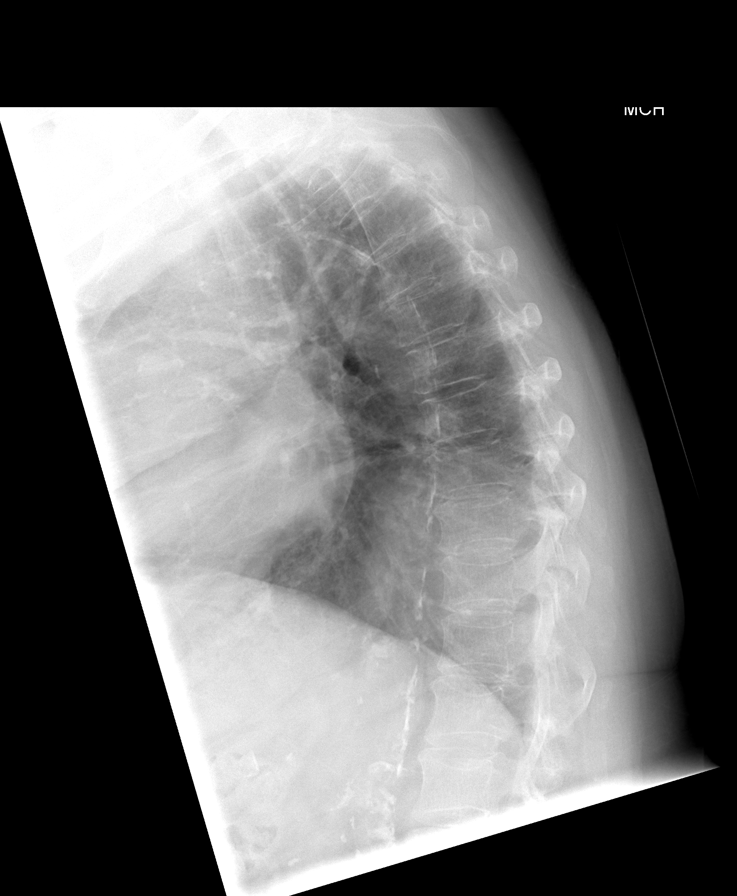

[view not recorded (8 of 8)]
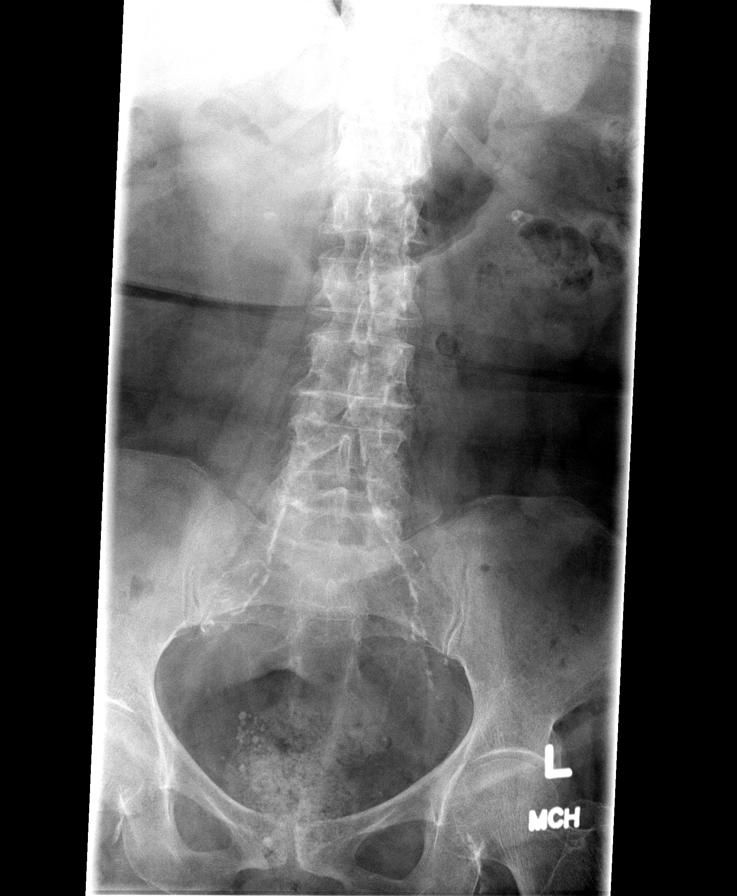

[8 of 10 positions shown; findings below may reference images not displayed]

FINDINGS: No lytic or sclerotic lesions within the skull, cervical
spine, thoracic spine and  lumbar spine to suggest myeloma.  No
lytic or sclerotic lesions within the pelvis, femurs, or humeri.

Extensive atherosclerotic calcifications of the aorta noted.
IMPRESSION: No lytic or sclerotic lesions suggestive or myeloma on skeletal
survey.

## 2012-02-05 LAB — CREATININE CLEARANCE, URINE, 24 HOUR
Collection Interval-CRCL: 24 hours
Creatinine Clearance: 34 mL/min — ABNORMAL LOW (ref 75–115)
Creatinine, 24H Ur: 906 mg/d (ref 700–1800)
Creatinine, Urine: 53.3 mg/dL

## 2012-02-05 NOTE — Addendum Note (Signed)
Addended by: Berneta Levins on: 02/05/2012 11:14 AM   Modules accepted: Orders

## 2012-02-25 ENCOUNTER — Encounter (HOSPITAL_BASED_OUTPATIENT_CLINIC_OR_DEPARTMENT_OTHER): Payer: BC Managed Care – PPO | Admitting: Oncology

## 2012-02-25 VITALS — BP 150/76 | HR 91 | Temp 97.7°F | Resp 18 | Wt 189.0 lb

## 2012-02-25 DIAGNOSIS — D890 Polyclonal hypergammaglobulinemia: Secondary | ICD-10-CM

## 2012-02-25 NOTE — Patient Instructions (Signed)
[facility]  Discharge Instructions  RECOMMENDATIONS MADE BY THE CONSULTANT AND ANY TEST RESULTS WILL BE SENT TO YOUR REFERRING DOCTOR.   EXAM FINDINGS BY MD TODAY AND SIGNS AND SYMPTOMS TO REPORT TO CLINIC OR PRIMARY MD: Exam findings as discussed with T. Kefalas, PA-C.  Thank you for choosing [place] to provide your care, and you are released from our clinic.  Please let us know if there is anything else you need from [place].   I acknowledge that I have been informed and understand all the instructions given to me and received a copy. I do not have any more questions at this time, but understand that I may call the Specialty Clinic at [facility] at [phone] during business hours should I have any further questions or need assistance in obtaining follow-up care.    __________________________________________  _____________  __________ Signature of Patient or Authorized Representative            Date                   Time    __________________________________________ Nurse's Signature

## 2012-02-25 NOTE — Progress Notes (Signed)
Glo Herring., MD [id number] [place] [address]  1. Polyclonal gammopathy     INTERVAL HISTORY: [name] 71 y.o. female returns for  regular  visit for followup of polyclonal gammopathy.  [name], accompanied by her husband, is here to review her results.  A complete muliple myleoma work-up was performed and was negative.  She does not have an M-Spike.  No monoclonal gammopathy.  She does have an elevated B2-microglobulin, but that is nonspecific and likely from her renal disease.  UPEP was unremarkable, along with the SPEP.  Bone survey was performed and no lytic lesions identified.  Kappa, Lambda light chains are unimpressive as well.  CBC is WNL.   With these results, will release the patient from the clinic.  She knows that we are available to her for another consultation in the future if needed.  Thank you for allowing [place] to participate in this nice lady's care.    Past Medical History  Diagnosis Date  . Coronary atherosclerosis of unspecified type of vessel, native or graft     MI x2  . Hyperlipidemia, mixed   . Unspecified essential hypertension   . PVD (peripheral vascular disease)   . Hemorrhoids   . Cerebrovascular disease     CVA-left sided weakness 2 years ago  . Anxiety disorder   . Cat allergies   . Glaucoma(365)   . Depression   . Kidney stones   . Chronic kidney disease (CKD), stage III (moderate) 08/04/2011  . Polyclonal gammopathy 01/29/2012    has DM; HYPERLIPIDEMIA-MIXED; HYPERTENSION, UNSPECIFIED; CAD, UNSPECIFIED SITE; CAROTID STENOSIS; PVD; HEMORRHOIDS; CONSTIPATION; HEMATOCHEZIA; RENAL INSUFFICIENCY; CHEST PAIN; CONTRAST DYE ALLERGY, HX OF; Pyelonephritis, acute; Dehydration; Nausea; DM type 2, uncontrolled, with renal complications; Incontinence; Obesity; ARF (acute renal failure); Chronic kidney disease (CKD), stage III (moderate); Kidney stones; and Polyclonal gammopathy on her problem list.     is allergic to  contrast media; hydromorphone hcl; biaxin; cephalexin; penicillins; and sulfonamide derivatives.  [name] does not currently have medications on file.  Past Surgical History  Procedure Date  . Polyectomy   . Laparoectomy   . Cholecystectomy   . Abdominal hysterectomy     Denies any headaches, dizziness, double vision, fevers, chills, night sweats, nausea, vomiting, diarrhea, constipation, chest pain, heart palpitations, shortness of breath, blood in stool, black tarry stool, urinary pain, urinary burning, urinary frequency, hematuria.   PHYSICAL EXAMINATION  ECOG PERFORMANCE STATUS: 1 - Symptomatic but completely ambulatory  Filed Vitals:   02/25/12 1402  BP: 150/76  Pulse: 91  Temp: 97.7 F (36.5 C)  Resp: 18    GENERAL:alert, no distress, well nourished, well developed, comfortable, cooperative, obese, smiling and very nice and pleasant.  SKIN: skin color, texture, turgor are normal, no rashes or significant lesions HEAD: Normocephalic, No masses, lesions, tenderness or abnormalities EYES: normal, Conjunctiva are pink and non-injected EARS: External ears normal OROPHARYNX:lips, buccal mucosa, and tongue normal and mucous membranes are moist  NECK: supple, trachea midline LYMPH:  not examined BREAST:not examined LUNGS: not examined HEART: not examined ABDOMEN:not examined BACK: not examined EXTREMITIES:not examined  NEURO: alert & oriented x 3 with fluent speech, no focal motor/sensory deficits, in a wheelchair   LABORATORY DATA: Results for [name], [name] (MRN [id number]) as of 02/25/2012 14:24  Ref. Range 01/29/2012 11:09 02/04/2012 14:10 02/05/2012 11:08 02/05/2012 11:09 02/05/2012 11:22  Sodium Latest Range: 135-145 mEq/L 138      Potassium Latest Range: 3.5-5.1 mEq/L 4.3  Chloride Latest Range: 96-112 mEq/L 101      CO2 Latest Range: 19-32 mEq/L 23      BUN Latest Range: 6-23 mg/dL 17      Creatinine Latest Range: 0.50-1.10 mg/dL 1.83 (H)    1.83  (H)  Calcium Latest Range: 8.4-10.5 mg/dL 10.5      GFR calc non Af Amer Latest Range: >90 mL/min 26 (L)      GFR calc Af Amer Latest Range: >90 mL/min 30 (L)      Glucose Latest Range: 70-99 mg/dL 168 (H)      Alkaline Phosphatase Latest Range: 39-117 U/L 88      Albumin Latest Range: 3.5-5.2 g/dL 4.4      AST Latest Range: 0-37 U/L 14      ALT Latest Range: 0-35 U/L 13      Total Protein Latest Range: 6.0-8.3 g/dL 7.5      Total Bilirubin Latest Range: 0.3-1.2 mg/dL 0.7      LDH Latest Range: 94-250 U/L 272 (H)      Beta-2 Microglobulin Latest Range: 1.01-1.73 mg/L 4.44 (H)      Kappa free light chain Latest Range: 0.33-1.94 mg/dL 1.13      Lamda free light chains Latest Range: 0.57-2.63 mg/dL 1.63      Kappa, lamda light chain ratio Latest Range: 0.26-1.65  0.69      WBC Latest Range: 4.0-10.5 K/uL 10.3      RBC Latest Range: 3.87-5.11 MIL/uL 4.23      Hemoglobin Latest Range: 12.0-15.0 g/dL 12.5      HCT Latest Range: 36.0-46.0 % 37.9      MCV Latest Range: 78.0-100.0 fL 89.6      MCH Latest Range: 26.0-34.0 pg 29.6      MCHC Latest Range: 30.0-36.0 g/dL 33.0      RDW Latest Range: 11.5-15.5 % 14.4      Platelets Latest Range: 150-400 K/uL 223      Neutrophils Relative Latest Range: 43-77 % 62      Lymphocytes Relative Latest Range: 12-46 % 24      Monocytes Relative Latest Range: 3-12 % 11      Eosinophils Relative Latest Range: 0-5 % 3      Basophils Relative Latest Range: 0-1 % 1      NEUT# Latest Range: 1.7-7.7 K/uL 6.4      Lymphocytes Absolute Latest Range: 0.7-4.0 K/uL 2.4      Monocytes Absolute Latest Range: 0.1-1.0 K/uL 1.2 (H)      Eosinophils Absolute Latest Range: 0.0-0.7 K/uL 0.3      Basophils Absolute Latest Range: 0.0-0.1 K/uL 0.1      Sed Rate Latest Range: 0-22 mm/hr 10      Creatinine, Urine No range found     53.30  Immunofixation, Urine No range found    (NOTE)   Urine Total Volume-CRCL No range found     1700  Collection Interval-CRCL No range found      24  Creatinine, 24H Ur Latest Range: 762 150 6079 mg/day     906  Creatinine Clearance Latest Range: 75-115 mL/min     34 (L)  Urine Total Volume-UPROT No range found   1700    Collection Interval-UPROT No range found   24    Protein, Urine No range found   45    Protein, 24H Urine Latest Range: 50-100 mg/day   765 (H)    DG BONE SURVEY MET No range found  Rpt  No monoclonal free light chains (Bence Jones Protein) are detected. Urine IFE shows polyclonal increase in free Kappa and/or free Lambda light chains.    RADIOGRAPHIC STUDIES:  02/04/2012  *RADIOLOGY REPORT*  Clinical Data: Evaluate for lytic lesion indicative of myeloma.  METASTATIC BONE SURVEY  Comparison: CT abdomen pelvis 08/02/2011  Findings: No lytic or sclerotic lesions within the skull, cervical  spine, thoracic spine and lumbar spine to suggest myeloma. No  lytic or sclerotic lesions within the pelvis, femurs, or humeri.  Extensive atherosclerotic calcifications of the aorta noted.  IMPRESSION:  No lytic or sclerotic lesions suggestive or myeloma on skeletal  survey.  Original Report Authenticated By: Suzy Bouchard, M.D.     ASSESSMENT:  1. Polyclonal gammopathy    PLAN:  1. I personally reviewed and went over laboratory results with the patient. 2. Release from the clinic.  3. Will send a copy of this note and consult note to her nephrologist. 4.  Would be happy to be re-consulted in the future if indicated.    All questions were answered. The patient knows to call the clinic with any problems, questions or concerns. We can certainly see the patient much sooner if necessary.  The patient and plan discussed with Everardo All, MD and he is in agreement with the aforementioned.  KEFALAS,THOMAS

## 2012-02-26 ENCOUNTER — Ambulatory Visit (HOSPITAL_COMMUNITY): Payer: BC Managed Care – PPO | Admitting: Oncology

## 2012-03-02 ENCOUNTER — Telehealth: Payer: Self-pay | Admitting: Cardiology

## 2012-03-02 ENCOUNTER — Telehealth: Payer: Self-pay

## 2012-03-02 NOTE — Telephone Encounter (Signed)
Received a fax from [facility] requesting documentation on the last colonoscopy. I faxed a copy of the opt note/ path/ and the note from 01/19/2012 to [place].

## 2012-03-02 NOTE — Telephone Encounter (Signed)
Spoke with pt, follow up appt made.

## 2012-03-02 NOTE — Telephone Encounter (Signed)
New Problem:     Patient called in hoping to be seen by Dr. Stanford Breed on a Tuesday sometime this year because she does not know what travel schedule will be next year.  Please call back.

## 2012-03-09 ENCOUNTER — Encounter: Payer: Self-pay | Admitting: Oncology

## 2012-03-15 ENCOUNTER — Other Ambulatory Visit: Payer: Self-pay | Admitting: Cardiology

## 2012-03-15 DIAGNOSIS — I6529 Occlusion and stenosis of unspecified carotid artery: Secondary | ICD-10-CM

## 2012-03-16 ENCOUNTER — Ambulatory Visit (INDEPENDENT_AMBULATORY_CARE_PROVIDER_SITE_OTHER): Payer: BC Managed Care – PPO | Admitting: Cardiology

## 2012-03-16 ENCOUNTER — Encounter: Payer: Self-pay | Admitting: Cardiology

## 2012-03-16 VITALS — BP 158/71 | HR 85 | Ht 60.0 in

## 2012-03-16 DIAGNOSIS — N259 Disorder resulting from impaired renal tubular function, unspecified: Secondary | ICD-10-CM

## 2012-03-16 DIAGNOSIS — E785 Hyperlipidemia, unspecified: Secondary | ICD-10-CM

## 2012-03-16 DIAGNOSIS — I2510 Atherosclerotic heart disease of native coronary artery without angina pectoris: Secondary | ICD-10-CM

## 2012-03-16 DIAGNOSIS — I6529 Occlusion and stenosis of unspecified carotid artery: Secondary | ICD-10-CM

## 2012-03-16 DIAGNOSIS — I10 Essential (primary) hypertension: Secondary | ICD-10-CM

## 2012-03-16 NOTE — Assessment & Plan Note (Signed)
Continue present blood pressure medications. 

## 2012-03-16 NOTE — Patient Instructions (Addendum)
Your physician wants you to follow-up in: [name] will receive a reminder letter in the mail two months in advance. If you don't receive a letter, please call our office to schedule the follow-up appointment.   STOP PLAVIX

## 2012-03-16 NOTE — Assessment & Plan Note (Signed)
Continue aspirin and statin. Discontinue Plavix. 

## 2012-03-16 NOTE — Assessment & Plan Note (Signed)
Continue aspirin and statin. Schedule followup carotid Dopplers. 

## 2012-03-16 NOTE — Assessment & Plan Note (Signed)
Followed by nephrology. 

## 2012-03-16 NOTE — Assessment & Plan Note (Signed)
Continue statin. Lipids and liver monitored by primary care. 

## 2012-03-16 NOTE — Progress Notes (Signed)
HPI: Ms. Murrah returns for followup today. She has a history of coronary artery disease with her last catheterization in September 2008. At that time, she was found to have significant left circumflex disease as well as marginal disease to correlate with perfusion abnormality on her Myoview. However, it was felt that these are small vessels and would be difficult to intervene on either percutaneously or from a surgical standpoint. Her only other disease was in the PDA at 50-70%. Myoview in Sept 2011 showed EF 74; inferolateral infarct and mild to moderate peri-infarct ischemia; results similar to previous and we treated medically. She also has cerebrovascular disease. Last carotid Dopplers were performed in Oct 2012. There was a 40-59% bilateral stenosis. Followup was recommended in one year.  I last saw her in Oct 2012. Since then she has some dyspnea on exertion which is unchanged. No orthopnea, PND, pedal edema or syncope. Occasional chest pain promptly relieved with nitroglycerin unchanged.   Current Outpatient Prescriptions  Medication Sig Dispense Refill  . amLODipine (NORVASC) 10 MG tablet Take 10 mg by mouth daily.        Marland Kitchen aspirin 81 MG tablet Take 81 mg by mouth daily.        . cetirizine (ZYRTEC) 10 MG tablet Take 10 mg by mouth daily.      . clopidogrel (PLAVIX) 75 MG tablet Take 75 mg by mouth daily.        . cyanocobalamin (,VITAMIN B-12,) 1000 MCG/ML injection Inject 1,000 mcg into the muscle every 30 (thirty) days.      . diazepam (VALIUM) 10 MG tablet Take 10 mg by mouth every 6 (six) hours as needed. Takes 3-4 times per day as needed for being upset. Always takes 1 tablet at bedtime      . docusate sodium (COLACE) 100 MG capsule Take 100 mg by mouth 2 (two) times daily.      . furosemide (LASIX) 20 MG tablet Take 1 tablet (20 mg total) by mouth 2 (two) times daily. For decreasing your potassium level and for fluid retention.  30 tablet  1  . glipiZIDE (GLUCOTROL XL) 10 MG 24 hr  tablet Take 10 mg by mouth 2 (two) times daily.       . insulin glargine (LANTUS) 100 UNIT/ML injection Inject 0-30 Units into the skin at bedtime. Adjusts amount based on her blood sugar levels      . isosorbide mononitrate (IMDUR) 30 MG 24 hr tablet Take 30 mg by mouth 2 (two) times daily.        . metoprolol (TOPROL-XL) 50 MG 24 hr tablet Take 50 mg by mouth daily.        . nitroGLYCERIN (NITROSTAT) 0.4 MG SL tablet Place 0.4 mg under the tongue every 5 (five) minutes x 3 doses as needed. For chest pain      . rosuvastatin (CRESTOR) 20 MG tablet Take 20 mg by mouth daily.       . Vitamin D, Ergocalciferol, (DRISDOL) 50000 UNITS CAPS Take 50,000 Units by mouth every 30 (thirty) days.         Past Medical History  Diagnosis Date  . Coronary atherosclerosis of unspecified type of vessel, native or graft     MI x2  . Hyperlipidemia, mixed   . Unspecified essential hypertension   . PVD (peripheral vascular disease)   . Hemorrhoids   . Cerebrovascular disease     CVA-left sided weakness 2 years ago  . Anxiety disorder   .  Cat allergies   . Glaucoma(365)   . Depression   . Kidney stones   . Chronic kidney disease (CKD), stage III (moderate) 08/04/2011  . Polyclonal gammopathy 01/29/2012    Past Surgical History  Procedure Date  . Polyectomy   . Laparoectomy   . Cholecystectomy   . Abdominal hysterectomy     History   Social History  . Marital Status: Married    Spouse Name: N/A    Number of Children: N/A  . Years of Education: N/A   Occupational History  . Retired    Social History Main Topics  . Smoking status: Former Research scientist (life sciences)  . Smokeless tobacco: Not on file  . Alcohol Use: No  . Drug Use: No  . Sexually Active: Not on file   Other Topics Concern  . Not on file   Social History Narrative  . No narrative on file    ROS: no fevers or chills, productive cough, hemoptysis, dysphasia, odynophagia, melena, hematochezia, dysuria, hematuria, rash, seizure activity,  orthopnea, PND, pedal edema, claudication. Remaining systems are negative.  Physical Exam: Well-developed well-nourished in no acute distress.  Skin is warm and dry.  HEENT is normal.  Neck is supple.  Chest is clear to auscultation with normal expansion.  Cardiovascular exam is regular rate and rhythm.  Abdominal exam nontender or distended. No masses palpated. Extremities show no edema. neuro grossly intact  ECG sinus rhythm at a rate of 83. Nonspecific ST changes.

## 2012-03-23 ENCOUNTER — Encounter (INDEPENDENT_AMBULATORY_CARE_PROVIDER_SITE_OTHER): Payer: BC Managed Care – PPO

## 2012-03-23 DIAGNOSIS — I6529 Occlusion and stenosis of unspecified carotid artery: Secondary | ICD-10-CM

## 2012-10-13 ENCOUNTER — Encounter: Payer: Self-pay | Admitting: Cardiology

## 2013-04-11 ENCOUNTER — Encounter: Payer: Self-pay | Admitting: Cardiology

## 2013-04-11 ENCOUNTER — Encounter (INDEPENDENT_AMBULATORY_CARE_PROVIDER_SITE_OTHER): Payer: Self-pay

## 2013-04-11 ENCOUNTER — Ambulatory Visit (INDEPENDENT_AMBULATORY_CARE_PROVIDER_SITE_OTHER): Payer: PRIVATE HEALTH INSURANCE | Admitting: Cardiology

## 2013-04-11 ENCOUNTER — Encounter: Payer: Self-pay | Admitting: Cardiovascular Disease

## 2013-04-11 ENCOUNTER — Ambulatory Visit: Payer: BC Managed Care – PPO | Admitting: Cardiology

## 2013-04-11 ENCOUNTER — Ambulatory Visit (HOSPITAL_COMMUNITY): Payer: PRIVATE HEALTH INSURANCE | Attending: Cardiovascular Disease

## 2013-04-11 VITALS — BP 162/72 | HR 76 | Ht 60.0 in

## 2013-04-11 DIAGNOSIS — I10 Essential (primary) hypertension: Secondary | ICD-10-CM | POA: Insufficient documentation

## 2013-04-11 DIAGNOSIS — R42 Dizziness and giddiness: Secondary | ICD-10-CM | POA: Insufficient documentation

## 2013-04-11 DIAGNOSIS — E119 Type 2 diabetes mellitus without complications: Secondary | ICD-10-CM | POA: Insufficient documentation

## 2013-04-11 DIAGNOSIS — Z8673 Personal history of transient ischemic attack (TIA), and cerebral infarction without residual deficits: Secondary | ICD-10-CM | POA: Insufficient documentation

## 2013-04-11 DIAGNOSIS — I6529 Occlusion and stenosis of unspecified carotid artery: Secondary | ICD-10-CM

## 2013-04-11 DIAGNOSIS — I2510 Atherosclerotic heart disease of native coronary artery without angina pectoris: Secondary | ICD-10-CM

## 2013-04-11 DIAGNOSIS — E785 Hyperlipidemia, unspecified: Secondary | ICD-10-CM

## 2013-04-11 DIAGNOSIS — Z87891 Personal history of nicotine dependence: Secondary | ICD-10-CM | POA: Insufficient documentation

## 2013-04-11 DIAGNOSIS — G8190 Hemiplegia, unspecified affecting unspecified side: Secondary | ICD-10-CM | POA: Insufficient documentation

## 2013-04-11 DIAGNOSIS — I658 Occlusion and stenosis of other precerebral arteries: Secondary | ICD-10-CM | POA: Insufficient documentation

## 2013-04-11 DIAGNOSIS — R0989 Other specified symptoms and signs involving the circulatory and respiratory systems: Secondary | ICD-10-CM | POA: Insufficient documentation

## 2013-04-11 DIAGNOSIS — N183 Chronic kidney disease, stage 3 unspecified: Secondary | ICD-10-CM

## 2013-04-11 DIAGNOSIS — I739 Peripheral vascular disease, unspecified: Secondary | ICD-10-CM | POA: Insufficient documentation

## 2013-04-11 NOTE — Patient Instructions (Signed)
Your physician wants you to follow-up in: ONE YEAR WITH DR CRENSHAW You will receive a reminder letter in the mail two months in advance. If you don't receive a letter, please call our office to schedule the follow-up appointment.  

## 2013-04-11 NOTE — Assessment & Plan Note (Signed)
Continue statin. 

## 2013-04-11 NOTE — Assessment & Plan Note (Signed)
Blood pressure mildly elevated. However she follows this at home and it is typically controlled. Continue present medications.

## 2013-04-11 NOTE — Assessment & Plan Note (Signed)
Continue aspirin and statin. Followup carotid Dopplers.

## 2013-04-11 NOTE — Assessment & Plan Note (Signed)
Continue aspirin and statin. We discussed repeat nuclear study which she would like to avoid at this point. Note that she ever heart catheterization she would be high risk for contrast nephropathy given baseline renal insufficiency and diabetes mellitus.

## 2013-04-11 NOTE — Progress Notes (Signed)
HPI: FU CAD; last catheterization in September 2008. At that time, she was found to have significant left circumflex disease as well as marginal disease to correlate with perfusion abnormality on her Myoview. However, it was felt that these are small vessels and would be difficult to intervene on either percutaneously or from a surgical standpoint. Her only other disease was in the PDA at 50-70%. Myoview in Sept 2011 showed EF 74; inferolateral infarct and mild to moderate peri-infarct ischemia; results similar to previous and we treated medically. She also has cerebrovascular disease. Last carotid Dopplers were performed in Nov 2013. There was a 40-59% bilateral stenosis. Followup was recommended in one year. I last saw her in Nov 2013. Since then she has some dyspnea on exertion which is unchanged. No orthopnea, PND, pedal edema or syncope. Occasional chest pain promptly relieved with nitroglycerin unchanged.   Current Outpatient Prescriptions  Medication Sig Dispense Refill  . amLODipine (NORVASC) 10 MG tablet Take 10 mg by mouth daily.        Marland Kitchen aspirin 81 MG tablet Take 81 mg by mouth daily.        . cetirizine (ZYRTEC) 10 MG tablet Take 10 mg by mouth daily.      . cyanocobalamin (,VITAMIN B-12,) 1000 MCG/ML injection Inject 1,000 mcg into the muscle every 30 (thirty) days.      . diazepam (VALIUM) 10 MG tablet Take 10 mg by mouth every 6 (six) hours as needed. Takes 3-4 times per day as needed for being upset. Always takes 1 tablet at bedtime      . docusate sodium (COLACE) 100 MG capsule Take 100 mg by mouth 2 (two) times daily.      . furosemide (LASIX) 20 MG tablet Take 20 mg by mouth as needed. For decreasing your potassium level and for fluid retention.      Marland Kitchen glipiZIDE (GLUCOTROL XL) 10 MG 24 hr tablet Take 10 mg by mouth 2 (two) times daily.       . insulin glargine (LANTUS) 100 UNIT/ML injection Inject 0-30 Units into the skin at bedtime. Adjusts amount based on her blood sugar  levels      . isosorbide mononitrate (IMDUR) 30 MG 24 hr tablet Take 30 mg by mouth 2 (two) times daily.        Marland Kitchen losartan (COZAAR) 25 MG tablet Take 12.5 mg by mouth daily.      . metoprolol (TOPROL-XL) 50 MG 24 hr tablet Take 50 mg by mouth daily.        . nitroGLYCERIN (NITROSTAT) 0.4 MG SL tablet Place 0.4 mg under the tongue every 5 (five) minutes x 3 doses as needed. For chest pain      . rosuvastatin (CRESTOR) 20 MG tablet Take 20 mg by mouth daily.       . Vitamin D, Ergocalciferol, (DRISDOL) 50000 UNITS CAPS Take 50,000 Units by mouth every 30 (thirty) days.       No current facility-administered medications for this visit.     Past Medical History  Diagnosis Date  . Coronary atherosclerosis of unspecified type of vessel, native or graft     MI x2  . Hyperlipidemia, mixed   . Unspecified essential hypertension   . PVD (peripheral vascular disease)   . Hemorrhoids   . Cerebrovascular disease     CVA-left sided weakness 2 years ago  . Anxiety disorder   . Cat allergies   . Glaucoma   . Depression   .  Kidney stones   . Chronic kidney disease (CKD), stage III (moderate) 08/04/2011  . Polyclonal gammopathy 01/29/2012    Past Surgical History  Procedure Laterality Date  . Polyectomy    . Laparoectomy    . Cholecystectomy    . Abdominal hysterectomy      History   Social History  . Marital Status: Married    Spouse Name: N/A    Number of Children: N/A  . Years of Education: N/A   Occupational History  . Retired    Social History Main Topics  . Smoking status: Former Research scientist (life sciences)  . Smokeless tobacco: Not on file  . Alcohol Use: No  . Drug Use: No  . Sexual Activity: Not on file   Other Topics Concern  . Not on file   Social History Narrative  . No narrative on file    ROS: no fevers or chills, productive cough, hemoptysis, dysphasia, odynophagia, melena, hematochezia, dysuria, hematuria, rash, seizure activity, orthopnea, PND, pedal edema, claudication.  Remaining systems are negative.  Physical Exam: Well-developed well-nourished in no acute distress.  Skin is warm and dry.  HEENT is normal.  Neck is supple.  Chest is clear to auscultation with normal expansion.  Cardiovascular exam is regular rate and rhythm.  Abdominal exam nontender or distended. No masses palpated. Extremities show no edema. neuro grossly intact  ECG sinus rhythm at a rate of 76. Prior inferior infarct. Right axis deviation.

## 2013-04-11 NOTE — Assessment & Plan Note (Signed)
Followed by nephrology. 

## 2013-09-27 ENCOUNTER — Encounter: Payer: Self-pay | Admitting: Internal Medicine

## 2014-04-11 ENCOUNTER — Ambulatory Visit: Payer: PRIVATE HEALTH INSURANCE | Admitting: Gastroenterology

## 2014-08-14 ENCOUNTER — Ambulatory Visit: Payer: PRIVATE HEALTH INSURANCE | Admitting: Cardiology

## 2014-08-14 ENCOUNTER — Encounter (HOSPITAL_COMMUNITY): Payer: PRIVATE HEALTH INSURANCE

## 2014-08-21 ENCOUNTER — Other Ambulatory Visit: Payer: Self-pay | Admitting: Cardiology

## 2014-08-21 DIAGNOSIS — I6523 Occlusion and stenosis of bilateral carotid arteries: Secondary | ICD-10-CM

## 2014-08-24 NOTE — Progress Notes (Signed)
HPI: FU CAD; last catheterization in September 2008. At that time, she was found to have significant left circumflex disease as well as marginal disease to correlate with perfusion abnormality on her Myoview. However, it was felt that these are small vessels and would be difficult to intervene on either percutaneously or from a surgical standpoint. Her only other disease was in the PDA at 50-70%. Myoview in Sept 2011 showed EF 74; inferolateral infarct and mild to moderate peri-infarct ischemia; results similar to previous and we treated medically. She also has cerebrovascular disease. Last carotid Dopplers were performed in Dec 2014. There was a 40-59% bilateral stenosis. Followup was recommended in one year. Since I last saw her, she has some dyspnea on exertion. Occasional chest pain.  Current Outpatient Prescriptions  Medication Sig Dispense Refill  . amLODipine (NORVASC) 10 MG tablet Take 10 mg by mouth daily.      Marland Kitchen aspirin 81 MG tablet Take 81 mg by mouth daily.      . cetirizine (ZYRTEC) 10 MG tablet Take 10 mg by mouth daily.    . cyanocobalamin (,VITAMIN B-12,) 1000 MCG/ML injection Inject 1,000 mcg into the muscle every 30 (thirty) days.    . diazepam (VALIUM) 10 MG tablet Take 10 mg by mouth every 6 (six) hours as needed. Takes 3-4 times per day as needed for being upset. Always takes 1 tablet at bedtime    . docusate sodium (COLACE) 100 MG capsule Take 100 mg by mouth 2 (two) times daily.    Marland Kitchen glipiZIDE (GLUCOTROL XL) 10 MG 24 hr tablet Take 10 mg by mouth 2 (two) times daily.     . insulin glargine (LANTUS) 100 UNIT/ML injection Inject 0-30 Units into the skin at bedtime. Adjusts amount based on her blood sugar levels    . isosorbide mononitrate (IMDUR) 30 MG 24 hr tablet Take 30 mg by mouth 2 (two) times daily.      Marland Kitchen losartan (COZAAR) 25 MG tablet Take 100 mg by mouth daily.     . metoprolol (TOPROL-XL) 50 MG 24 hr tablet Take 50 mg by mouth daily.      . nitroGLYCERIN  (NITROSTAT) 0.4 MG SL tablet Place 0.4 mg under the tongue every 5 (five) minutes x 3 doses as needed. For chest pain    . rosuvastatin (CRESTOR) 20 MG tablet Take 20 mg by mouth daily.     . Vitamin D, Ergocalciferol, (DRISDOL) 50000 UNITS CAPS Take 50,000 Units by mouth every 30 (thirty) days.    . furosemide (LASIX) 20 MG tablet Take 20 mg by mouth as needed. For decreasing your potassium level and for fluid retention.     No current facility-administered medications for this visit.     Past Medical History  Diagnosis Date  . Coronary atherosclerosis of unspecified type of vessel, native or graft     MI x2  . Hyperlipidemia, mixed   . Unspecified essential hypertension   . PVD (peripheral vascular disease)   . Hemorrhoids   . Cerebrovascular disease     CVA-left sided weakness 2 years ago  . Anxiety disorder   . Cat allergies   . Glaucoma   . Depression   . Kidney stones   . Chronic kidney disease (CKD), stage III (moderate) 08/04/2011  . Polyclonal gammopathy 01/29/2012    Past Surgical History  Procedure Laterality Date  . Polyectomy    . Laparoectomy    . Cholecystectomy    . Abdominal hysterectomy  History   Social History  . Marital Status: Married    Spouse Name: N/A  . Number of Children: N/A  . Years of Education: N/A   Occupational History  . Retired    Social History Main Topics  . Smoking status: Former Research scientist (life sciences)  . Smokeless tobacco: Not on file  . Alcohol Use: No  . Drug Use: No  . Sexual Activity: Not on file   Other Topics Concern  . Not on file   Social History Narrative    ROS: no fevers or chills, productive cough, hemoptysis, dysphasia, odynophagia, melena, hematochezia, dysuria, hematuria, rash, seizure activity, orthopnea, PND, pedal edema, claudication. Remaining systems are negative.  Physical Exam: Well-developed obese in no acute distress.  Skin is warm and dry.  HEENT is normal.  Neck is supple.  Chest is clear to  auscultation with normal expansion.  Cardiovascular exam is regular rate and rhythm.  Abdominal exam nontender or distended. No masses palpated. Extremities show trace edema. neuro grossly intact  ECG normal sinus rhythm, nonspecific ST changes.

## 2014-08-29 ENCOUNTER — Ambulatory Visit (HOSPITAL_COMMUNITY)
Admission: RE | Admit: 2014-08-29 | Discharge: 2014-08-29 | Disposition: A | Discharge status: Home / Self Care | Payer: PRIVATE HEALTH INSURANCE | Source: Ambulatory Visit | Attending: Cardiology | Admitting: Cardiology

## 2014-08-29 ENCOUNTER — Other Ambulatory Visit (HOSPITAL_COMMUNITY): Payer: PRIVATE HEALTH INSURANCE

## 2014-08-29 ENCOUNTER — Encounter (HOSPITAL_COMMUNITY): Payer: PRIVATE HEALTH INSURANCE

## 2014-08-29 ENCOUNTER — Encounter: Payer: Self-pay | Admitting: *Deleted

## 2014-08-29 ENCOUNTER — Ambulatory Visit (INDEPENDENT_AMBULATORY_CARE_PROVIDER_SITE_OTHER): Payer: PRIVATE HEALTH INSURANCE | Admitting: Cardiology

## 2014-08-29 ENCOUNTER — Encounter: Payer: Self-pay | Admitting: Cardiology

## 2014-08-29 VITALS — BP 140/64 | HR 70 | Ht 60.75 in | Wt 173.7 lb

## 2014-08-29 DIAGNOSIS — I2510 Atherosclerotic heart disease of native coronary artery without angina pectoris: Secondary | ICD-10-CM

## 2014-08-29 DIAGNOSIS — I6523 Occlusion and stenosis of bilateral carotid arteries: Secondary | ICD-10-CM | POA: Insufficient documentation

## 2014-08-29 DIAGNOSIS — I10 Essential (primary) hypertension: Secondary | ICD-10-CM | POA: Diagnosis not present

## 2014-08-29 NOTE — Patient Instructions (Signed)
Your physician wants you to follow-up in: ONE YEAR WITH DR CRENSHAW You will receive a reminder letter in the mail two months in advance. If you don't receive a letter, please call our office to schedule the follow-up appointment.   Your physician has requested that you have a lexiscan myoview. For further information please visit [email or web address]. Please follow instruction sheet, as given.   

## 2014-08-29 NOTE — Assessment & Plan Note (Signed)
Continue statin. Lipids and liver monitored by primary care. 

## 2014-08-29 NOTE — Assessment & Plan Note (Signed)
Continue aspirin and statin. Follow-up carotid Dopplers performed today.

## 2014-08-29 NOTE — Assessment & Plan Note (Addendum)
Continue aspirin and statin. Schedule nuclear study for risk stratification. We will compare to previous and unchanged plan medical therapy. She would be high risk for contrast nephropathy if catheterization required.

## 2014-08-29 NOTE — Assessment & Plan Note (Signed)
Blood pressure controlled. Continue present medications. 

## 2014-09-07 ENCOUNTER — Encounter: Payer: Self-pay | Admitting: Cardiology

## 2014-09-08 ENCOUNTER — Telehealth (HOSPITAL_COMMUNITY): Payer: Self-pay

## 2014-09-08 NOTE — Telephone Encounter (Signed)
Encounter complete. 

## 2014-09-13 ENCOUNTER — Ambulatory Visit (HOSPITAL_COMMUNITY)
Admission: RE | Admit: 2014-09-13 | Discharge: 2014-09-13 | Disposition: A | Discharge status: Home / Self Care | Payer: PRIVATE HEALTH INSURANCE | Source: Ambulatory Visit | Attending: Cardiology | Admitting: Cardiology

## 2014-09-13 DIAGNOSIS — E119 Type 2 diabetes mellitus without complications: Secondary | ICD-10-CM | POA: Insufficient documentation

## 2014-09-13 DIAGNOSIS — R0609 Other forms of dyspnea: Secondary | ICD-10-CM | POA: Insufficient documentation

## 2014-09-13 DIAGNOSIS — I2510 Atherosclerotic heart disease of native coronary artery without angina pectoris: Secondary | ICD-10-CM | POA: Diagnosis present

## 2014-09-13 DIAGNOSIS — R079 Chest pain, unspecified: Secondary | ICD-10-CM | POA: Insufficient documentation

## 2014-09-13 DIAGNOSIS — R9439 Abnormal result of other cardiovascular function study: Secondary | ICD-10-CM | POA: Diagnosis not present

## 2014-09-13 DIAGNOSIS — R110 Nausea: Secondary | ICD-10-CM | POA: Diagnosis not present

## 2014-09-13 DIAGNOSIS — I10 Essential (primary) hypertension: Secondary | ICD-10-CM | POA: Insufficient documentation

## 2014-09-13 LAB — MYOCARDIAL PERFUSION IMAGING
CHL CUP NUCLEAR SDS: 9
CHL CUP NUCLEAR SSS: 11
CHL CUP RESTING HR STRESS: 73 {beats}/min
CHL CUP STRESS STAGE 1 GRADE: 0 %
CHL CUP STRESS STAGE 1 HR: 90 {beats}/min
CHL CUP STRESS STAGE 2 SPEED: 0 mph
CHL CUP STRESS STAGE 3 SPEED: 0 mph
CHL CUP STRESS STAGE 4 GRADE: 0 %
CHL CUP STRESS STAGE 4 HR: 84 {beats}/min
CSEPPMHR: 69 %
Estimated workload: 1 METS
LV dias vol: 64 mL
LV sys vol: 15 mL
Nuc Stress EF: 76 %
Peak HR: 100 {beats}/min
SRS: 2
Stage 1 DBP: 73 mmHg
Stage 1 SBP: 178 mmHg
Stage 1 Speed: 0 mph
Stage 2 Grade: 0 %
Stage 2 HR: 90 {beats}/min
Stage 3 Grade: 0 %
Stage 3 HR: 100 {beats}/min
Stage 4 DBP: 74 mmHg
Stage 4 SBP: 168 mmHg
Stage 4 Speed: 0 mph
TID: 0.93

## 2014-09-13 IMAGING — NM NM MISC PROCEDURE
6 series · 36 of 36 positions shown · non-contrast
Comparison: none

[Series 1: wbr rest · 6.40mm/px · 6 of 64 frames shown]
[frame 6/64]
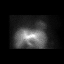
[frame 16/64]
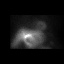
[frame 27/64]
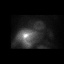
[frame 38/64]
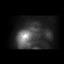
[frame 48/64]
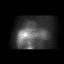
[frame 59/64]
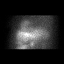

[Series 1: wbr_r-proj_st wbr rest · 6.40mm/px · 6 of 64 frames shown]
[frame 6/64]
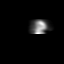
[frame 16/64]
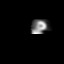
[frame 27/64]
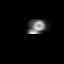
[frame 38/64]
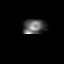
[frame 48/64]
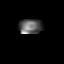
[frame 59/64]
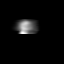

[Series 2: wbr_s-proj_st wbr stress-gsp · 6.40mm/px · 6 of 512 frames shown]
[frame 43/512]
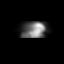
[frame 128/512]
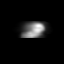
[frame 214/512]
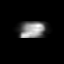
[frame 299/512]
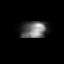
[frame 384/512]
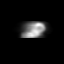
[frame 470/512]
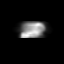

[Series 2: wbr stress-gsp · 6.40mm/px · 6 of 512 frames shown]
[frame 43/512]
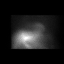
[frame 128/512]
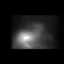
[frame 214/512]
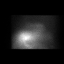
[frame 299/512]
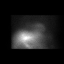
[frame 384/512]
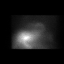
[frame 470/512]
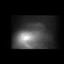

[Series 3: wbr_s-proj_st wbr stress-sum-em · 6.40mm/px · 6 of 64 frames shown]
[frame 6/64]
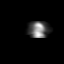
[frame 16/64]
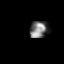
[frame 27/64]
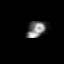
[frame 38/64]
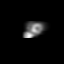
[frame 48/64]
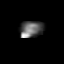
[frame 59/64]
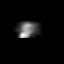

[Series 3: wbr stress-sum-em · 6.40mm/px · 6 of 64 frames shown]
[frame 6/64]
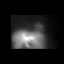
[frame 16/64]
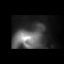
[frame 27/64]
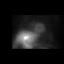
[frame 38/64]
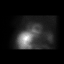
[frame 48/64]
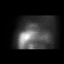
[frame 59/64]
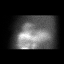

[36 of 36 positions shown; findings below may reference images not displayed]

Canned report from images found in remote index.

Refer to host system for actual result text.

## 2014-09-13 MED ORDER — REGADENOSON 0.4 MG/5ML IV SOLN
0.4000 mg | Freq: Once | INTRAVENOUS | Status: AC
  Administered 2014-09-13: 0.4 mg via INTRAVENOUS

## 2014-09-13 MED ORDER — TECHNETIUM TC 99M SESTAMIBI GENERIC - CARDIOLITE
10.2000 | Freq: Once | INTRAVENOUS | Status: AC | PRN
  Administered 2014-09-13: 10 via INTRAVENOUS

## 2014-09-13 MED ORDER — AMINOPHYLLINE 25 MG/ML IV SOLN
75.0000 mg | Freq: Once | INTRAVENOUS | Status: AC
  Administered 2014-09-13: 75 mg via INTRAVENOUS

## 2014-09-13 MED ORDER — TECHNETIUM TC 99M SESTAMIBI GENERIC - CARDIOLITE
31.6000 | Freq: Once | INTRAVENOUS | Status: AC | PRN
  Administered 2014-09-13: 32 via INTRAVENOUS

## 2014-11-03 ENCOUNTER — Emergency Department (HOSPITAL_COMMUNITY)
Admission: EM | Admit: 2014-11-03 | Discharge: 2014-11-03 | Disposition: A | Discharge status: Home / Self Care | Payer: PRIVATE HEALTH INSURANCE | Attending: Emergency Medicine | Admitting: Emergency Medicine

## 2014-11-03 ENCOUNTER — Encounter (HOSPITAL_COMMUNITY): Payer: Self-pay | Admitting: Emergency Medicine

## 2014-11-03 ENCOUNTER — Emergency Department (HOSPITAL_COMMUNITY): Payer: PRIVATE HEALTH INSURANCE

## 2014-11-03 DIAGNOSIS — Z8719 Personal history of other diseases of the digestive system: Secondary | ICD-10-CM | POA: Diagnosis not present

## 2014-11-03 DIAGNOSIS — F329 Major depressive disorder, single episode, unspecified: Secondary | ICD-10-CM | POA: Insufficient documentation

## 2014-11-03 DIAGNOSIS — I129 Hypertensive chronic kidney disease with stage 1 through stage 4 chronic kidney disease, or unspecified chronic kidney disease: Secondary | ICD-10-CM | POA: Insufficient documentation

## 2014-11-03 DIAGNOSIS — R110 Nausea: Secondary | ICD-10-CM | POA: Insufficient documentation

## 2014-11-03 DIAGNOSIS — Z87442 Personal history of urinary calculi: Secondary | ICD-10-CM | POA: Diagnosis not present

## 2014-11-03 DIAGNOSIS — Z794 Long term (current) use of insulin: Secondary | ICD-10-CM | POA: Diagnosis not present

## 2014-11-03 DIAGNOSIS — H409 Unspecified glaucoma: Secondary | ICD-10-CM | POA: Diagnosis not present

## 2014-11-03 DIAGNOSIS — N390 Urinary tract infection, site not specified: Secondary | ICD-10-CM

## 2014-11-03 DIAGNOSIS — I739 Peripheral vascular disease, unspecified: Secondary | ICD-10-CM | POA: Insufficient documentation

## 2014-11-03 DIAGNOSIS — Z880 Allergy status to penicillin: Secondary | ICD-10-CM | POA: Insufficient documentation

## 2014-11-03 DIAGNOSIS — Z79899 Other long term (current) drug therapy: Secondary | ICD-10-CM | POA: Insufficient documentation

## 2014-11-03 DIAGNOSIS — Z7982 Long term (current) use of aspirin: Secondary | ICD-10-CM | POA: Insufficient documentation

## 2014-11-03 DIAGNOSIS — Z87891 Personal history of nicotine dependence: Secondary | ICD-10-CM | POA: Diagnosis not present

## 2014-11-03 DIAGNOSIS — N183 Chronic kidney disease, stage 3 (moderate): Secondary | ICD-10-CM | POA: Diagnosis not present

## 2014-11-03 DIAGNOSIS — E782 Mixed hyperlipidemia: Secondary | ICD-10-CM | POA: Diagnosis not present

## 2014-11-03 DIAGNOSIS — I2510 Atherosclerotic heart disease of native coronary artery without angina pectoris: Secondary | ICD-10-CM | POA: Diagnosis not present

## 2014-11-03 DIAGNOSIS — R109 Unspecified abdominal pain: Secondary | ICD-10-CM

## 2014-11-03 DIAGNOSIS — F419 Anxiety disorder, unspecified: Secondary | ICD-10-CM | POA: Insufficient documentation

## 2014-11-03 DIAGNOSIS — Z862 Personal history of diseases of the blood and blood-forming organs and certain disorders involving the immune mechanism: Secondary | ICD-10-CM | POA: Diagnosis not present

## 2014-11-03 LAB — COMPREHENSIVE METABOLIC PANEL
ALBUMIN: 4.2 g/dL (ref 3.5–5.0)
ALK PHOS: 86 U/L (ref 38–126)
ALT: 15 U/L (ref 14–54)
ANION GAP: 8 (ref 5–15)
AST: 17 U/L (ref 15–41)
BUN: 40 mg/dL — AB (ref 6–20)
CO2: 24 mmol/L (ref 22–32)
Calcium: 9.4 mg/dL (ref 8.9–10.3)
Chloride: 103 mmol/L (ref 101–111)
Creatinine, Ser: 2.12 mg/dL — ABNORMAL HIGH (ref 0.44–1.00)
GFR calc Af Amer: 25 mL/min — ABNORMAL LOW (ref >60)
GFR calc non Af Amer: 22 mL/min — ABNORMAL LOW (ref >60)
Glucose, Bld: 220 mg/dL — ABNORMAL HIGH (ref 65–99)
Potassium: 4.8 mmol/L (ref 3.5–5.1)
Sodium: 135 mmol/L (ref 135–145)
Total Bilirubin: 1 mg/dL (ref 0.3–1.2)
Total Protein: 7 g/dL (ref 6.5–8.1)

## 2014-11-03 LAB — CBC WITH DIFFERENTIAL/PLATELET
BASOS PCT: 0 % (ref 0–1)
Basophils Absolute: 0 10*3/uL (ref 0.0–0.1)
Eosinophils Absolute: 0.2 10*3/uL (ref 0.0–0.7)
Eosinophils Relative: 2 % (ref 0–5)
HCT: 35.5 % — ABNORMAL LOW (ref 36.0–46.0)
HEMOGLOBIN: 11.7 g/dL — AB (ref 12.0–15.0)
LYMPHS ABS: 2.3 10*3/uL (ref 0.7–4.0)
Lymphocytes Relative: 21 % (ref 12–46)
MCH: 29.2 pg (ref 26.0–34.0)
MCHC: 33 g/dL (ref 30.0–36.0)
MCV: 88.5 fL (ref 78.0–100.0)
MONO ABS: 1 10*3/uL (ref 0.1–1.0)
Monocytes Relative: 10 % (ref 3–12)
Neutro Abs: 7.3 10*3/uL (ref 1.7–7.7)
Neutrophils Relative %: 67 % (ref 43–77)
Platelets: 236 10*3/uL (ref 150–400)
RBC: 4.01 MIL/uL (ref 3.87–5.11)
RDW: 14.2 % (ref 11.5–15.5)
WBC: 10.9 10*3/uL — ABNORMAL HIGH (ref 4.0–10.5)

## 2014-11-03 LAB — URINE MICROSCOPIC-ADD ON

## 2014-11-03 LAB — URINALYSIS, ROUTINE W REFLEX MICROSCOPIC
Bilirubin Urine: NEGATIVE
GLUCOSE, UA: NEGATIVE mg/dL
Ketones, ur: NEGATIVE mg/dL
Nitrite: NEGATIVE
Protein, ur: 30 mg/dL — AB
SPECIFIC GRAVITY, URINE: 1.01 (ref 1.005–1.030)
Urobilinogen, UA: 0.2 mg/dL (ref 0.0–1.0)
pH: 6 (ref 5.0–8.0)

## 2014-11-03 LAB — LIPASE, BLOOD: Lipase: 32 U/L (ref 22–51)

## 2014-11-03 IMAGING — CT CT RENAL STONE PROTOCOL
2 of 4 series · 17 of 46 positions shown, 19 images · non-contrast
Comparison: [DATE]

CLINICAL DATA: Bilateral flank pain, right side greater than left,
for several days. Nephrolithiasis. Chronic kidney disease stage 3.

EXAM:
CT ABDOMEN AND PELVIS WITHOUT CONTRAST
TECHNIQUE: Multidetector CT imaging of the abdomen and pelvis was performed
following the standard protocol without IV contrast.

[Series 2: standard/full over (age)lbs 5.0 · axial · 0.82mm/px · z∈[+438,+858]mm · 14 of 92 slices shown, 16 images]
[im 4/92  soft-tissue]
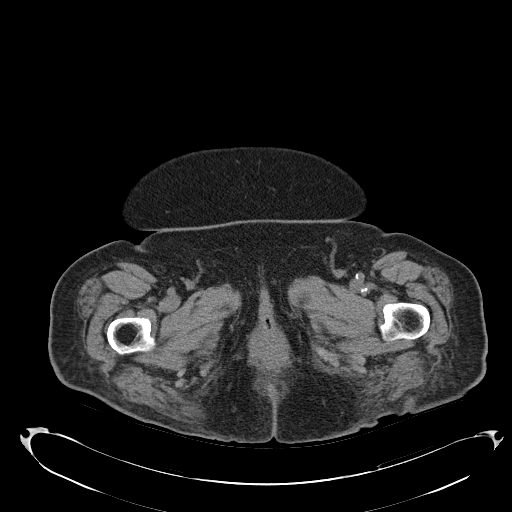
[im 4/92  bone]
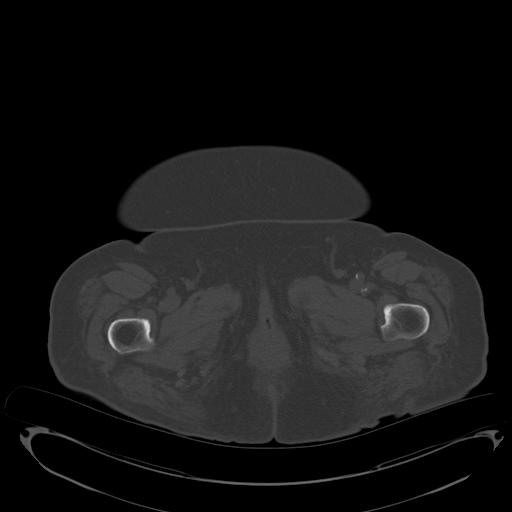
[im 12/92  soft-tissue]
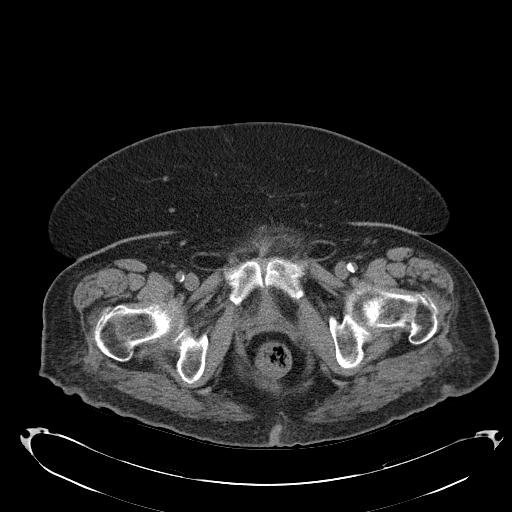
[im 19/92  soft-tissue]
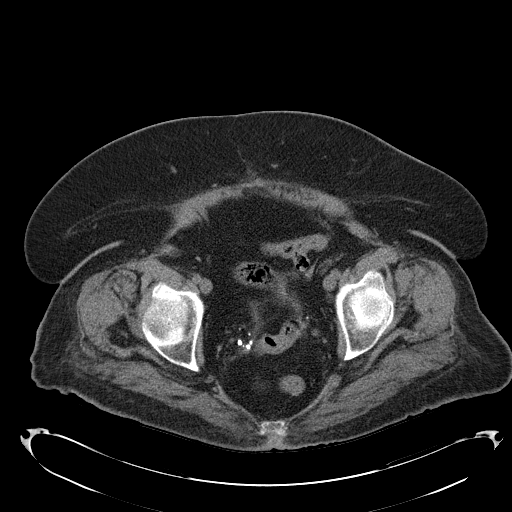
[im 23/92  soft-tissue]
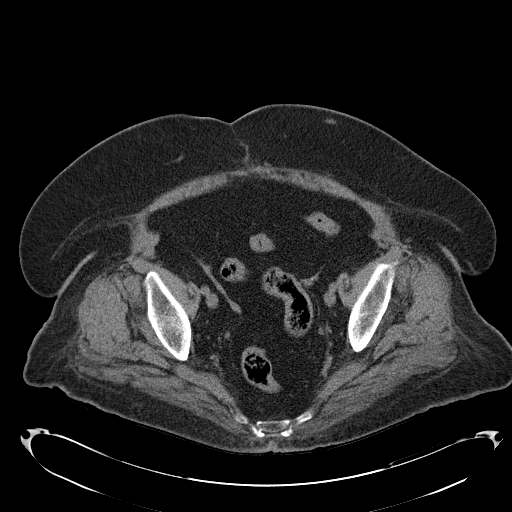
[im 31/92  soft-tissue]
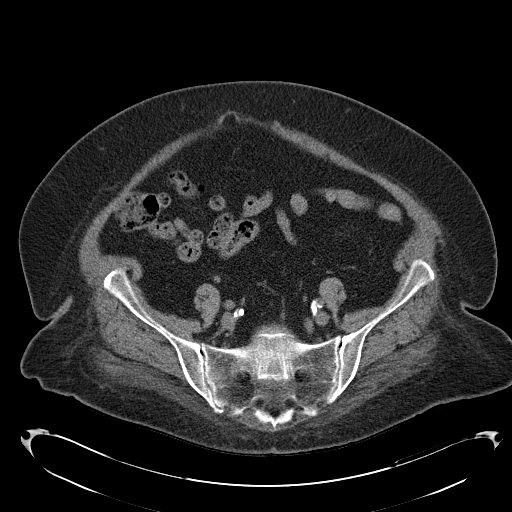
[im 38/92  soft-tissue]
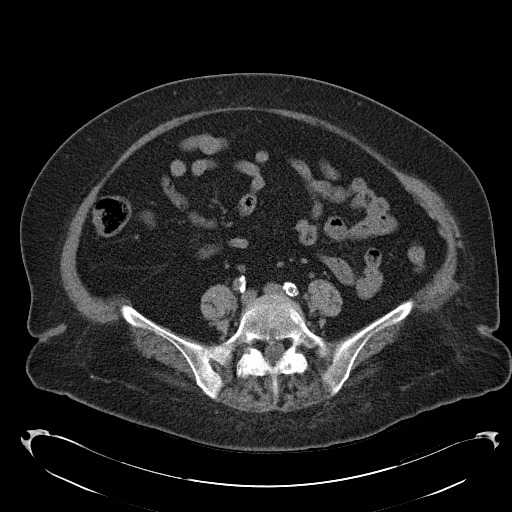
[im 42/92  soft-tissue]
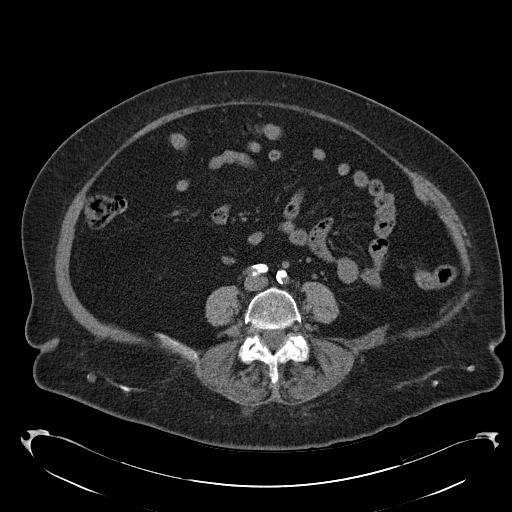
[im 50/92  soft-tissue]
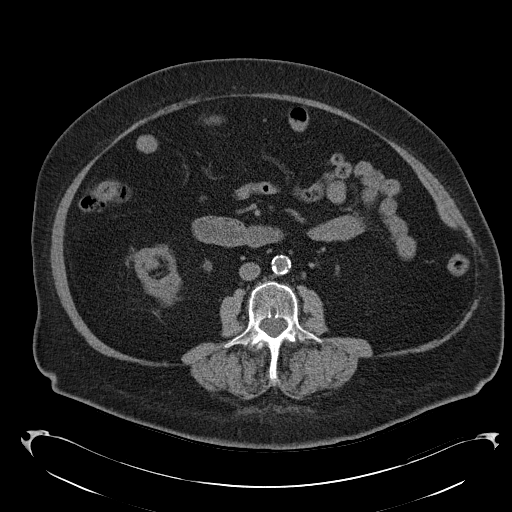
[im 54/92  soft-tissue]
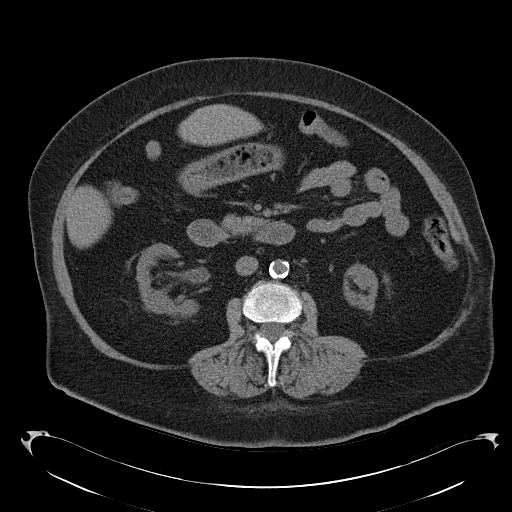
[im 54/92  bone]
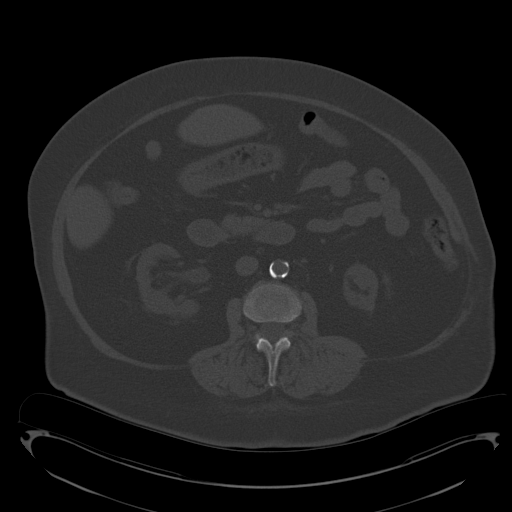
[im 61/92  soft-tissue]
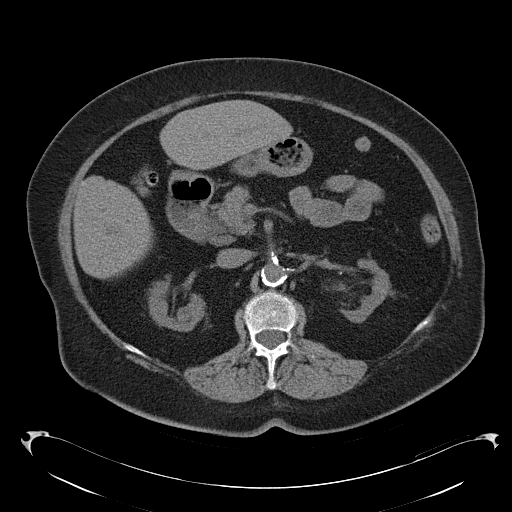
[im 69/92  soft-tissue]
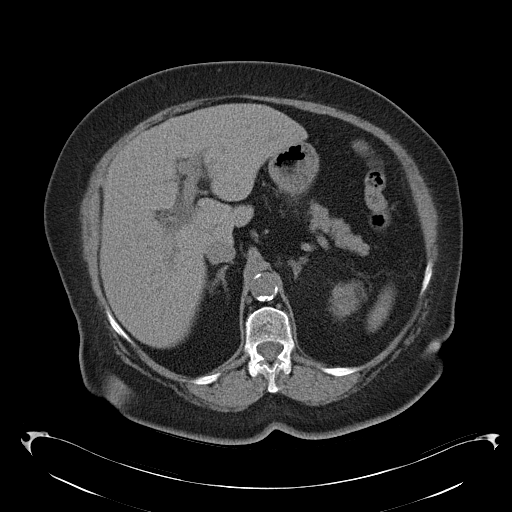
[im 73/92  soft-tissue]
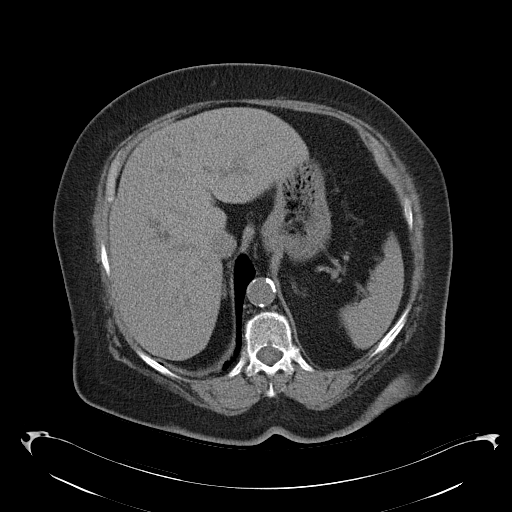
[im 80/92  soft-tissue]
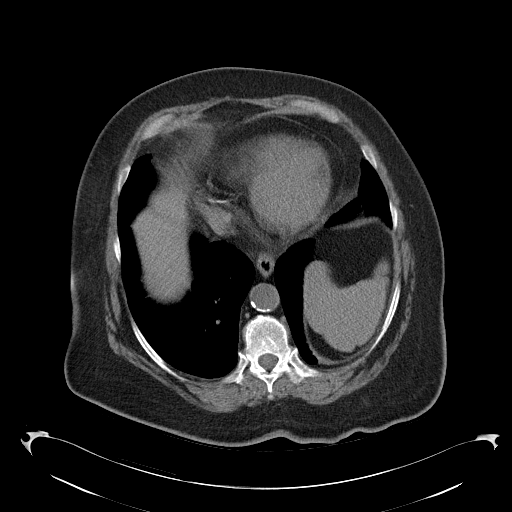
[im 88/92  soft-tissue]
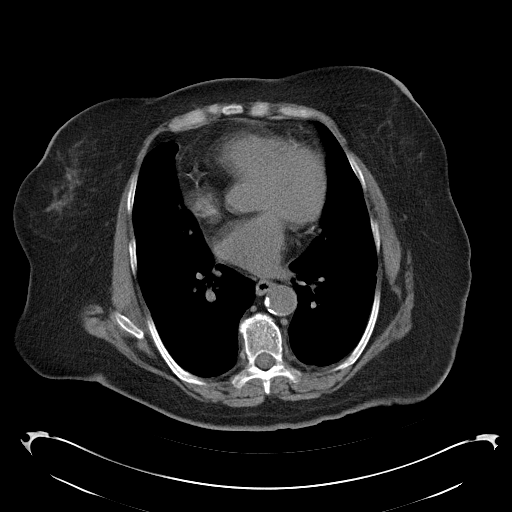

[Series 3: mpr coronal · coronal · 0.83mm/px · 3 of 112 slices shown]
[im 38/112  soft-tissue]
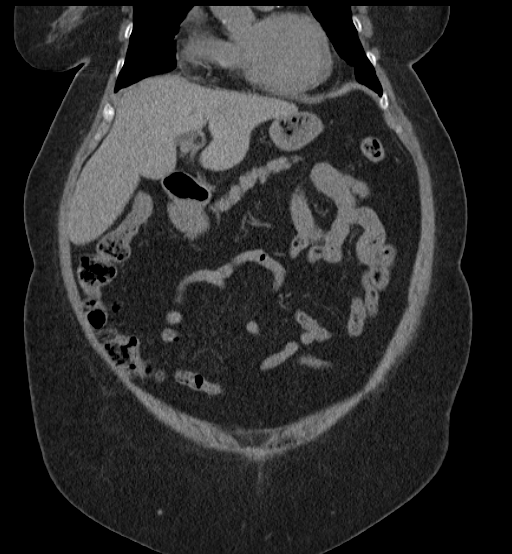
[im 50/112  soft-tissue]
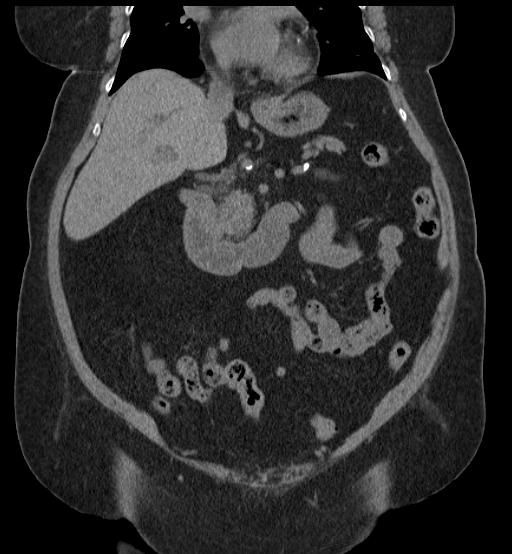
[im 62/112  soft-tissue]
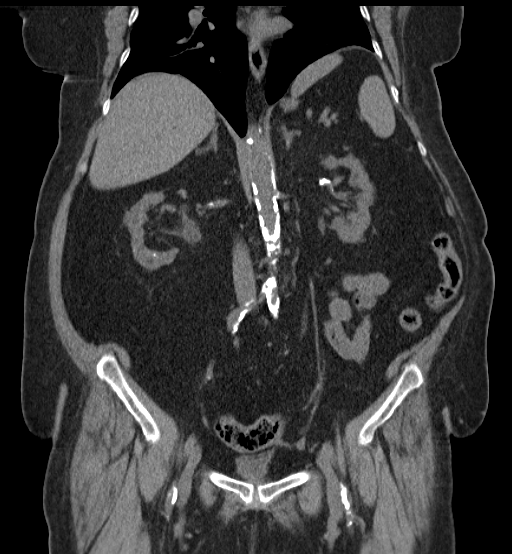

[17 of 46 positions shown; findings below may reference images not displayed]

FINDINGS: Lower chest: No acute findings.

Hepatobiliary:  No mass visualized on this unenhanced exam.

Pancreas: No mass or inflammatory process visualized on this
unenhanced exam.

Spleen:  Within normal limits in size.

Adrenal Glands:  No masses identified.

Kidneys/Urinary tract: Bilateral diffuse renal parenchymal atrophy
appears stable. No evidence hydronephrosis. Bilateral renal vascular
calcification noted as well as other probable tiny 1-2 mm
nonobstructive renal calculi. No evidence of ureteral calculi or
hydronephrosis. No bladder calculi identified.

Stomach/Bowel/Peritoneum: No evidence of dilated bowel loops.
Colonic diverticulosis again demonstrated, without evidence of
diverticulitis or other acute inflammatory process. No abnormal
fluid collections seen.

Vascular/Lymphatic: No pathologically enlarged lymph nodes
identified. No abdominal aortic aneurysm or other significant
retroperitoneal abnormality demonstrated.

Reproductive: Prior hysterectomy noted. Adnexal regions are
unremarkable in appearance.

Other:  None.

Musculoskeletal:  No suspicious bone lesions identified.
IMPRESSION: Stable bilateral renal atrophy and probable tiny nonobstructive
intrarenal calculi. No evidence of ureteral calculi, hydronephrosis,
or other acute findings.

Colonic diverticulosis. No radiographic evidence of diverticulitis.

## 2014-11-03 MED ORDER — FENTANYL CITRATE (PF) 100 MCG/2ML IJ SOLN
50.0000 ug | Freq: Once | INTRAMUSCULAR | Status: AC
  Administered 2014-11-03: 50 ug via INTRAVENOUS
  Filled 2014-11-03: qty 2

## 2014-11-03 MED ORDER — ONDANSETRON HCL 4 MG/2ML IJ SOLN
4.0000 mg | Freq: Once | INTRAMUSCULAR | Status: AC
Start: 2014-11-03 — End: 2014-11-03
  Administered 2014-11-03: 4 mg via INTRAVENOUS
  Filled 2014-11-03: qty 2

## 2014-11-03 MED ORDER — CIPROFLOXACIN IN D5W 400 MG/200ML IV SOLN
400.0000 mg | Freq: Once | INTRAVENOUS | Status: AC
  Administered 2014-11-03: 400 mg via INTRAVENOUS
  Filled 2014-11-03: qty 200

## 2014-11-03 MED ORDER — NITROFURANTOIN MONOHYD MACRO 100 MG PO CAPS: 100.0000 mg | ORAL_CAPSULE | Freq: Two times a day (BID) | ORAL | Status: DC

## 2014-11-03 MED ORDER — SODIUM CHLORIDE 0.9 % IV BOLUS (SEPSIS)
500.0000 mL | Freq: Once | INTRAVENOUS | Status: AC
  Administered 2014-11-03: 500 mL via INTRAVENOUS

## 2014-11-03 MED ORDER — TRAMADOL HCL 50 MG PO TABS: 50.0000 mg | ORAL_TABLET | Freq: Four times a day (QID) | ORAL | Status: DC | PRN

## 2014-11-03 NOTE — ED Notes (Signed)
Pt reports bilat flank pain that started 2 days ago, L worse than R. Pt reports now she is feeling nauseated.

## 2014-11-03 NOTE — Discharge Instructions (Signed)
You have a urinary infection. Increase fluids. Medication for pain and antibiotic. Follow-up your primary care doctor

## 2014-11-03 NOTE — ED Notes (Signed)
Pt provided with discharge instructions - verbalized understanding - Wheeled off unit by son .

## 2014-11-03 NOTE — ED Provider Notes (Signed)
CSN: [id number]     Arrival date & time 11/03/14  1829 History   First MD Initiated Contact with Patient 11/03/14 1846     Chief Complaint  Patient presents with  . Flank Pain     (Consider location/radiation/quality/duration/timing/severity/associated sxs/prior Treatment) HPI..... Bilateral flank pain left greater than right for 2 days with associated nausea. No dysuria, hematuria, fever, sweats, chills. Severity is moderate. Nothing makes symptoms better or worse. Past medical history includes kidney stones.  Past Medical History  Diagnosis Date  . Coronary atherosclerosis of unspecified type of vessel, native or graft     MI x2  . Hyperlipidemia, mixed   . Unspecified essential hypertension   . PVD (peripheral vascular disease)   . Hemorrhoids   . Cerebrovascular disease     CVA-left sided weakness 2 years ago  . Anxiety disorder   . Cat allergies   . Glaucoma   . Depression   . Kidney stones   . Chronic kidney disease (CKD), stage III (moderate) 08/04/2011  . Polyclonal gammopathy 01/29/2012   Past Surgical History  Procedure Laterality Date  . Polyectomy    . Laparoectomy    . Cholecystectomy    . Abdominal hysterectomy     Family History  Problem Relation Age of Onset  . Kidney disease Mother   . Heart attack Father    History  Substance Use Topics  . Smoking status: Former Research scientist (life sciences)  . Smokeless tobacco: Not on file  . Alcohol Use: No   OB History    Gravida Para Term Preterm AB TAB SAB Ectopic Multiple Living   3 2 2  1  1         Review of Systems  All other systems reviewed and are negative.     Allergies  Contrast media; Hydromorphone hcl; Biaxin; Cephalexin; Penicillins; and Sulfonamide derivatives  Home Medications   Prior to Admission medications   Medication Sig Start Date End Date Taking? Authorizing Provider  acetaminophen (TYLENOL) 500 MG tablet Take 1,000 mg by mouth every 6 (six) hours as needed for mild pain or moderate pain.   Yes  Historical Provider, MD  amLODipine (NORVASC) 10 MG tablet Take 10 mg by mouth daily.     Yes Historical Provider, MD  aspirin 81 MG tablet Take 81 mg by mouth every morning.    Yes Historical Provider, MD  cetirizine (ZYRTEC) 10 MG tablet Take 10 mg by mouth daily.   Yes Historical Provider, MD  cyanocobalamin (,VITAMIN B-12,) 1000 MCG/ML injection Inject 1,000 mcg into the muscle every 30 (thirty) days.   Yes Historical Provider, MD  diazepam (VALIUM) 10 MG tablet Take 10 mg by mouth every 6 (six) hours as needed. Takes 3-4 times per day as needed for being upset. Always takes 1 tablet at bedtime   Yes Historical Provider, MD  docusate sodium (COLACE) 100 MG capsule Take 100 mg by mouth 2 (two) times daily.   Yes Historical Provider, MD  furosemide (LASIX) 20 MG tablet Take 20 mg by mouth as needed. For decreasing your potassium level and for fluid retention. 08/05/11 11/03/14 Yes Rexene Alberts, MD  glipiZIDE (GLUCOTROL XL) 10 MG 24 hr tablet Take 10 mg by mouth 2 (two) times daily.    Yes Historical Provider, MD  insulin glargine (LANTUS) 100 UNIT/ML injection Inject 0-30 Units into the skin at bedtime. Adjusts amount based on her blood sugar levels   Yes Historical Provider, MD  isosorbide mononitrate (IMDUR) 30 MG 24 hr tablet Take  30 mg by mouth daily.    Yes Historical Provider, MD  KRILL OIL PO Take 353 mg by mouth daily.   Yes Historical Provider, MD  losartan (COZAAR) 25 MG tablet Take 100 mg by mouth daily.    Yes Historical Provider, MD  Melatonin 3 MG TABS Take 3 mg by mouth at bedtime.   Yes Historical Provider, MD  metoprolol (TOPROL-XL) 50 MG 24 hr tablet Take 50 mg by mouth daily.     Yes Historical Provider, MD  nitroGLYCERIN (NITROSTAT) 0.4 MG SL tablet Place 0.4 mg under the tongue every 5 (five) minutes x 3 doses as needed. For chest pain   Yes Historical Provider, MD  rosuvastatin (CRESTOR) 20 MG tablet Take 20 mg by mouth at bedtime.    Yes Historical Provider, MD  Vitamin D,  Ergocalciferol, (DRISDOL) 50000 UNITS CAPS Take 50,000 Units by mouth every 30 (thirty) days.   Yes Historical Provider, MD  nitrofurantoin, macrocrystal-monohydrate, (MACROBID) 100 MG capsule Take 1 capsule (100 mg total) by mouth 2 (two) times daily. 11/03/14   Nat Christen, MD  traMADol (ULTRAM) 50 MG tablet Take 1 tablet (50 mg total) by mouth every 6 (six) hours as needed. 11/03/14   Nat Christen, MD   BP 113/59 mmHg  Pulse 70  Temp(Src) 97.8 F (36.6 C) (Oral)  Resp 19  Ht 5' (1.524 m)  Wt 175 lb (79.379 kg)  BMI 34.18 kg/m2  SpO2 91% Physical Exam  Constitutional: She is oriented to person, place, and time. She appears well-developed and well-nourished.  HENT:  Head: Normocephalic and atraumatic.  Eyes: Conjunctivae and EOM are normal. Pupils are equal, round, and reactive to light.  Neck: Normal range of motion. Neck supple.  Cardiovascular: Normal rate and regular rhythm.   Pulmonary/Chest: Effort normal and breath sounds normal.  Abdominal: Soft. Bowel sounds are normal.  Genitourinary:  Minimal flank tenderness left > right  Musculoskeletal: Normal range of motion.  Neurological: She is alert and oriented to person, place, and time.  Skin: Skin is warm and dry.  Psychiatric: She has a normal mood and affect. Her behavior is normal.  Nursing note and vitals reviewed.   ED Course  Procedures (including critical care time) Labs Review Labs Reviewed  CBC WITH DIFFERENTIAL/PLATELET - Abnormal; Notable for the following:    WBC 10.9 (*)    Hemoglobin 11.7 (*)    HCT 35.5 (*)    All other components within normal limits  COMPREHENSIVE METABOLIC PANEL - Abnormal; Notable for the following:    Glucose, Bld 220 (*)    BUN 40 (*)    Creatinine, Ser 2.12 (*)    GFR calc non Af Amer 22 (*)    GFR calc Af Amer 25 (*)    All other components within normal limits  URINALYSIS, ROUTINE W REFLEX MICROSCOPIC (NOT AT [facility] - Abnormal; Notable for the following:    Hgb urine dipstick  SMALL (*)    Protein, ur 30 (*)    Leukocytes, UA MODERATE (*)    All other components within normal limits  URINE MICROSCOPIC-ADD ON - Abnormal; Notable for the following:    Bacteria, UA MANY (*)    All other components within normal limits  URINE CULTURE  LIPASE, BLOOD    Imaging Review Ct Renal Stone Study  11/03/2014   CLINICAL DATA:  Bilateral flank pain, right side greater than left, for several days. Nephrolithiasis. Chronic kidney disease stage 3.  EXAM: CT ABDOMEN AND PELVIS WITHOUT  CONTRAST  TECHNIQUE: Multidetector CT imaging of the abdomen and pelvis was performed following the standard protocol without IV contrast.  COMPARISON:  08/02/2011  FINDINGS: Lower chest: No acute findings.  Hepatobiliary:  No mass visualized on this unenhanced exam.  Pancreas: No mass or inflammatory process visualized on this unenhanced exam.  Spleen:  Within normal limits in size.  Adrenal Glands:  No masses identified.  Kidneys/Urinary tract: Bilateral diffuse renal parenchymal atrophy appears stable. No evidence hydronephrosis. Bilateral renal vascular calcification noted as well as other probable tiny 1-2 mm nonobstructive renal calculi. No evidence of ureteral calculi or hydronephrosis. No bladder calculi identified.  Stomach/Bowel/Peritoneum: No evidence of dilated bowel loops. Colonic diverticulosis again demonstrated, without evidence of diverticulitis or other acute inflammatory process. No abnormal fluid collections seen.  Vascular/Lymphatic: No pathologically enlarged lymph nodes identified. No abdominal aortic aneurysm or other significant retroperitoneal abnormality demonstrated.  Reproductive: Prior hysterectomy noted. Adnexal regions are unremarkable in appearance.  Other:  None.  Musculoskeletal:  No suspicious bone lesions identified.  IMPRESSION: Stable bilateral renal atrophy and probable tiny nonobstructive intrarenal calculi. No evidence of ureteral calculi, hydronephrosis, or other acute  findings.  Colonic diverticulosis. No radiographic evidence of diverticulitis.   Electronically Signed   By: Earle Gell M.D.   On: 11/03/2014 20:51     EKG Interpretation None      MDM   Final diagnoses:  UTI (lower urinary tract infection)   No acute abdomen. CT renal study shows no evidence of calculi, hydronephrosis, other acute findings. Will treat her pain. Urinalysis shows evidence of infection. Patient is allergic to macrolides, penicillin, sulfa drugs, cephalosporins.  Will Rx Cipro for uti    Nat Christen, MD 11/04/14 1630

## 2014-11-06 LAB — URINE CULTURE

## 2015-03-05 ENCOUNTER — Ambulatory Visit (INDEPENDENT_AMBULATORY_CARE_PROVIDER_SITE_OTHER): Payer: PRIVATE HEALTH INSURANCE | Admitting: Gastroenterology

## 2015-03-05 ENCOUNTER — Encounter: Payer: Self-pay | Admitting: Gastroenterology

## 2015-03-05 ENCOUNTER — Other Ambulatory Visit: Payer: Self-pay

## 2015-03-05 VITALS — BP 170/70 | HR 80 | Temp 97.0°F | Ht 61.0 in | Wt 170.6 lb

## 2015-03-05 DIAGNOSIS — Z86010 Personal history of colonic polyps: Secondary | ICD-10-CM

## 2015-03-05 DIAGNOSIS — K5900 Constipation, unspecified: Secondary | ICD-10-CM

## 2015-03-05 MED ORDER — SOD PICOSULFATE-MAG OX-CIT ACD 10-3.5-12 MG-GM-GM PO PACK: 1.0000 | PACK | ORAL | Status: DC

## 2015-03-05 MED ORDER — LINACLOTIDE 145 MCG PO CAPS: 145.0000 ug | ORAL_CAPSULE | Freq: Every day | ORAL | Status: DC

## 2015-03-05 NOTE — Assessment & Plan Note (Signed)
Continue current regimen now but samples of Linzess given to take prior to bowel prep. If she would like to utilize long-term she can let us know we'll call in a prescription.

## 2015-03-05 NOTE — Progress Notes (Signed)
CC'D TO PCP °

## 2015-03-05 NOTE — Progress Notes (Signed)
Primary Care Physician:  Glo Herring., MD  Primary Gastroenterologist:  Garfield Cornea, MD   Chief Complaint  Patient presents with  . set up TCS    HPI:  [name] is a 74 y.o. female here to schedule surveillance colonoscopy.  Last colonoscopy June 2010, pancolonic diverticula, multiple tubular adenomas removed. Family history significant for aunt with colon cancer and her son had colonic polyps resected.  At baseline patient has chronic constipation. She takes 2 stool softeners daily and generally has a bowel movement most days, sometimes every other day. Feels like she has adequate management of her constipation with stool softeners. Denies blood in the stool or melena. No abdominal pain. No heartburn, dysphagia, vomiting, unintentional weight loss.      Current Outpatient Prescriptions  Medication Sig Dispense Refill  . acetaminophen (TYLENOL) 500 MG tablet Take 1,000 mg by mouth every 6 (six) hours as needed for mild pain or moderate pain.    Marland Kitchen amLODipine (NORVASC) 10 MG tablet Take 10 mg by mouth daily.      Marland Kitchen aspirin 81 MG tablet Take 81 mg by mouth every morning.     . cetirizine (ZYRTEC) 10 MG tablet Take 10 mg by mouth daily.    . cyanocobalamin (,VITAMIN B-12,) 1000 MCG/ML injection Inject 1,000 mcg into the muscle every 30 (thirty) days.    . diazepam (VALIUM) 10 MG tablet Take 10 mg by mouth every 6 (six) hours as needed. Takes 3-4 times per day as needed for being upset. Always takes 1 tablet at bedtime    . docusate sodium (COLACE) 100 MG capsule Take 100 mg by mouth 2 (two) times daily.    Marland Kitchen glipiZIDE (GLUCOTROL XL) 10 MG 24 hr tablet Take 10 mg by mouth 2 (two) times daily.     . insulin glargine (LANTUS) 100 UNIT/ML injection Inject 0-30 Units into the skin at bedtime. Adjusts amount based on her blood sugar levels    . isosorbide mononitrate (IMDUR) 30 MG 24 hr tablet Take 30 mg by mouth daily.     Marland Kitchen KRILL OIL PO Take 353 mg by mouth daily.    Marland Kitchen losartan  (COZAAR) 25 MG tablet Take 100 mg by mouth daily.     . Melatonin 3 MG TABS Take 3 mg by mouth at bedtime.    . metoprolol (TOPROL-XL) 50 MG 24 hr tablet Take 50 mg by mouth daily.      . nitroGLYCERIN (NITROSTAT) 0.4 MG SL tablet Place 0.4 mg under the tongue every 5 (five) minutes x 3 doses as needed (last dose 1 week ago 03/05/2015). For chest pain    . rosuvastatin (CRESTOR) 20 MG tablet Take 20 mg by mouth at bedtime.     . traMADol (ULTRAM) 50 MG tablet Take 1 tablet (50 mg total) by mouth every 6 (six) hours as needed. 15 tablet 0  . Vitamin D, Ergocalciferol, (DRISDOL) 50000 UNITS CAPS Take 50,000 Units by mouth every 30 (thirty) days.    . furosemide (LASIX) 20 MG tablet Take 20 mg by mouth as needed. For decreasing your potassium level and for fluid retention.     No current facility-administered medications for this visit.    Allergies as of 03/05/2015 - Review Complete 03/05/2015  Allergen Reaction Noted  . Contrast media [iodinated diagnostic agents] Other (See Comments) 08/02/2011  . Hydromorphone hcl Shortness Of Breath and Rash   . Biaxin [clarithromycin] Nausea Only and Other (See Comments) 08/02/2011  . Cephalexin Other (See Comments)   .  Penicillins Other (See Comments)   . Sulfonamide derivatives Nausea And Vomiting 12/26/2009    Past Medical History  Diagnosis Date  . Coronary atherosclerosis of unspecified type of vessel, native or graft     MI x2  . Hyperlipidemia, mixed   . Unspecified essential hypertension   . PVD (peripheral vascular disease) ([place])   . Hemorrhoids   . Cerebrovascular disease     CVA-left sided weakness 2 years ago  . Anxiety disorder   . Cat allergies   . Glaucoma   . Depression   . Kidney stones   . Chronic kidney disease (CKD), stage III (moderate) 08/04/2011  . Polyclonal gammopathy 01/29/2012    Past Surgical History  Procedure Laterality Date  . Polyectomy    . Laparoectomy    . Cholecystectomy    . Abdominal hysterectomy     . Colonoscopy  09/26/08    [id number] rectum/diminutive rectosigmoid polyp/pan colonic diverticula  . Kidney stone surgery      Family History  Problem Relation Age of Onset  . Kidney disease Mother   . Heart attack Father   . Colon cancer Other     aunt  . Colon polyps Son   . Colon polyps Other     siblings    Social History   Social History  . Marital Status: Married    Spouse Name: N/A  . Number of Children: N/A  . Years of Education: N/A   Occupational History  . Retired    Social History Main Topics  . Smoking status: Former Research scientist (life sciences)  . Smokeless tobacco: Not on file  . Alcohol Use: No  . Drug Use: No  . Sexual Activity: Not on file   Other Topics Concern  . Not on file   Social History Narrative      ROS:  General: Negative for anorexia, weight loss, fever, chills, fatigue, weakness. Eyes: Negative for vision changes.  ENT: Negative for hoarseness, difficulty swallowing , nasal congestion. CV: Negative for chest pain, angina, palpitations, dyspnea on exertion, peripheral edema.  Respiratory: Negative for dyspnea at rest, dyspnea on exertion, cough, sputum, wheezing.  GI: See history of present illness. GU:  Negative for dysuria, hematuria, urinary incontinence, urinary frequency, nocturnal urination.  MS: Negative for joint pain, low back pain.  Derm: Negative for rash or itching.  Neuro: Negative for weakness, abnormal sensation, seizure, frequent headaches, memory loss, confusion. Some weakness s/p cva remote Psych: Negative for anxiety, depression, suicidal ideation, hallucinations.  Endo: Negative for unusual weight change.  Heme: Negative for bruising or bleeding. Allergy: Negative for rash or hives.    Physical Examination:  BP 185/81 mmHg  Pulse 80  Temp(Src) 97 F (36.1 C)  Ht 5\' 1"  (1.549 m)  Wt 170 lb 9.6 oz (77.384 kg)  BMI 32.25 kg/m2   General: Well-nourished, well-developed elderly WF in no acute distress.  Head: Normocephalic,  atraumatic.   Eyes: Conjunctiva pink, no icterus. Mouth: Oropharyngeal mucosa moist and pink , no lesions erythema or exudate. Neck: Supple without thyromegaly, masses, or lymphadenopathy.  Lungs: Clear to auscultation bilaterally.  Heart: Regular rate and rhythm, no murmurs rubs or gallops.  Abdomen: Bowel sounds are normal, nontender, nondistended, no hepatosplenomegaly or masses, no abdominal bruits or    hernia , no rebound or guarding.   Rectal: deferred Extremities: No lower extremity edema. No clubbing or deformities.  Neuro: Alert and oriented x 4 , grossly normal neurologically.  Skin: Warm and dry, no rash or jaundice.  Psych: Alert and cooperative, normal mood and affect.  Labs: Lab Results  Component Value Date   WBC 10.9* 11/03/2014   HGB 11.7* 11/03/2014   HCT 35.5* 11/03/2014   MCV 88.5 11/03/2014   PLT 236 11/03/2014   Lab Results  Component Value Date   CREATININE 2.12* 11/03/2014   BUN 40* 11/03/2014   NA 135 11/03/2014   K 4.8 11/03/2014   CL 103 11/03/2014   CO2 24 11/03/2014   Lab Results  Component Value Date   ALT 15 11/03/2014   AST 17 11/03/2014   ALKPHOS 86 11/03/2014   BILITOT 1.0 11/03/2014     Imaging Studies: No results found.

## 2015-03-05 NOTE — Patient Instructions (Signed)
1. Start Linzess five days before your procedure. Take once daily on empty stomach. May cause diarrhea the first couple of doses. 2. Colonoscopy as scheduled. See separate instructions.

## 2015-03-05 NOTE — Assessment & Plan Note (Signed)
74 year old lady with multiple siblings, son with history of colon polyps, aunt with history of colon cancer who presents for surveillance colonoscopy for personal history of adenomatous colon polyps. Patient adamant about not being able to take large volume preps. Last time she took MiraLAX prep was able to tolerate. Given her creatinine clearance, she is not a candidate for Prepopik or Suprep. Augment conscious sedation with Phenergan 12.5 mg IV 30 minutes before the procedure.  I have discussed the risks, alternatives, benefits with regards to but not limited to the risk of reaction to medication, bleeding, infection, perforation and the patient is agreeable to proceed. Written consent to be obtained.

## 2015-03-14 ENCOUNTER — Encounter (HOSPITAL_COMMUNITY)
Admission: RE | Disposition: A | Discharge status: Home / Self Care | Payer: Self-pay | Source: Ambulatory Visit | Attending: Internal Medicine

## 2015-03-14 ENCOUNTER — Encounter (HOSPITAL_COMMUNITY): Payer: Self-pay | Admitting: *Deleted

## 2015-03-14 ENCOUNTER — Ambulatory Visit (HOSPITAL_COMMUNITY)
Admission: RE | Admit: 2015-03-14 | Discharge: 2015-03-14 | Disposition: A | Discharge status: Home / Self Care | Payer: PRIVATE HEALTH INSURANCE | Source: Ambulatory Visit | Attending: Internal Medicine | Admitting: Internal Medicine

## 2015-03-14 DIAGNOSIS — D890 Polyclonal hypergammaglobulinemia: Secondary | ICD-10-CM | POA: Insufficient documentation

## 2015-03-14 DIAGNOSIS — Z880 Allergy status to penicillin: Secondary | ICD-10-CM | POA: Diagnosis not present

## 2015-03-14 DIAGNOSIS — K5730 Diverticulosis of large intestine without perforation or abscess without bleeding: Secondary | ICD-10-CM | POA: Diagnosis not present

## 2015-03-14 DIAGNOSIS — K6389 Other specified diseases of intestine: Secondary | ICD-10-CM | POA: Insufficient documentation

## 2015-03-14 DIAGNOSIS — Z9049 Acquired absence of other specified parts of digestive tract: Secondary | ICD-10-CM | POA: Insufficient documentation

## 2015-03-14 DIAGNOSIS — Z87442 Personal history of urinary calculi: Secondary | ICD-10-CM | POA: Diagnosis not present

## 2015-03-14 DIAGNOSIS — Z794 Long term (current) use of insulin: Secondary | ICD-10-CM | POA: Insufficient documentation

## 2015-03-14 DIAGNOSIS — Z885 Allergy status to narcotic agent status: Secondary | ICD-10-CM | POA: Diagnosis not present

## 2015-03-14 DIAGNOSIS — Z87891 Personal history of nicotine dependence: Secondary | ICD-10-CM | POA: Insufficient documentation

## 2015-03-14 DIAGNOSIS — I129 Hypertensive chronic kidney disease with stage 1 through stage 4 chronic kidney disease, or unspecified chronic kidney disease: Secondary | ICD-10-CM | POA: Insufficient documentation

## 2015-03-14 DIAGNOSIS — F329 Major depressive disorder, single episode, unspecified: Secondary | ICD-10-CM | POA: Insufficient documentation

## 2015-03-14 DIAGNOSIS — I252 Old myocardial infarction: Secondary | ICD-10-CM | POA: Insufficient documentation

## 2015-03-14 DIAGNOSIS — Z882 Allergy status to sulfonamides status: Secondary | ICD-10-CM | POA: Diagnosis not present

## 2015-03-14 DIAGNOSIS — D124 Benign neoplasm of descending colon: Secondary | ICD-10-CM | POA: Insufficient documentation

## 2015-03-14 DIAGNOSIS — I2510 Atherosclerotic heart disease of native coronary artery without angina pectoris: Secondary | ICD-10-CM | POA: Diagnosis not present

## 2015-03-14 DIAGNOSIS — I739 Peripheral vascular disease, unspecified: Secondary | ICD-10-CM | POA: Insufficient documentation

## 2015-03-14 DIAGNOSIS — H409 Unspecified glaucoma: Secondary | ICD-10-CM | POA: Insufficient documentation

## 2015-03-14 DIAGNOSIS — Z800 Family history of malignant neoplasm of digestive organs: Secondary | ICD-10-CM | POA: Diagnosis not present

## 2015-03-14 DIAGNOSIS — D122 Benign neoplasm of ascending colon: Secondary | ICD-10-CM | POA: Diagnosis not present

## 2015-03-14 DIAGNOSIS — Z7982 Long term (current) use of aspirin: Secondary | ICD-10-CM | POA: Insufficient documentation

## 2015-03-14 DIAGNOSIS — Z8673 Personal history of transient ischemic attack (TIA), and cerebral infarction without residual deficits: Secondary | ICD-10-CM | POA: Diagnosis not present

## 2015-03-14 DIAGNOSIS — N183 Chronic kidney disease, stage 3 (moderate): Secondary | ICD-10-CM | POA: Diagnosis not present

## 2015-03-14 DIAGNOSIS — Z881 Allergy status to other antibiotic agents status: Secondary | ICD-10-CM | POA: Diagnosis not present

## 2015-03-14 DIAGNOSIS — Z1211 Encounter for screening for malignant neoplasm of colon: Secondary | ICD-10-CM | POA: Insufficient documentation

## 2015-03-14 DIAGNOSIS — Z90710 Acquired absence of both cervix and uterus: Secondary | ICD-10-CM | POA: Diagnosis not present

## 2015-03-14 DIAGNOSIS — Z86010 Personal history of colonic polyps: Secondary | ICD-10-CM

## 2015-03-14 DIAGNOSIS — F419 Anxiety disorder, unspecified: Secondary | ICD-10-CM | POA: Insufficient documentation

## 2015-03-14 DIAGNOSIS — Z91041 Radiographic dye allergy status: Secondary | ICD-10-CM | POA: Diagnosis not present

## 2015-03-14 DIAGNOSIS — E785 Hyperlipidemia, unspecified: Secondary | ICD-10-CM | POA: Diagnosis not present

## 2015-03-14 DIAGNOSIS — Z79899 Other long term (current) drug therapy: Secondary | ICD-10-CM | POA: Diagnosis not present

## 2015-03-14 HISTORY — DX: Type 2 diabetes mellitus without complications: E11.9

## 2015-03-14 HISTORY — PX: COLONOSCOPY: SHX5424

## 2015-03-14 LAB — GLUCOSE, CAPILLARY: Glucose-Capillary: 109 mg/dL — ABNORMAL HIGH (ref 65–99)

## 2015-03-14 SURGERY — COLONOSCOPY
Anesthesia: Moderate Sedation

## 2015-03-14 MED ORDER — PROMETHAZINE HCL 25 MG/ML IJ SOLN
INTRAMUSCULAR | Status: AC
  Administered 2015-03-14: 12.5 mg
  Filled 2015-03-14: qty 1

## 2015-03-14 MED ORDER — SODIUM CHLORIDE 0.9 % IJ SOLN
INTRAMUSCULAR | Status: AC
  Administered 2015-03-14: 5 mL via INTRAVENOUS
  Filled 2015-03-14: qty 3

## 2015-03-14 MED ORDER — MIDAZOLAM HCL 5 MG/5ML IJ SOLN
INTRAMUSCULAR | Status: AC
  Filled 2015-03-14: qty 10

## 2015-03-14 MED ORDER — ONDANSETRON HCL 4 MG/2ML IJ SOLN
INTRAMUSCULAR | Status: DC | PRN
  Administered 2015-03-14: 4 mg via INTRAVENOUS

## 2015-03-14 MED ORDER — MIDAZOLAM HCL 5 MG/5ML IJ SOLN
INTRAMUSCULAR | Status: DC | PRN
  Administered 2015-03-14 (×2): 1 mg via INTRAVENOUS
  Administered 2015-03-14 (×2): 2 mg via INTRAVENOUS

## 2015-03-14 MED ORDER — SIMETHICONE 40 MG/0.6ML PO SUSP
ORAL | Status: DC | PRN
  Administered 2015-03-14: 11:00:00

## 2015-03-14 MED ORDER — MEPERIDINE HCL 100 MG/ML IJ SOLN
INTRAMUSCULAR | Status: DC | PRN
  Administered 2015-03-14: 25 mg via INTRAVENOUS
  Administered 2015-03-14 (×2): 50 mg via INTRAVENOUS

## 2015-03-14 MED ORDER — MEPERIDINE HCL 100 MG/ML IJ SOLN
INTRAMUSCULAR | Status: AC
  Filled 2015-03-14: qty 2

## 2015-03-14 MED ORDER — ONDANSETRON HCL 4 MG/2ML IJ SOLN
INTRAMUSCULAR | Status: AC
  Filled 2015-03-14: qty 2

## 2015-03-14 MED ORDER — SODIUM CHLORIDE 0.9 % IV SOLN
INTRAVENOUS | Status: DC
  Administered 2015-03-14: 1000 mL via INTRAVENOUS

## 2015-03-14 MED ORDER — PROMETHAZINE HCL 25 MG/ML IJ SOLN: 12.5000 mg | Freq: Once | INTRAMUSCULAR | Status: DC

## 2015-03-14 NOTE — Op Note (Signed)
[facility] [address] [place], [address]   COLONOSCOPY PROCEDURE REPORT  PATIENT: [name], [name]  MR#: [id number] BIRTHDATE: March 06, 1941 , 62  yrs. old GENDER: female ENDOSCOPIST: R.  Garfield Cornea, MD FACP [facility] REFERRED [id number] Gerarda Fraction, M.D. PROCEDURE DATE:  03/31/15 PROCEDURE:   Colonoscopy with snare polypectomy INDICATIONS:History of multiple colonic adenomas; positive family history of colon cancer. MEDICATIONS: Versed 6 mg IV and Demerol 125 mg IV in divided doses. Phenergan 12.5 mg IV.  Zofran 4 mg IV. ASA CLASS:       Class II  CONSENT: The risks, benefits, alternatives and imponderables including but not limited to bleeding, perforation as well as the possibility of a missed lesion have been reviewed.  The potential for biopsy, lesion removal, etc. have also been discussed. Questions have been answered.  All parties agreeable.  Please see the history and physical in the medical record for more information.  DESCRIPTION OF PROCEDURE:   After the risks benefits and alternatives of the procedure were thoroughly explained, informed consent was obtained.  The digital rectal exam revealed no abnormalities of the rectum.   The [id number]  endoscope was introduced through the anus and advanced to the cecum, which was identified by both the appendix and ileocecal valve. No adverse events experienced.   The quality of the prep was adequate  The instrument was then slowly withdrawn as the colon was fully examined. Estimated blood loss is zero unless otherwise noted in this procedure report.      COLON FINDINGS: Internal hemorrhoids and anal papilla; otherwise, normal-appearing rectal mucosa.  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  Scattered pancolonic diverticula; the patient had multiple polyps?"8 in the descending segment 7-8 mm in dimensions.  The patient had (2) 5-7 mm polyps in the ascending segment;  otherwise, remainder colonic mucosa.  Normal.  Multiple hot and cold snare polypectomies performed.  Patient has some viscous effluent in the colon which took some time to wash away and suction out to gain adequate preparation. Retroflexion was performed. .  Withdrawal time=10 minutes 0 seconds.  The scope was withdrawn and the procedure completed. COMPLICATIONS: There were no immediate complications. EBL 10 mL ENDOSCOPIC IMPRESSION: Multiple colonic polyps?"removed as described above. Melanosis coli. Pancolonic diverticulosis  RECOMMENDATIONS: Follow-up pathology. Further recommendations to follow.  eSigned:  R. Garfield Cornea, MD Rosalita Chessman [facility] Mar 31, 2015 11:47 AM   cc:  CPT CODES: ICD CODES:  The ICD and CPT codes recommended by this software are interpretations from the data that the clinical staff has captured with the software.  The verification of the translation of this report to the ICD and CPT codes and modifiers is the sole responsibility of the health care institution and practicing physician where this report was generated.  [employer] will not be held responsible for the validity of the ICD and CPT codes included on this report.  AMA assumes no liability for data contained or not contained herein. CPT is a Designer, television/film set of the [employer].  PATIENT NAME:  [name], [name] MR#: [id number]

## 2015-03-14 NOTE — Discharge Instructions (Signed)
°Colonoscopy °Discharge Instructions ° °Read the instructions outlined below and refer to this sheet in the next few weeks. These discharge instructions provide you with general information on caring for yourself after you leave the hospital. Your doctor may also give you specific instructions. While your treatment has been planned according to the most current medical practices available, unavoidable complications occasionally occur. If you have any problems or questions after discharge, call Dr. Rourk at [phone]. °ACTIVITY °· You may resume your regular activity, but move at a slower pace for the next 24 hours.  °· Take frequent rest periods for the next 24 hours.  °· Walking will help get rid of the air and reduce the bloated feeling in your belly (abdomen).  °· No driving for 24 hours (because of the medicine (anesthesia) used during the test).   °· Do not sign any important legal documents or operate any machinery for 24 hours (because of the anesthesia used during the test).  °NUTRITION °· Drink plenty of fluids.  °· You may resume your normal diet as instructed by your doctor.  °· Begin with a light meal and progress to your normal diet. Heavy or fried foods are harder to digest and may make you feel sick to your stomach (nauseated).  °· Avoid alcoholic beverages for 24 hours or as instructed.  °MEDICATIONS °· You may resume your normal medications unless your doctor tells you otherwise.  °WHAT YOU CAN EXPECT TODAY °· Some feelings of bloating in the abdomen.  °· Passage of more gas than usual.  °· Spotting of blood in your stool or on the toilet paper.  °IF YOU HAD POLYPS REMOVED DURING THE COLONOSCOPY: °· No aspirin products for 7 days or as instructed.  °· No alcohol for 7 days or as instructed.  °· Eat a soft diet for the next 24 hours.  °FINDING OUT THE RESULTS OF YOUR TEST °Not all test results are available during your visit. If your test results are not back during the visit, make an appointment  with your caregiver to find out the results. Do not assume everything is normal if you have not heard from your caregiver or the medical facility. It is important for you to follow up on all of your test results.  °SEEK IMMEDIATE MEDICAL ATTENTION IF: °· You have more than a spotting of blood in your stool.  °· Your belly is swollen (abdominal distention).  °· You are nauseated or vomiting.  °· You have a temperature over 101.  °· You have abdominal pain or discomfort that is severe or gets worse throughout the day.  ° ° °Diverticulosis and colon polyp information provided ° °Further recommendations to follow pending review of pathology report ° ° °Diverticulosis °Diverticulosis is the condition that develops when small pouches (diverticula) form in the wall of your colon. Your colon, or large intestine, is where water is absorbed and stool is formed. The pouches form when the inside layer of your colon pushes through weak spots in the outer layers of your colon. °CAUSES  °No one knows exactly what causes diverticulosis. °RISK FACTORS °· Being older than 50. Your risk for this condition increases with age. Diverticulosis is rare in people younger than 40 years. By age 80, almost everyone has it. °· Eating a low-fiber diet. °· Being frequently constipated. °· Being overweight. °· Not getting enough exercise. °· Smoking. °· Taking over-the-counter pain medicines, like aspirin and ibuprofen. °SYMPTOMS  °Most people with diverticulosis do not have symptoms. °DIAGNOSIS  °  Because diverticulosis often has no symptoms, health care providers often discover the condition during an exam for other colon problems. In many cases, a health care provider will diagnose diverticulosis while using a flexible scope to examine the colon (colonoscopy). °TREATMENT  °If you have never developed an infection related to diverticulosis, you may not need treatment. If you have had an infection before, treatment may include: °· Eating more  fruits, vegetables, and grains. °· Taking a fiber supplement. °· Taking a live bacteria supplement (probiotic). °· Taking medicine to relax your colon. °HOME CARE INSTRUCTIONS  °· Drink at least 6-8 glasses of water each day to prevent constipation. °· Try not to strain when you have a bowel movement. °· Keep all follow-up appointments. °If you have had an infection before:  °· Increase the fiber in your diet as directed by your health care provider or dietitian. °· Take a dietary fiber supplement if your health care provider approves. °· Only take medicines as directed by your health care provider. °SEEK MEDICAL CARE IF:  °· You have abdominal pain. °· You have bloating. °· You have cramps. °· You have not gone to the bathroom in 3 days. °SEEK IMMEDIATE MEDICAL CARE IF:  °· Your pain gets worse. °· Your bloating becomes very bad. °· You have a fever or chills, and your symptoms suddenly get worse. °· You begin vomiting. °· You have bowel movements that are bloody or black. °MAKE SURE YOU: °· Understand these instructions. °· Will watch your condition. °· Will get help right away if you are not doing well or get worse. °  °This information is not intended to replace advice given to you by your health care provider. Make sure you discuss any questions you have with your health care provider. °  °Document Released: 01/10/2004 Document Revised: 04/19/2013 Document Reviewed: 03/09/2013 °Elsevier Interactive Patient Education ©2016 Elsevier Inc. °Colon Polyps °Polyps are lumps of extra tissue growing inside the body. Polyps can grow in the large intestine (colon). Most colon polyps are noncancerous (benign). However, some colon polyps can become cancerous over time. Polyps that are larger than a pea may be harmful. To be safe, caregivers remove and test all polyps. °CAUSES  °Polyps form when mutations in the genes cause your cells to grow and divide even though no more tissue is needed. °RISK FACTORS °There are a number  of risk factors that can increase your chances of getting colon polyps. They include: °· Being older than 50 years. °· Family history of colon polyps or colon cancer. °· Long-term colon diseases, such as colitis or Crohn disease. °· Being overweight. °· Smoking. °· Being inactive. °· Drinking too much alcohol. °SYMPTOMS  °Most small polyps do not cause symptoms. If symptoms are present, they may include: °· Blood in the stool. The stool may look dark red or black. °· Constipation or diarrhea that lasts longer than 1 week. °DIAGNOSIS °People often do not know they have polyps until their caregiver finds them during a regular checkup. Your caregiver can use 4 tests to check for polyps: °· Digital rectal exam. The caregiver wears gloves and feels inside the rectum. This test would find polyps only in the rectum. °· Barium enema. The caregiver puts a liquid called barium into your rectum before taking X-rays of your colon. Barium makes your colon look white. Polyps are dark, so they are easy to see in the X-ray pictures. °· Sigmoidoscopy. A thin, flexible tube (sigmoidoscope) is placed into your rectum. The sigmoidoscope has a   light and tiny camera in it. The caregiver uses the sigmoidoscope to look at the last third of your colon. °· Colonoscopy. This test is like sigmoidoscopy, but the caregiver looks at the entire colon. This is the most common method for finding and removing polyps. °TREATMENT  °Any polyps will be removed during a sigmoidoscopy or colonoscopy. The polyps are then tested for cancer. °PREVENTION  °To help lower your risk of getting more colon polyps: °· Eat plenty of fruits and vegetables. Avoid eating fatty foods. °· Do not smoke. °· Avoid drinking alcohol. °· Exercise every day. °· Lose weight if recommended by your caregiver. °· Eat plenty of calcium and folate. Foods that are rich in calcium include milk, cheese, and broccoli. Foods that are rich in folate include chickpeas, kidney beans, and  spinach. °HOME CARE INSTRUCTIONS °Keep all follow-up appointments as directed by your caregiver. You may need periodic exams to check for polyps. °SEEK MEDICAL CARE IF: °You notice bleeding during a bowel movement. °  °This information is not intended to replace advice given to you by your health care provider. Make sure you discuss any questions you have with your health care provider. °  °Document Released: 01/09/2004 Document Revised: 05/05/2014 Document Reviewed: 06/24/2011 °Elsevier Interactive Patient Education ©2016 Elsevier Inc. ° °

## 2015-03-14 NOTE — Interval H&P Note (Signed)
History and Physical Interval Note:  03/14/2015 10:50 AM  Glorie ANESIA HUTHER  has presented today for surgery, with the diagnosis of HISTORY OF POLYPS  The various methods of treatment have been discussed with the patient and family. After consideration of risks, benefits and other options for treatment, the patient has consented to  Procedure(s) with comments: COLONOSCOPY (N/A) - 1030 - moved to 10:15 - office to notify as a surgical intervention .  The patient's history has been reviewed, patient examined, no change in status, stable for surgery.  I have reviewed the patient's chart and labs.  Questions were answered to the patient's satisfaction.     Angelise Petrich  No change. Surveillance colonoscopy per plan.  The risks, benefits, limitations, alternatives and imponderables have been reviewed with the patient. Questions have been answered. All parties are agreeable.

## 2015-03-14 NOTE — H&P (View-Only) (Signed)
Primary Care Physician:  Glo Herring., MD  Primary Gastroenterologist:  Garfield Cornea, MD   Chief Complaint  Patient presents with  . set up TCS    HPI:  [name] is a 74 y.o. female here to schedule surveillance colonoscopy.  Last colonoscopy June 2010, pancolonic diverticula, multiple tubular adenomas removed. Family history significant for aunt with colon cancer and her son had colonic polyps resected.  At baseline patient has chronic constipation. She takes 2 stool softeners daily and generally has a bowel movement most days, sometimes every other day. Feels like she has adequate management of her constipation with stool softeners. Denies blood in the stool or melena. No abdominal pain. No heartburn, dysphagia, vomiting, unintentional weight loss.      Current Outpatient Prescriptions  Medication Sig Dispense Refill  . acetaminophen (TYLENOL) 500 MG tablet Take 1,000 mg by mouth every 6 (six) hours as needed for mild pain or moderate pain.    Marland Kitchen amLODipine (NORVASC) 10 MG tablet Take 10 mg by mouth daily.      Marland Kitchen aspirin 81 MG tablet Take 81 mg by mouth every morning.     . cetirizine (ZYRTEC) 10 MG tablet Take 10 mg by mouth daily.    . cyanocobalamin (,VITAMIN B-12,) 1000 MCG/ML injection Inject 1,000 mcg into the muscle every 30 (thirty) days.    . diazepam (VALIUM) 10 MG tablet Take 10 mg by mouth every 6 (six) hours as needed. Takes 3-4 times per day as needed for being upset. Always takes 1 tablet at bedtime    . docusate sodium (COLACE) 100 MG capsule Take 100 mg by mouth 2 (two) times daily.    Marland Kitchen glipiZIDE (GLUCOTROL XL) 10 MG 24 hr tablet Take 10 mg by mouth 2 (two) times daily.     . insulin glargine (LANTUS) 100 UNIT/ML injection Inject 0-30 Units into the skin at bedtime. Adjusts amount based on her blood sugar levels    . isosorbide mononitrate (IMDUR) 30 MG 24 hr tablet Take 30 mg by mouth daily.     Marland Kitchen KRILL OIL PO Take 353 mg by mouth daily.    Marland Kitchen losartan  (COZAAR) 25 MG tablet Take 100 mg by mouth daily.     . Melatonin 3 MG TABS Take 3 mg by mouth at bedtime.    . metoprolol (TOPROL-XL) 50 MG 24 hr tablet Take 50 mg by mouth daily.      . nitroGLYCERIN (NITROSTAT) 0.4 MG SL tablet Place 0.4 mg under the tongue every 5 (five) minutes x 3 doses as needed (last dose 1 week ago 03/05/2015). For chest pain    . rosuvastatin (CRESTOR) 20 MG tablet Take 20 mg by mouth at bedtime.     . traMADol (ULTRAM) 50 MG tablet Take 1 tablet (50 mg total) by mouth every 6 (six) hours as needed. 15 tablet 0  . Vitamin D, Ergocalciferol, (DRISDOL) 50000 UNITS CAPS Take 50,000 Units by mouth every 30 (thirty) days.    . furosemide (LASIX) 20 MG tablet Take 20 mg by mouth as needed. For decreasing your potassium level and for fluid retention.     No current facility-administered medications for this visit.    Allergies as of 03/05/2015 - Review Complete 03/05/2015  Allergen Reaction Noted  . Contrast media [iodinated diagnostic agents] Other (See Comments) 08/02/2011  . Hydromorphone hcl Shortness Of Breath and Rash   . Biaxin [clarithromycin] Nausea Only and Other (See Comments) 08/02/2011  . Cephalexin Other (See Comments)   .  Penicillins Other (See Comments)   . Sulfonamide derivatives Nausea And Vomiting 12/26/2009    Past Medical History  Diagnosis Date  . Coronary atherosclerosis of unspecified type of vessel, native or graft     MI x2  . Hyperlipidemia, mixed   . Unspecified essential hypertension   . PVD (peripheral vascular disease) ([place])   . Hemorrhoids   . Cerebrovascular disease     CVA-left sided weakness 2 years ago  . Anxiety disorder   . Cat allergies   . Glaucoma   . Depression   . Kidney stones   . Chronic kidney disease (CKD), stage III (moderate) 08/04/2011  . Polyclonal gammopathy 01/29/2012    Past Surgical History  Procedure Laterality Date  . Polyectomy    . Laparoectomy    . Cholecystectomy    . Abdominal hysterectomy     . Colonoscopy  09/26/08    [id number] rectum/diminutive rectosigmoid polyp/pan colonic diverticula  . Kidney stone surgery      Family History  Problem Relation Age of Onset  . Kidney disease Mother   . Heart attack Father   . Colon cancer Other     aunt  . Colon polyps Son   . Colon polyps Other     siblings    Social History   Social History  . Marital Status: Married    Spouse Name: N/A  . Number of Children: N/A  . Years of Education: N/A   Occupational History  . Retired    Social History Main Topics  . Smoking status: Former Research scientist (life sciences)  . Smokeless tobacco: Not on file  . Alcohol Use: No  . Drug Use: No  . Sexual Activity: Not on file   Other Topics Concern  . Not on file   Social History Narrative      ROS:  General: Negative for anorexia, weight loss, fever, chills, fatigue, weakness. Eyes: Negative for vision changes.  ENT: Negative for hoarseness, difficulty swallowing , nasal congestion. CV: Negative for chest pain, angina, palpitations, dyspnea on exertion, peripheral edema.  Respiratory: Negative for dyspnea at rest, dyspnea on exertion, cough, sputum, wheezing.  GI: See history of present illness. GU:  Negative for dysuria, hematuria, urinary incontinence, urinary frequency, nocturnal urination.  MS: Negative for joint pain, low back pain.  Derm: Negative for rash or itching.  Neuro: Negative for weakness, abnormal sensation, seizure, frequent headaches, memory loss, confusion. Some weakness s/p cva remote Psych: Negative for anxiety, depression, suicidal ideation, hallucinations.  Endo: Negative for unusual weight change.  Heme: Negative for bruising or bleeding. Allergy: Negative for rash or hives.    Physical Examination:  BP 185/81 mmHg  Pulse 80  Temp(Src) 97 F (36.1 C)  Ht 5\' 1"  (1.549 m)  Wt 170 lb 9.6 oz (77.384 kg)  BMI 32.25 kg/m2   General: Well-nourished, well-developed elderly WF in no acute distress.  Head: Normocephalic,  atraumatic.   Eyes: Conjunctiva pink, no icterus. Mouth: Oropharyngeal mucosa moist and pink , no lesions erythema or exudate. Neck: Supple without thyromegaly, masses, or lymphadenopathy.  Lungs: Clear to auscultation bilaterally.  Heart: Regular rate and rhythm, no murmurs rubs or gallops.  Abdomen: Bowel sounds are normal, nontender, nondistended, no hepatosplenomegaly or masses, no abdominal bruits or    hernia , no rebound or guarding.   Rectal: deferred Extremities: No lower extremity edema. No clubbing or deformities.  Neuro: Alert and oriented x 4 , grossly normal neurologically.  Skin: Warm and dry, no rash or jaundice.  Psych: Alert and cooperative, normal mood and affect.  Labs: Lab Results  Component Value Date   WBC 10.9* 11/03/2014   HGB 11.7* 11/03/2014   HCT 35.5* 11/03/2014   MCV 88.5 11/03/2014   PLT 236 11/03/2014   Lab Results  Component Value Date   CREATININE 2.12* 11/03/2014   BUN 40* 11/03/2014   NA 135 11/03/2014   K 4.8 11/03/2014   CL 103 11/03/2014   CO2 24 11/03/2014   Lab Results  Component Value Date   ALT 15 11/03/2014   AST 17 11/03/2014   ALKPHOS 86 11/03/2014   BILITOT 1.0 11/03/2014     Imaging Studies: No results found.

## 2015-03-15 ENCOUNTER — Encounter: Payer: Self-pay | Admitting: Internal Medicine

## 2015-03-21 ENCOUNTER — Encounter (HOSPITAL_COMMUNITY): Payer: Self-pay | Admitting: Internal Medicine

## 2015-05-23 DIAGNOSIS — E538 Deficiency of other specified B group vitamins: Secondary | ICD-10-CM | POA: Diagnosis not present

## 2015-06-20 DIAGNOSIS — D510 Vitamin B12 deficiency anemia due to intrinsic factor deficiency: Secondary | ICD-10-CM | POA: Diagnosis not present

## 2015-07-19 DIAGNOSIS — E538 Deficiency of other specified B group vitamins: Secondary | ICD-10-CM | POA: Diagnosis not present

## 2015-08-01 DIAGNOSIS — Z1389 Encounter for screening for other disorder: Secondary | ICD-10-CM | POA: Diagnosis not present

## 2015-08-01 DIAGNOSIS — E1151 Type 2 diabetes mellitus with diabetic peripheral angiopathy without gangrene: Secondary | ICD-10-CM | POA: Diagnosis not present

## 2015-08-01 DIAGNOSIS — I10 Essential (primary) hypertension: Secondary | ICD-10-CM | POA: Diagnosis not present

## 2015-08-01 DIAGNOSIS — Z6832 Body mass index (BMI) 32.0-32.9, adult: Secondary | ICD-10-CM | POA: Diagnosis not present

## 2015-08-01 DIAGNOSIS — N184 Chronic kidney disease, stage 4 (severe): Secondary | ICD-10-CM | POA: Diagnosis not present

## 2015-08-01 DIAGNOSIS — R201 Hypoesthesia of skin: Secondary | ICD-10-CM | POA: Diagnosis not present

## 2015-08-22 DIAGNOSIS — E538 Deficiency of other specified B group vitamins: Secondary | ICD-10-CM | POA: Diagnosis not present

## 2015-09-07 DIAGNOSIS — H524 Presbyopia: Secondary | ICD-10-CM | POA: Diagnosis not present

## 2015-09-07 DIAGNOSIS — H52222 Regular astigmatism, left eye: Secondary | ICD-10-CM | POA: Diagnosis not present

## 2015-09-27 DIAGNOSIS — E538 Deficiency of other specified B group vitamins: Secondary | ICD-10-CM | POA: Diagnosis not present

## 2015-10-12 ENCOUNTER — Other Ambulatory Visit: Payer: Self-pay | Admitting: Cardiology

## 2015-10-12 DIAGNOSIS — I6523 Occlusion and stenosis of bilateral carotid arteries: Secondary | ICD-10-CM

## 2015-10-31 NOTE — Progress Notes (Signed)
HPI:  FU CAD; last catheterization in September 2008. At that time, she was found to have significant left circumflex disease as well as marginal disease to correlate with perfusion abnormality on her Myoview. However, it was felt that these are small vessels and would be difficult to intervene on either percutaneously or from a surgical standpoint. Her only other disease was in the PDA at 50-70%. Last carotid Dopplers in May 2016 showed mild to moderate bilateral plaque without significant stenosis. FU recommended in one year. Nuclear study May 2016 showed ejection fraction 76% and inferior lateral ischemia consistent with known circumflex disease. Since I last saw her, She has dyspnea with activities but no orthopnea, PND or pedal edema. She occasionally has chest heaviness with stress unchanged. No syncope  Current Outpatient Prescriptions  Medication Sig Dispense Refill  . acetaminophen (TYLENOL) 500 MG tablet Take 1,000 mg by mouth every 6 (six) hours as needed for mild pain or moderate pain.    Marland Kitchen amLODipine (NORVASC) 10 MG tablet Take 10 mg by mouth daily.      Marland Kitchen aspirin 81 MG tablet Take 81 mg by mouth every morning.     . cetirizine (ZYRTEC) 10 MG tablet Take 10 mg by mouth daily as needed for allergies.     . Cholecalciferol (VITAMIN D-3 PO) Take 2 tablets by mouth daily.    . cyanocobalamin (,VITAMIN B-12,) 1000 MCG/ML injection Inject 1,000 mcg into the muscle every 30 (thirty) days.    . diazepam (VALIUM) 10 MG tablet Take 10 mg by mouth every 6 (six) hours as needed. Takes 3-4 times per day as needed for being upset. Always takes 1 tablet at bedtime    . docusate sodium (COLACE) 100 MG capsule Take 100 mg by mouth 2 (two) times daily.    Marland Kitchen glipiZIDE (GLUCOTROL XL) 10 MG 24 hr tablet Take 10 mg by mouth 2 (two) times daily.     . insulin glargine (LANTUS) 100 UNIT/ML injection Inject 40 Units into the skin at bedtime. Adjusts amount based on her blood sugar levels    . isosorbide  mononitrate (IMDUR) 30 MG 24 hr tablet Take 30 mg by mouth daily.     Marland Kitchen KRILL OIL PO Take 353 mg by mouth daily.    . Linaclotide (LINZESS) 145 MCG CAPS capsule Take 1 capsule (145 mcg total) by mouth daily. (Patient taking differently: Take 145 mcg by mouth as needed. ) 5 capsule 0  . losartan (COZAAR) 100 MG tablet Take 100 mg by mouth daily.    . Melatonin 3 MG TABS Take 3 mg by mouth at bedtime as needed (sleep).     . metoprolol (TOPROL-XL) 50 MG 24 hr tablet Take 50 mg by mouth daily.      . nitroGLYCERIN (NITROSTAT) 0.4 MG SL tablet Place 0.4 mg under the tongue every 5 (five) minutes x 3 doses as needed (last dose 1 week ago 03/05/2015). For chest pain    . rosuvastatin (CRESTOR) 20 MG tablet Take 20 mg by mouth at bedtime.     . furosemide (LASIX) 20 MG tablet Take 20 mg by mouth as needed. For decreasing your potassium level and for fluid retention.     No current facility-administered medications for this visit.     Past Medical History  Diagnosis Date  . Coronary atherosclerosis of unspecified type of vessel, native or graft     MI x2  . Hyperlipidemia, mixed   . Unspecified essential hypertension   .  PVD (peripheral vascular disease) ([place])   . Hemorrhoids   . Cerebrovascular disease     CVA-left sided weakness  . Anxiety disorder   . Cat allergies   . Glaucoma   . Depression   . Kidney stones   . Chronic kidney disease (CKD), stage III (moderate) 08/04/2011  . Polyclonal gammopathy 01/29/2012  . Diabetes mellitus without complication [facility])     Past Surgical History  Procedure Laterality Date  . Polyectomy    . Laparoectomy    . Cholecystectomy    . Abdominal hysterectomy    . Colonoscopy  09/26/08    [id number] rectum/diminutive rectosigmoid polyp/pan colonic diverticula  . Kidney stone surgery    . Tubal ligation    . Colonoscopy N/A 03/14/2015    Procedure: COLONOSCOPY;  Surgeon: Daneil Dolin, MD;  Location: [facility];  Service: Endoscopy;  Laterality:  N/A;  1030 - moved to 10:15 - office to notify    Social History   Social History  . Marital Status: Married    Spouse Name: N/A  . Number of Children: N/A  . Years of Education: N/A   Occupational History  . Retired    Social History Main Topics  . Smoking status: Former Research scientist (life sciences)  . Smokeless tobacco: Not on file  . Alcohol Use: No  . Drug Use: No  . Sexual Activity: Not on file   Other Topics Concern  . Not on file   Social History Narrative    Family History  Problem Relation Age of Onset  . Kidney disease Mother   . Heart attack Father   . Colon cancer Other     aunt  . Colon polyps Son   . Colon polyps Other     siblings    ROS: no fevers or chills, productive cough, hemoptysis, dysphasia, odynophagia, melena, hematochezia, dysuria, hematuria, rash, seizure activity, orthopnea, PND, pedal edema, claudication. Remaining systems are negative.  Physical Exam: Well-developed well-nourished in no acute distress.  Skin is warm and dry.  HEENT is normal.  Neck is supple.  Chest is clear to auscultation with normal expansion.  Cardiovascular exam is regular rate and rhythm.  Abdominal exam nontender or distended. No masses palpated. Extremities show no edema. neuro grossly intact  ECG Sinus rhythm at a rate of 61. Nonspecific ST changes.  A/P  1 Continue aspirin and statin. Last nuclear study showed ischemia that correlated with previous coronary disease. Plan medical therapy. 2 carotid artery disease-continue aspirin and statin. Follow-up carotid Dopplers. 3 hyperlipidemia-increase Crestor to 40 mg daily. Check lipids and liver in 4 weeks. 4 hypertension-blood pressure controlled. Continue present medications.  Kirk Ruths, MD

## 2015-11-05 DIAGNOSIS — J0100 Acute maxillary sinusitis, unspecified: Secondary | ICD-10-CM | POA: Diagnosis not present

## 2015-11-05 DIAGNOSIS — E6609 Other obesity due to excess calories: Secondary | ICD-10-CM | POA: Diagnosis not present

## 2015-11-05 DIAGNOSIS — Z1389 Encounter for screening for other disorder: Secondary | ICD-10-CM | POA: Diagnosis not present

## 2015-11-05 DIAGNOSIS — Z6832 Body mass index (BMI) 32.0-32.9, adult: Secondary | ICD-10-CM | POA: Diagnosis not present

## 2015-11-05 DIAGNOSIS — E538 Deficiency of other specified B group vitamins: Secondary | ICD-10-CM | POA: Diagnosis not present

## 2015-11-06 ENCOUNTER — Ambulatory Visit (HOSPITAL_COMMUNITY)
Admission: RE | Admit: 2015-11-06 | Discharge: 2015-11-06 | Disposition: A | Discharge status: Home / Self Care | Payer: PPO | Source: Ambulatory Visit | Attending: Cardiology | Admitting: Cardiology

## 2015-11-06 ENCOUNTER — Ambulatory Visit (INDEPENDENT_AMBULATORY_CARE_PROVIDER_SITE_OTHER): Payer: PPO | Admitting: Cardiology

## 2015-11-06 ENCOUNTER — Encounter: Payer: Self-pay | Admitting: Cardiology

## 2015-11-06 VITALS — BP 125/63 | HR 61 | Ht 61.0 in | Wt 170.0 lb

## 2015-11-06 DIAGNOSIS — E1122 Type 2 diabetes mellitus with diabetic chronic kidney disease: Secondary | ICD-10-CM | POA: Diagnosis not present

## 2015-11-06 DIAGNOSIS — I129 Hypertensive chronic kidney disease with stage 1 through stage 4 chronic kidney disease, or unspecified chronic kidney disease: Secondary | ICD-10-CM | POA: Diagnosis not present

## 2015-11-06 DIAGNOSIS — I2510 Atherosclerotic heart disease of native coronary artery without angina pectoris: Secondary | ICD-10-CM | POA: Diagnosis not present

## 2015-11-06 DIAGNOSIS — I6523 Occlusion and stenosis of bilateral carotid arteries: Secondary | ICD-10-CM | POA: Diagnosis not present

## 2015-11-06 DIAGNOSIS — E785 Hyperlipidemia, unspecified: Secondary | ICD-10-CM

## 2015-11-06 DIAGNOSIS — I10 Essential (primary) hypertension: Secondary | ICD-10-CM

## 2015-11-06 DIAGNOSIS — F419 Anxiety disorder, unspecified: Secondary | ICD-10-CM | POA: Diagnosis not present

## 2015-11-06 DIAGNOSIS — N183 Chronic kidney disease, stage 3 (moderate): Secondary | ICD-10-CM | POA: Diagnosis not present

## 2015-11-06 DIAGNOSIS — F329 Major depressive disorder, single episode, unspecified: Secondary | ICD-10-CM | POA: Diagnosis not present

## 2015-11-06 DIAGNOSIS — E782 Mixed hyperlipidemia: Secondary | ICD-10-CM | POA: Diagnosis not present

## 2015-11-06 MED ORDER — ROSUVASTATIN CALCIUM 40 MG PO TABS: 40.0000 mg | ORAL_TABLET | Freq: Every day | ORAL | Status: DC

## 2015-11-06 NOTE — Patient Instructions (Addendum)
Medications  Increase Rosuvastatin to 40 mg daily. Rx was sent in to [facility]; 1 month supply with 11 refills.   Lab work  Return for lab work in 4 weeks at [address] location.   Follow-up in 1 year with Dr. Stanford Breed.   If you need a refill on your cardiac medications before your next appointment, please call your pharmacy.

## 2015-11-09 ENCOUNTER — Telehealth: Payer: Self-pay | Admitting: Cardiology

## 2015-11-09 DIAGNOSIS — I779 Disorder of arteries and arterioles, unspecified: Secondary | ICD-10-CM

## 2015-11-09 DIAGNOSIS — I739 Peripheral vascular disease, unspecified: Principal | ICD-10-CM

## 2015-11-09 NOTE — Telephone Encounter (Signed)
Returning a call about her results . Please call   Thanks

## 2015-11-09 NOTE — Telephone Encounter (Signed)
Patient notified of carotid doppler results.  Repeat study for 1 year ordered.

## 2015-11-23 ENCOUNTER — Telehealth: Payer: Self-pay | Admitting: Cardiology

## 2015-11-23 DIAGNOSIS — Z79899 Other long term (current) drug therapy: Secondary | ICD-10-CM

## 2015-11-23 DIAGNOSIS — E785 Hyperlipidemia, unspecified: Secondary | ICD-10-CM

## 2015-11-23 NOTE — Telephone Encounter (Signed)
Attempted to call [facility] - no answer Called patient. She wants to get labs done in [employer] as this will work better for her schedule.  Labs re-ordered. Previous appt at [facility] office lab cancelled.

## 2015-11-23 NOTE — Telephone Encounter (Signed)
She needs a lab order, please fax to [phone] [id number].

## 2015-12-07 ENCOUNTER — Other Ambulatory Visit: Payer: PPO

## 2015-12-07 DIAGNOSIS — E538 Deficiency of other specified B group vitamins: Secondary | ICD-10-CM | POA: Diagnosis not present

## 2015-12-19 DIAGNOSIS — Z79899 Other long term (current) drug therapy: Secondary | ICD-10-CM | POA: Diagnosis not present

## 2015-12-19 DIAGNOSIS — E785 Hyperlipidemia, unspecified: Secondary | ICD-10-CM | POA: Diagnosis not present

## 2015-12-20 ENCOUNTER — Encounter: Payer: Self-pay | Admitting: *Deleted

## 2015-12-20 LAB — HEPATIC FUNCTION PANEL
ALK PHOS: 76 IU/L (ref 39–117)
ALT: 19 IU/L (ref 0–32)
AST: 17 IU/L (ref 0–40)
Albumin: 4.3 g/dL (ref 3.5–4.8)
BILIRUBIN TOTAL: 0.8 mg/dL (ref 0.0–1.2)
BILIRUBIN, DIRECT: 0.21 mg/dL (ref 0.00–0.40)
Total Protein: 6.5 g/dL (ref 6.0–8.5)

## 2015-12-20 LAB — LIPID PANEL
CHOL/HDL RATIO: 2.4 ratio (ref 0.0–4.4)
Cholesterol, Total: 126 mg/dL (ref 100–199)
HDL: 52 mg/dL (ref >39)
LDL Calculated: 39 mg/dL (ref 0–99)
TRIGLYCERIDES: 173 mg/dL — AB (ref 0–149)
VLDL Cholesterol Cal: 35 mg/dL (ref 5–40)

## 2016-01-10 DIAGNOSIS — D510 Vitamin B12 deficiency anemia due to intrinsic factor deficiency: Secondary | ICD-10-CM | POA: Diagnosis not present

## 2016-02-06 DIAGNOSIS — E538 Deficiency of other specified B group vitamins: Secondary | ICD-10-CM | POA: Diagnosis not present

## 2016-02-11 DIAGNOSIS — Z23 Encounter for immunization: Secondary | ICD-10-CM | POA: Diagnosis not present

## 2016-03-13 DIAGNOSIS — D510 Vitamin B12 deficiency anemia due to intrinsic factor deficiency: Secondary | ICD-10-CM | POA: Diagnosis not present

## 2016-04-08 DIAGNOSIS — D510 Vitamin B12 deficiency anemia due to intrinsic factor deficiency: Secondary | ICD-10-CM | POA: Diagnosis not present

## 2016-04-15 DIAGNOSIS — L570 Actinic keratosis: Secondary | ICD-10-CM | POA: Diagnosis not present

## 2016-04-23 DIAGNOSIS — J029 Acute pharyngitis, unspecified: Secondary | ICD-10-CM | POA: Diagnosis not present

## 2016-04-23 DIAGNOSIS — Z1389 Encounter for screening for other disorder: Secondary | ICD-10-CM | POA: Diagnosis not present

## 2016-04-23 DIAGNOSIS — E6609 Other obesity due to excess calories: Secondary | ICD-10-CM | POA: Diagnosis not present

## 2016-04-23 DIAGNOSIS — Z6832 Body mass index (BMI) 32.0-32.9, adult: Secondary | ICD-10-CM | POA: Diagnosis not present

## 2016-04-23 DIAGNOSIS — J069 Acute upper respiratory infection, unspecified: Secondary | ICD-10-CM | POA: Diagnosis not present

## 2016-05-12 DIAGNOSIS — E538 Deficiency of other specified B group vitamins: Secondary | ICD-10-CM | POA: Diagnosis not present

## 2016-06-12 DIAGNOSIS — D510 Vitamin B12 deficiency anemia due to intrinsic factor deficiency: Secondary | ICD-10-CM | POA: Diagnosis not present

## 2016-07-10 DIAGNOSIS — E538 Deficiency of other specified B group vitamins: Secondary | ICD-10-CM | POA: Diagnosis not present

## 2016-08-19 DIAGNOSIS — E538 Deficiency of other specified B group vitamins: Secondary | ICD-10-CM | POA: Diagnosis not present

## 2016-09-08 DIAGNOSIS — N183 Chronic kidney disease, stage 3 (moderate): Secondary | ICD-10-CM | POA: Diagnosis not present

## 2016-09-08 DIAGNOSIS — E559 Vitamin D deficiency, unspecified: Secondary | ICD-10-CM | POA: Diagnosis not present

## 2016-09-08 DIAGNOSIS — D509 Iron deficiency anemia, unspecified: Secondary | ICD-10-CM | POA: Diagnosis not present

## 2016-09-08 DIAGNOSIS — I10 Essential (primary) hypertension: Secondary | ICD-10-CM | POA: Diagnosis not present

## 2016-09-08 DIAGNOSIS — R809 Proteinuria, unspecified: Secondary | ICD-10-CM | POA: Diagnosis not present

## 2016-09-08 DIAGNOSIS — Z79899 Other long term (current) drug therapy: Secondary | ICD-10-CM | POA: Diagnosis not present

## 2016-09-12 DIAGNOSIS — R809 Proteinuria, unspecified: Secondary | ICD-10-CM | POA: Diagnosis not present

## 2016-09-12 DIAGNOSIS — N184 Chronic kidney disease, stage 4 (severe): Secondary | ICD-10-CM | POA: Diagnosis not present

## 2016-09-12 DIAGNOSIS — D638 Anemia in other chronic diseases classified elsewhere: Secondary | ICD-10-CM | POA: Diagnosis not present

## 2016-09-12 DIAGNOSIS — N2581 Secondary hyperparathyroidism of renal origin: Secondary | ICD-10-CM | POA: Diagnosis not present

## 2016-09-12 DIAGNOSIS — I10 Essential (primary) hypertension: Secondary | ICD-10-CM | POA: Diagnosis not present

## 2016-10-03 DIAGNOSIS — E1140 Type 2 diabetes mellitus with diabetic neuropathy, unspecified: Secondary | ICD-10-CM | POA: Diagnosis not present

## 2016-10-03 DIAGNOSIS — E782 Mixed hyperlipidemia: Secondary | ICD-10-CM | POA: Diagnosis not present

## 2016-10-03 DIAGNOSIS — N184 Chronic kidney disease, stage 4 (severe): Secondary | ICD-10-CM | POA: Diagnosis not present

## 2016-10-03 DIAGNOSIS — E538 Deficiency of other specified B group vitamins: Secondary | ICD-10-CM | POA: Diagnosis not present

## 2016-10-03 DIAGNOSIS — R201 Hypoesthesia of skin: Secondary | ICD-10-CM | POA: Diagnosis not present

## 2016-10-03 DIAGNOSIS — I10 Essential (primary) hypertension: Secondary | ICD-10-CM | POA: Diagnosis not present

## 2016-10-03 DIAGNOSIS — E1151 Type 2 diabetes mellitus with diabetic peripheral angiopathy without gangrene: Secondary | ICD-10-CM | POA: Diagnosis not present

## 2016-10-03 DIAGNOSIS — Z6831 Body mass index (BMI) 31.0-31.9, adult: Secondary | ICD-10-CM | POA: Diagnosis not present

## 2016-10-07 DIAGNOSIS — E1129 Type 2 diabetes mellitus with other diabetic kidney complication: Secondary | ICD-10-CM | POA: Diagnosis not present

## 2016-10-13 ENCOUNTER — Other Ambulatory Visit (HOSPITAL_COMMUNITY): Payer: Self-pay | Admitting: Internal Medicine

## 2016-10-13 ENCOUNTER — Ambulatory Visit (HOSPITAL_COMMUNITY)
Admission: RE | Admit: 2016-10-13 | Discharge: 2016-10-13 | Disposition: A | Discharge status: Home / Self Care | Payer: PPO | Source: Ambulatory Visit | Attending: Internal Medicine | Admitting: Internal Medicine

## 2016-10-13 DIAGNOSIS — E1129 Type 2 diabetes mellitus with other diabetic kidney complication: Secondary | ICD-10-CM | POA: Diagnosis not present

## 2016-10-13 DIAGNOSIS — R067 Sneezing: Secondary | ICD-10-CM | POA: Diagnosis not present

## 2016-10-13 DIAGNOSIS — I700 Atherosclerosis of aorta: Secondary | ICD-10-CM | POA: Insufficient documentation

## 2016-10-13 DIAGNOSIS — Z6831 Body mass index (BMI) 31.0-31.9, adult: Secondary | ICD-10-CM | POA: Diagnosis not present

## 2016-10-13 DIAGNOSIS — N184 Chronic kidney disease, stage 4 (severe): Secondary | ICD-10-CM | POA: Diagnosis not present

## 2016-10-13 DIAGNOSIS — T148XXA Other injury of unspecified body region, initial encounter: Secondary | ICD-10-CM

## 2016-10-13 DIAGNOSIS — R918 Other nonspecific abnormal finding of lung field: Secondary | ICD-10-CM | POA: Insufficient documentation

## 2016-10-13 DIAGNOSIS — R071 Chest pain on breathing: Secondary | ICD-10-CM

## 2016-10-13 DIAGNOSIS — R079 Chest pain, unspecified: Secondary | ICD-10-CM | POA: Insufficient documentation

## 2016-10-13 DIAGNOSIS — Z1379 Encounter for other screening for genetic and chromosomal anomalies: Secondary | ICD-10-CM | POA: Diagnosis not present

## 2016-10-13 DIAGNOSIS — Z1389 Encounter for screening for other disorder: Secondary | ICD-10-CM | POA: Diagnosis not present

## 2016-10-13 DIAGNOSIS — S2232XA Fracture of one rib, left side, initial encounter for closed fracture: Secondary | ICD-10-CM | POA: Diagnosis not present

## 2016-10-13 IMAGING — DX DG CHEST 2V
2 series · 2 of 2 positions shown · non-contrast
Comparison: Radiograph [DATE].

CLINICAL DATA: Right-sided chest pain after fall last week.

EXAM:
CHEST  2 VIEW

[chest pa]
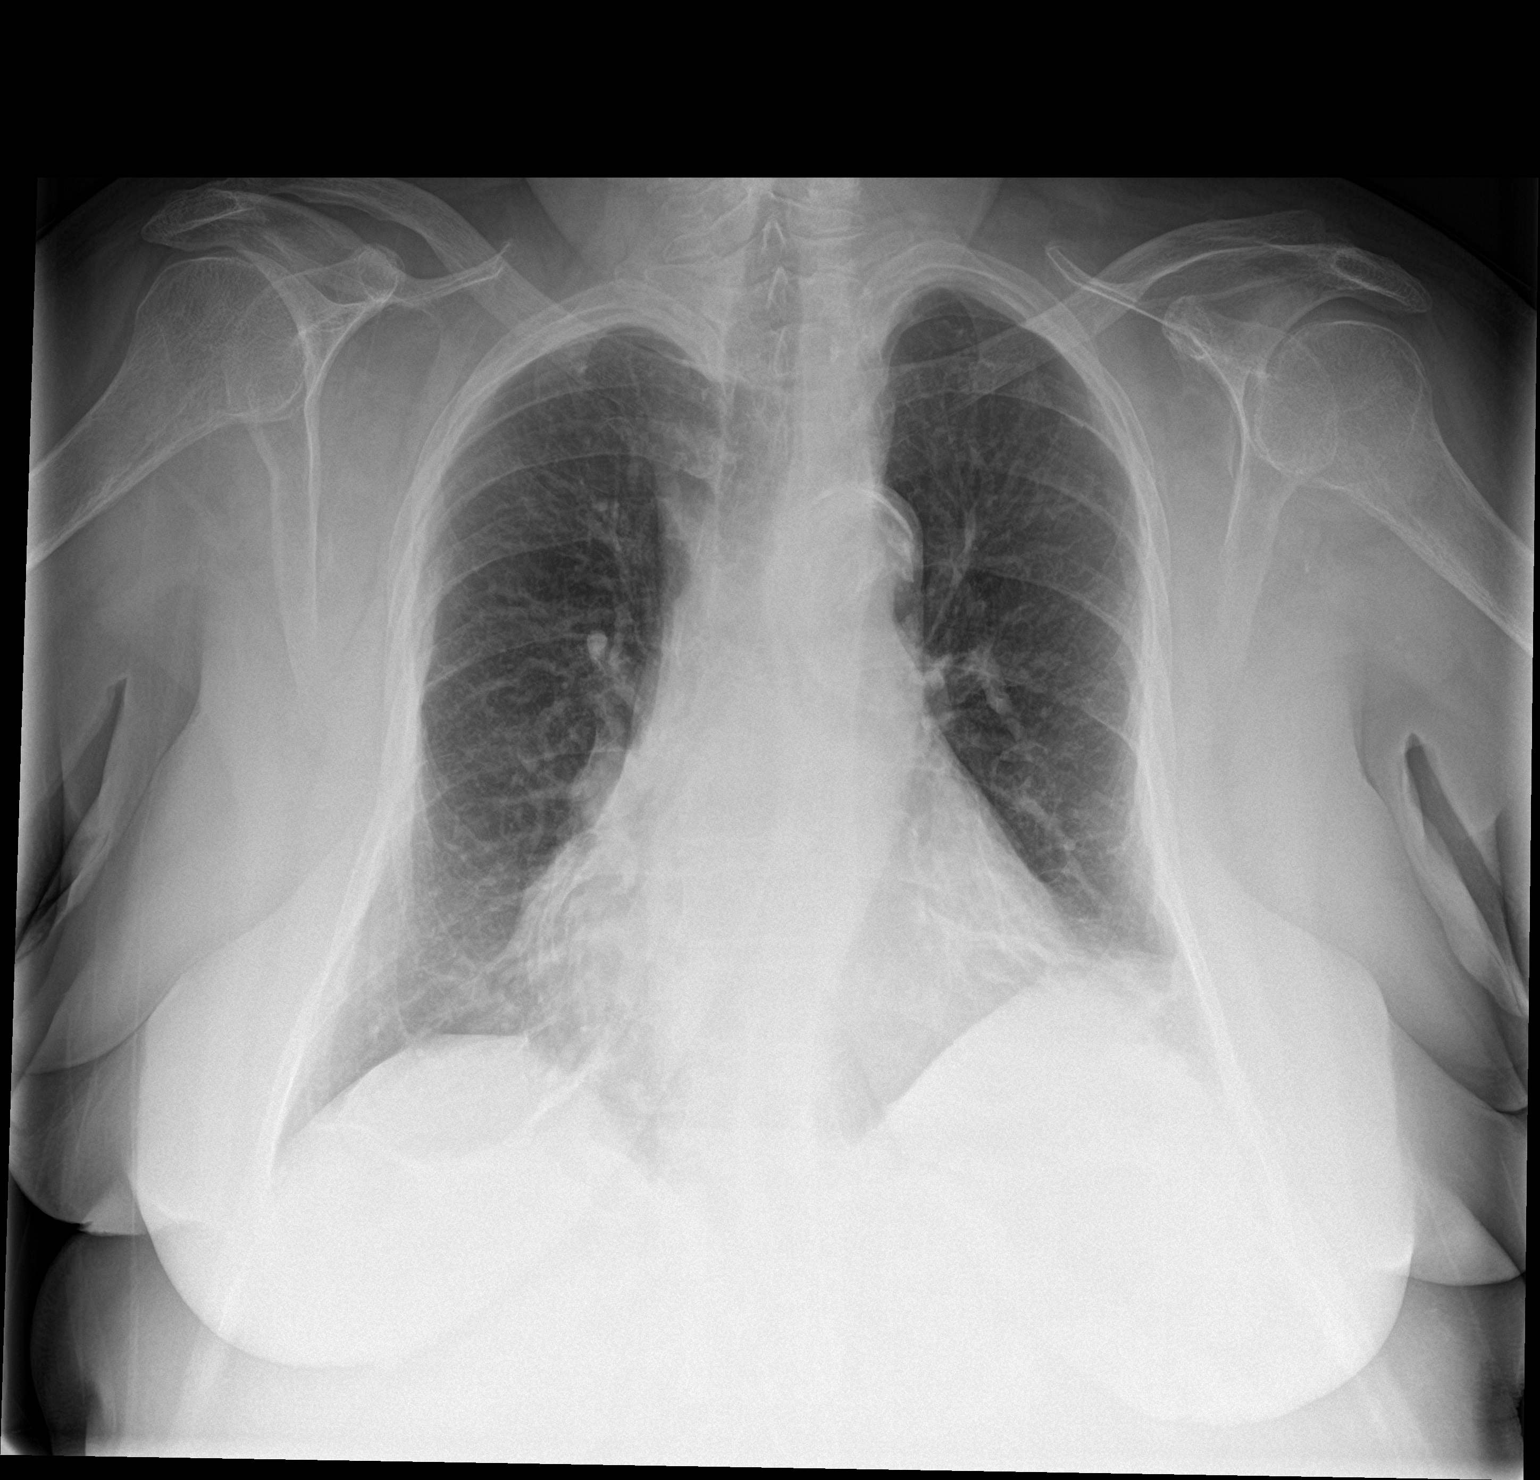

[chest lat]
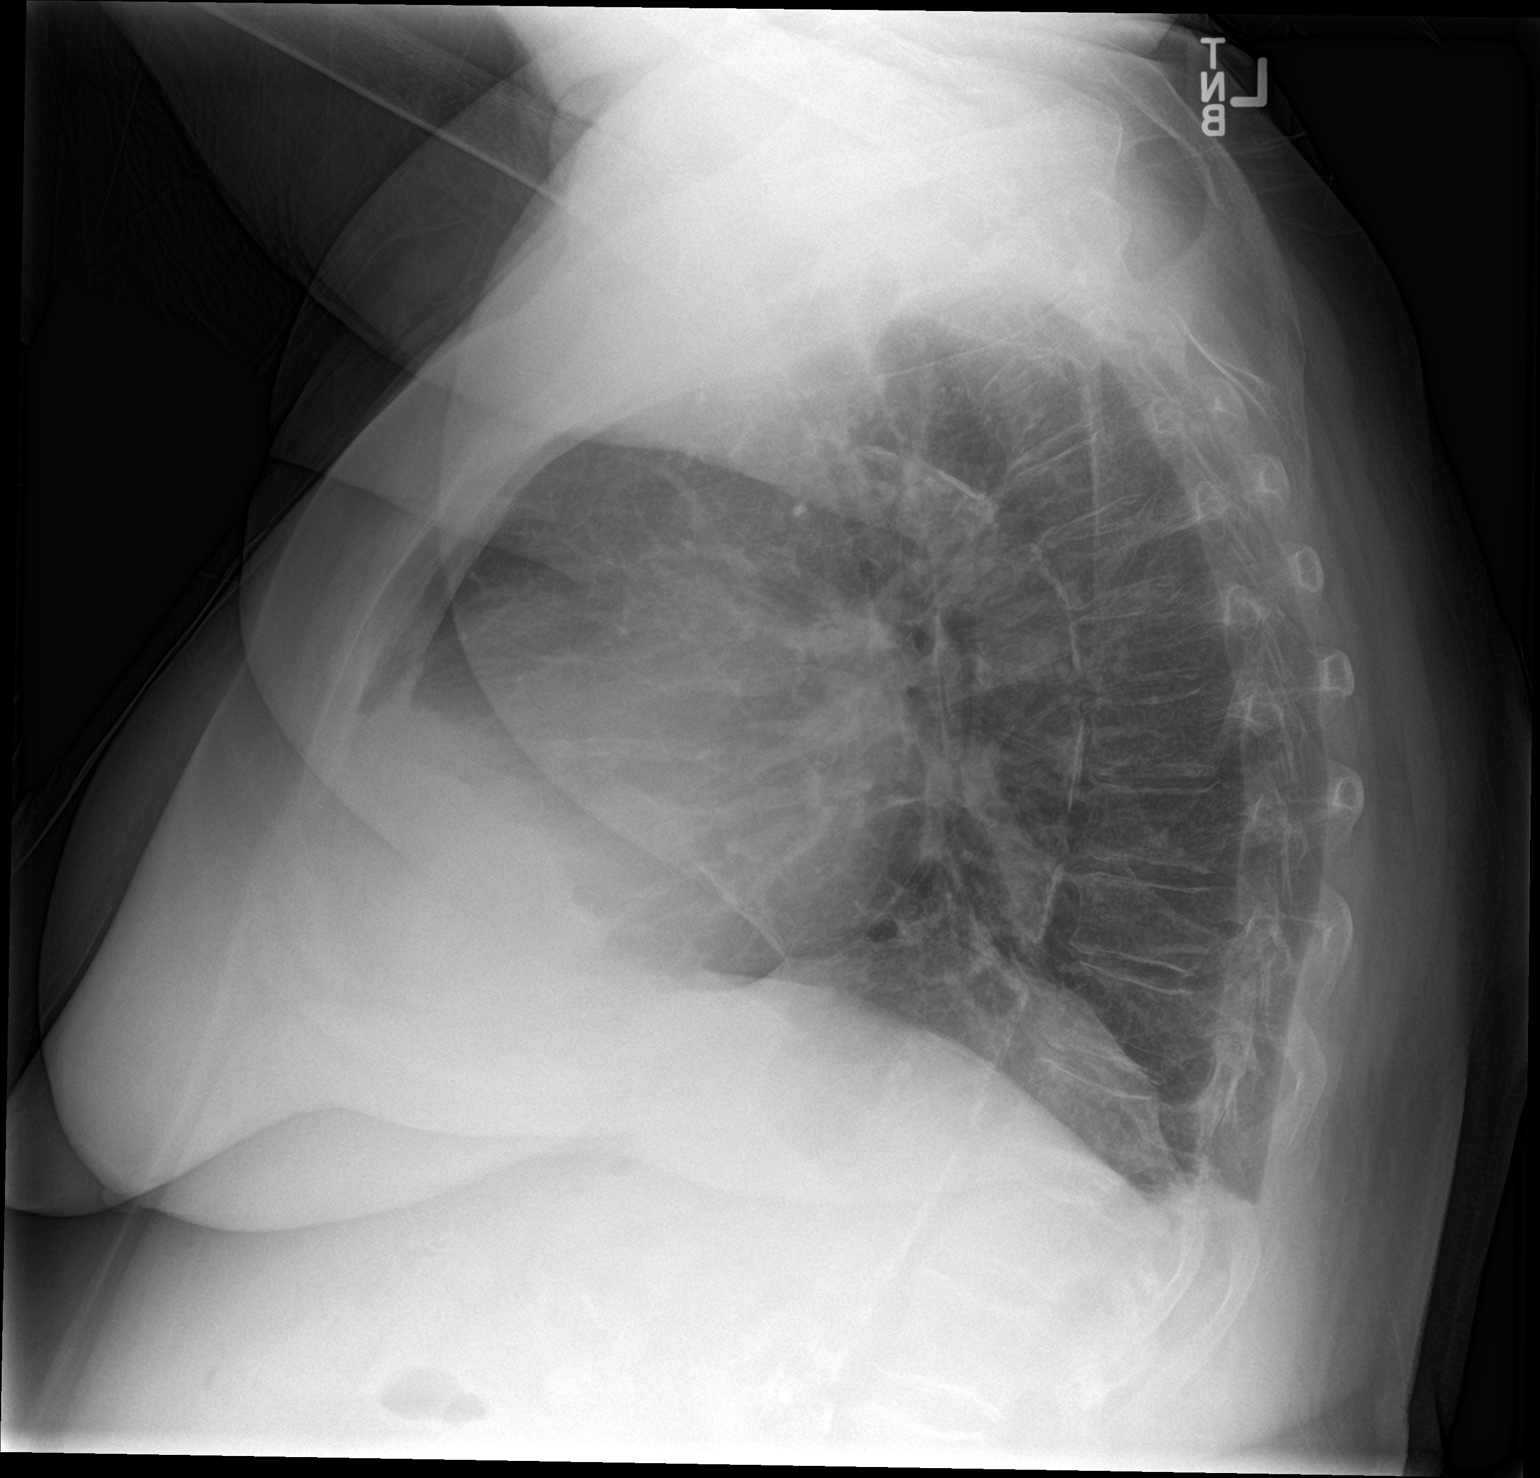

[2 of 2 positions shown; findings below may reference images not displayed]

FINDINGS: Stable cardiomegaly. Atherosclerosis of thoracic aorta is noted. No
pneumothorax or pleural effusion is noted. Mild bibasilar
subsegmental atelectasis or scarring is noted. Bony thorax is
unremarkable.
IMPRESSION: Mild bibasilar subsegmental atelectasis or scarring. Aortic
atherosclerosis.

## 2016-10-13 IMAGING — DX DG RIBS 2V*R*
2 series · 2 of 2 positions shown · non-contrast
Comparison: None.

CLINICAL DATA: Right chest pain after fall last week.

EXAM:
RIGHT RIBS - 2 VIEW

[rib pa]
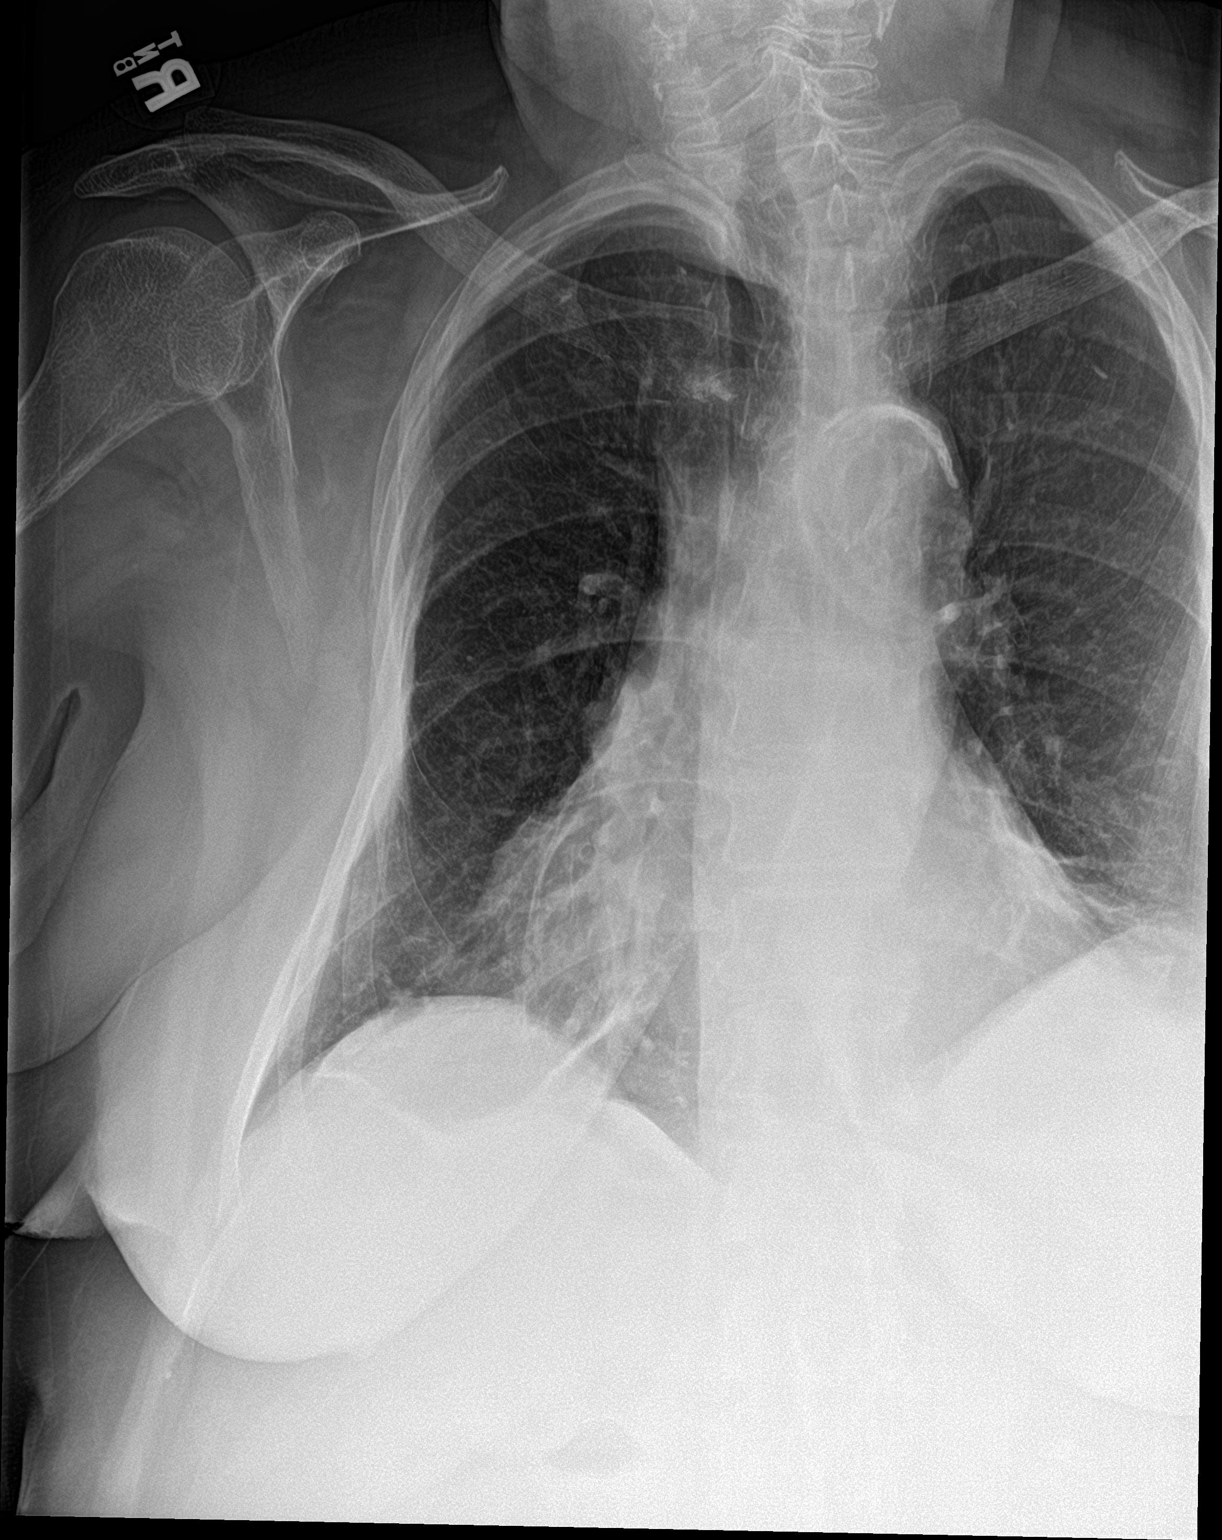

[rib pa obl]
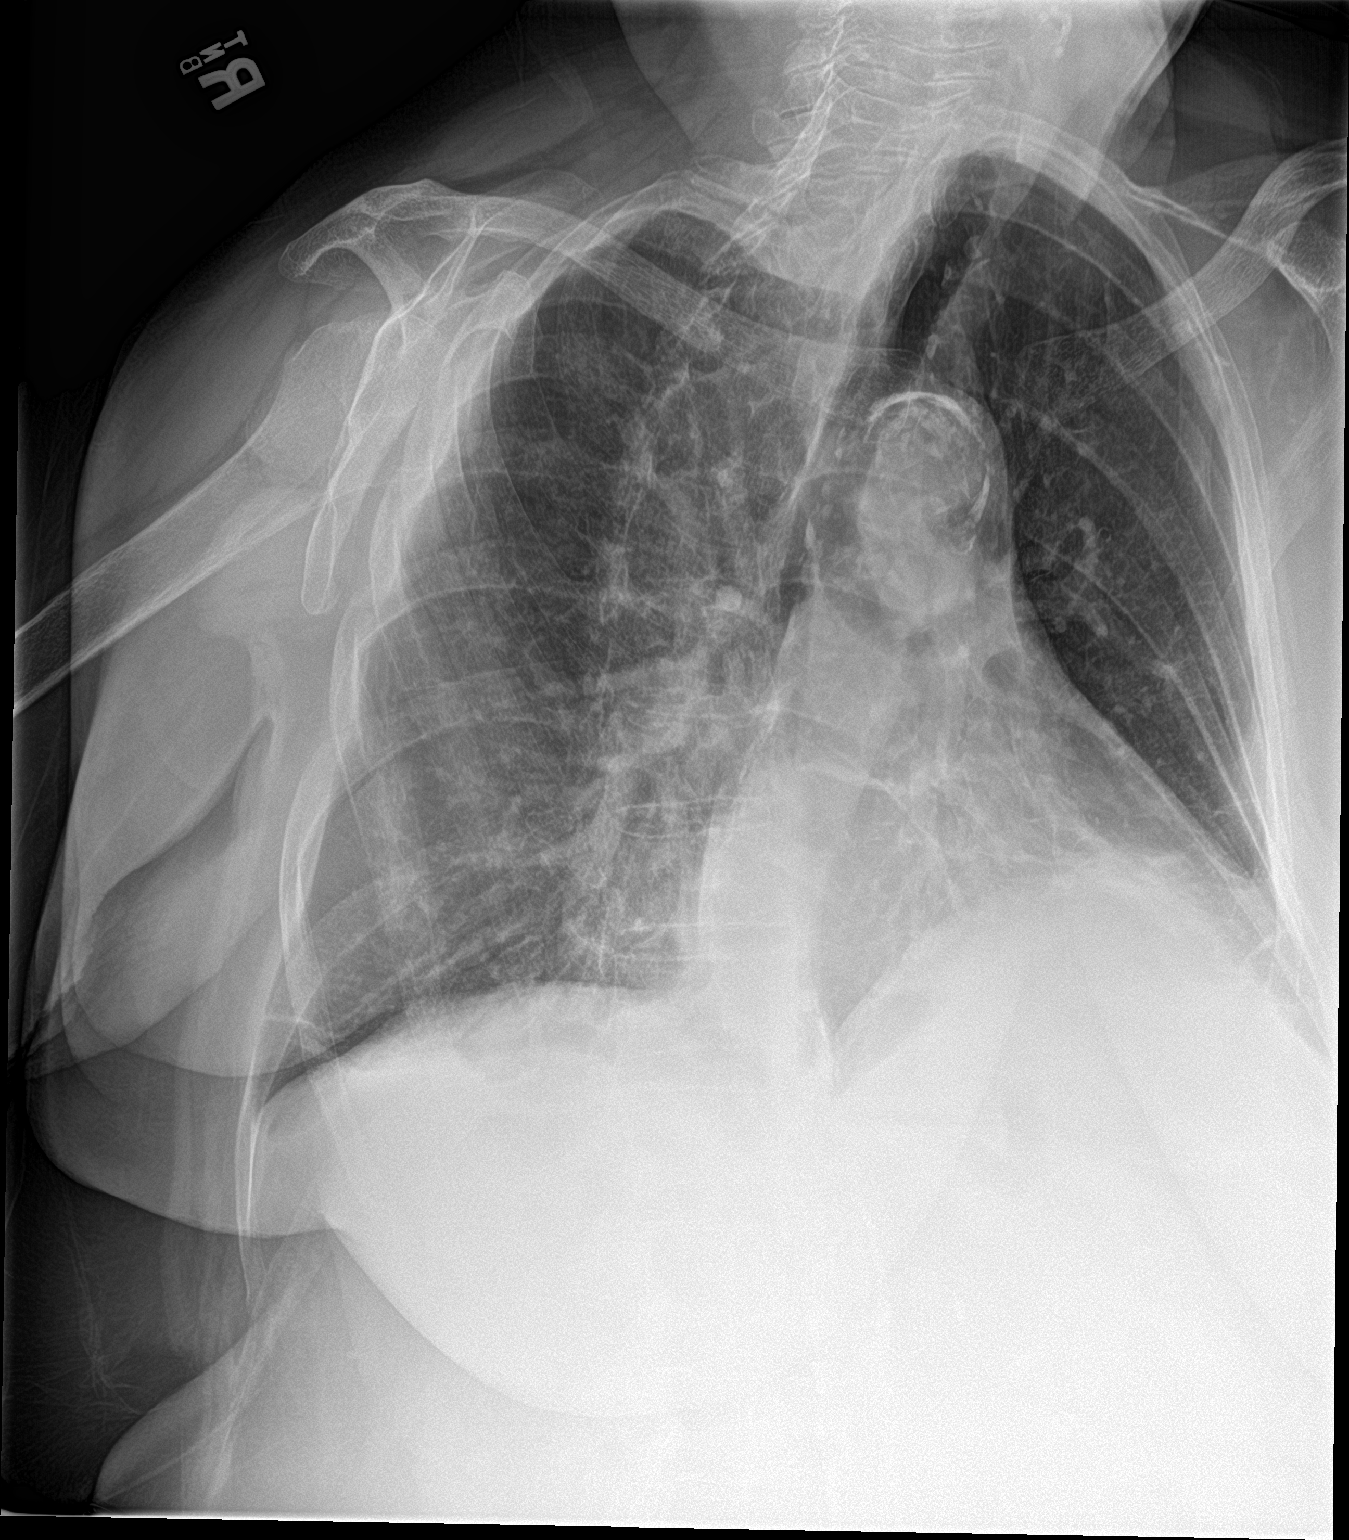

[2 of 2 positions shown; findings below may reference images not displayed]

FINDINGS: No fracture or other bone lesions are seen involving the ribs.
IMPRESSION: No definite abnormality seen involving the right ribs.

## 2016-11-20 ENCOUNTER — Other Ambulatory Visit: Payer: Self-pay | Admitting: Cardiology

## 2016-12-03 DIAGNOSIS — D509 Iron deficiency anemia, unspecified: Secondary | ICD-10-CM | POA: Diagnosis not present

## 2016-12-03 DIAGNOSIS — I10 Essential (primary) hypertension: Secondary | ICD-10-CM | POA: Diagnosis not present

## 2016-12-03 DIAGNOSIS — N183 Chronic kidney disease, stage 3 (moderate): Secondary | ICD-10-CM | POA: Diagnosis not present

## 2016-12-03 DIAGNOSIS — R809 Proteinuria, unspecified: Secondary | ICD-10-CM | POA: Diagnosis not present

## 2016-12-03 DIAGNOSIS — E559 Vitamin D deficiency, unspecified: Secondary | ICD-10-CM | POA: Diagnosis not present

## 2016-12-03 DIAGNOSIS — E538 Deficiency of other specified B group vitamins: Secondary | ICD-10-CM | POA: Diagnosis not present

## 2016-12-03 DIAGNOSIS — Z79899 Other long term (current) drug therapy: Secondary | ICD-10-CM | POA: Diagnosis not present

## 2016-12-10 DIAGNOSIS — E872 Acidosis: Secondary | ICD-10-CM | POA: Diagnosis not present

## 2016-12-10 DIAGNOSIS — N184 Chronic kidney disease, stage 4 (severe): Secondary | ICD-10-CM | POA: Diagnosis not present

## 2016-12-10 DIAGNOSIS — N2581 Secondary hyperparathyroidism of renal origin: Secondary | ICD-10-CM | POA: Diagnosis not present

## 2016-12-10 DIAGNOSIS — D638 Anemia in other chronic diseases classified elsewhere: Secondary | ICD-10-CM | POA: Diagnosis not present

## 2016-12-10 DIAGNOSIS — I10 Essential (primary) hypertension: Secondary | ICD-10-CM | POA: Diagnosis not present

## 2016-12-10 DIAGNOSIS — R809 Proteinuria, unspecified: Secondary | ICD-10-CM | POA: Diagnosis not present

## 2016-12-24 DIAGNOSIS — E119 Type 2 diabetes mellitus without complications: Secondary | ICD-10-CM | POA: Diagnosis not present

## 2016-12-24 DIAGNOSIS — H16223 Keratoconjunctivitis sicca, not specified as Sjogren's, bilateral: Secondary | ICD-10-CM | POA: Diagnosis not present

## 2017-01-06 NOTE — Progress Notes (Signed)
HPI: FU CAD; last catheterization in September 2008. At that time, she was found to have significant left circumflex disease as well as marginal disease to correlate with perfusion abnormality on her Myoview. However, it was felt that these are small vessels and would be difficult to intervene on either percutaneously or from a surgical standpoint. Her only other disease was in the PDA at 50-70%. Nuclear study May 2016 showed ejection fraction 76% and inferior lateral ischemia consistent with known circumflex disease. Carotid dopplers 7/17 showed 40-59 right and 1-39 left stenosis. Since I last saw her, she does have dyspnea on exertion which is unchanged. No orthopnea or PND. Occasional mild pedal edema. She typically does not have exertional chest pain. No syncope.  Current Outpatient Prescriptions  Medication Sig Dispense Refill  . acetaminophen (TYLENOL) 500 MG tablet Take 1,000 mg by mouth every 6 (six) hours as needed for mild pain or moderate pain.    Marland Kitchen amLODipine (NORVASC) 10 MG tablet Take 10 mg by mouth daily.      Marland Kitchen aspirin 81 MG tablet Take 81 mg by mouth every morning.     . cetirizine (ZYRTEC) 10 MG tablet Take 10 mg by mouth daily as needed for allergies.     . Cholecalciferol (VITAMIN D-3 PO) Take 2 tablets by mouth daily.    . cyanocobalamin (,VITAMIN B-12,) 1000 MCG/ML injection Inject 1,000 mcg into the muscle every 30 (thirty) days.    . diazepam (VALIUM) 10 MG tablet Take 10 mg by mouth every 6 (six) hours as needed. Takes 3-4 times per day as needed for being upset. Always takes 1 tablet at bedtime    . docusate sodium (COLACE) 100 MG capsule Take 100 mg by mouth 2 (two) times daily.    Marland Kitchen glipiZIDE (GLUCOTROL XL) 10 MG 24 hr tablet Take 10 mg by mouth 2 (two) times daily.     . insulin glargine (LANTUS) 100 UNIT/ML injection Inject 40 Units into the skin at bedtime. Adjusts amount based on her blood sugar levels    . isosorbide mononitrate (IMDUR) 30 MG 24 hr tablet Take  30 mg by mouth daily.     Marland Kitchen KRILL OIL PO Take 353 mg by mouth daily.    . Linaclotide (LINZESS) 145 MCG CAPS capsule Take 1 capsule (145 mcg total) by mouth daily. (Patient taking differently: Take 145 mcg by mouth as needed. ) 5 capsule 0  . Melatonin 3 MG TABS Take 3 mg by mouth at bedtime as needed (sleep).     . metoprolol (TOPROL-XL) 50 MG 24 hr tablet Take 50 mg by mouth daily.      . nitroGLYCERIN (NITROSTAT) 0.4 MG SL tablet Place 0.4 mg under the tongue every 5 (five) minutes x 3 doses as needed (last dose 1 week ago 03/05/2015). For chest pain    . rosuvastatin (CRESTOR) 40 MG tablet TAKE ONE TABLET BY MOUTH AT BEDTIME. 30 tablet 3  . furosemide (LASIX) 20 MG tablet Take 20 mg by mouth as needed. For decreasing your potassium level and for fluid retention.     No current facility-administered medications for this visit.      Past Medical History:  Diagnosis Date  . Anxiety disorder   . Cat allergies   . Cerebrovascular disease    CVA-left sided weakness  . Chronic kidney disease (CKD), stage III (moderate) 08/04/2011  . Coronary atherosclerosis of unspecified type of vessel, native or graft    MI x2  .  Depression   . Diabetes mellitus without complication ([name])   . Glaucoma   . Hemorrhoids   . Hyperlipidemia, mixed   . Kidney stones   . Polyclonal gammopathy 01/29/2012  . PVD (peripheral vascular disease) ([place])   . Unspecified essential hypertension     Past Surgical History:  Procedure Laterality Date  . ABDOMINAL HYSTERECTOMY    . CHOLECYSTECTOMY    . COLONOSCOPY  09/26/08   [id number] rectum/diminutive rectosigmoid polyp/pan colonic diverticula  . COLONOSCOPY N/A 03/14/2015   Procedure: COLONOSCOPY;  Surgeon: Daneil Dolin, MD;  Location: [facility];  Service: Endoscopy;  Laterality: N/A;  1030 - moved to 10:15 - office to notify  . KIDNEY STONE SURGERY    . laparoectomy    . polyectomy    . TUBAL LIGATION      Social History   Social History  . Marital  status: Married    Spouse name: N/A  . Number of children: N/A  . Years of education: N/A   Occupational History  . Retired    Social History Main Topics  . Smoking status: Former Research scientist (life sciences)  . Smokeless tobacco: Never Used  . Alcohol use No  . Drug use: No  . Sexual activity: Not on file   Other Topics Concern  . Not on file   Social History Narrative  . No narrative on file    Family History  Problem Relation Age of Onset  . Heart attack Father   . Kidney disease Mother   . Colon cancer Other        aunt  . Colon polyps Son   . Colon polyps Other        siblings    ROS: no fevers or chills, productive cough, hemoptysis, dysphasia, odynophagia, melena, hematochezia, dysuria, hematuria, rash, seizure activity, orthopnea, PND, claudication. Remaining systems are negative.  Physical Exam: Well-developed well-nourished in no acute distress.  Skin is warm and dry.  HEENT is normal.  Neck is supple.  Chest is clear to auscultation with normal expansion.  Cardiovascular exam is regular rate and rhythm.  Abdominal exam nontender or distended. No masses palpated. Extremities show trace edema. neuro grossly intact  ECG-Sinus rhythm at a rate of 64. Nonspecific ST changes. personally reviewed  A/P  1 coronary artery disease-continue aspirin and statin. Occasional brief CP unchanged. Previous nuclear study showed ischemia and that correlated with known disease. We are treating conservatively.  2 carotid artery disease-await results of follow-up carotid Dopplers performed today. Continue aspirin and statin.  3 hypertension-blood pressure controlled. Continue present medications.  4 hyperlipidemia-continue statin. Check lipids and liver.  5 chronic stage III kidney disease-monitored by nephrology. Recent creatinine 2.11.  Kirk Ruths, MD

## 2017-01-08 DIAGNOSIS — E538 Deficiency of other specified B group vitamins: Secondary | ICD-10-CM | POA: Diagnosis not present

## 2017-01-12 ENCOUNTER — Ambulatory Visit (INDEPENDENT_AMBULATORY_CARE_PROVIDER_SITE_OTHER): Payer: PPO | Admitting: Cardiology

## 2017-01-12 ENCOUNTER — Encounter: Payer: Self-pay | Admitting: Cardiology

## 2017-01-12 ENCOUNTER — Ambulatory Visit (HOSPITAL_COMMUNITY)
Admission: RE | Admit: 2017-01-12 | Discharge: 2017-01-12 | Disposition: A | Discharge status: Home / Self Care | Payer: PPO | Source: Ambulatory Visit | Attending: Cardiovascular Disease | Admitting: Cardiovascular Disease

## 2017-01-12 VITALS — BP 138/56 | HR 64 | Ht 61.0 in | Wt 162.0 lb

## 2017-01-12 DIAGNOSIS — I10 Essential (primary) hypertension: Secondary | ICD-10-CM

## 2017-01-12 DIAGNOSIS — I779 Disorder of arteries and arterioles, unspecified: Secondary | ICD-10-CM

## 2017-01-12 DIAGNOSIS — E7800 Pure hypercholesterolemia, unspecified: Secondary | ICD-10-CM | POA: Diagnosis not present

## 2017-01-12 DIAGNOSIS — I2510 Atherosclerotic heart disease of native coronary artery without angina pectoris: Secondary | ICD-10-CM | POA: Diagnosis not present

## 2017-01-12 DIAGNOSIS — I739 Peripheral vascular disease, unspecified: Secondary | ICD-10-CM

## 2017-01-12 DIAGNOSIS — I6523 Occlusion and stenosis of bilateral carotid arteries: Secondary | ICD-10-CM | POA: Diagnosis not present

## 2017-01-12 NOTE — Patient Instructions (Signed)
Medication Instructions:   NO CHANGE  Labwork:  Your physician recommends that you return for lab work ONE MONTH  Follow-Up:  Your physician wants you to follow-up in: [place] will receive a reminder letter in the mail two months in advance. If you don't receive a letter, please call our office to schedule the follow-up appointment.   If you need a refill on your cardiac medications before your next appointment, please call your pharmacy.

## 2017-02-03 DIAGNOSIS — L821 Other seborrheic keratosis: Secondary | ICD-10-CM | POA: Diagnosis not present

## 2017-02-03 DIAGNOSIS — L720 Epidermal cyst: Secondary | ICD-10-CM | POA: Diagnosis not present

## 2017-02-03 DIAGNOSIS — L570 Actinic keratosis: Secondary | ICD-10-CM | POA: Diagnosis not present

## 2017-02-03 DIAGNOSIS — L820 Inflamed seborrheic keratosis: Secondary | ICD-10-CM | POA: Diagnosis not present

## 2017-02-03 DIAGNOSIS — D485 Neoplasm of uncertain behavior of skin: Secondary | ICD-10-CM | POA: Diagnosis not present

## 2017-02-11 DIAGNOSIS — D509 Iron deficiency anemia, unspecified: Secondary | ICD-10-CM | POA: Diagnosis not present

## 2017-02-11 DIAGNOSIS — I2510 Atherosclerotic heart disease of native coronary artery without angina pectoris: Secondary | ICD-10-CM | POA: Diagnosis not present

## 2017-02-11 DIAGNOSIS — R809 Proteinuria, unspecified: Secondary | ICD-10-CM | POA: Diagnosis not present

## 2017-02-11 DIAGNOSIS — E559 Vitamin D deficiency, unspecified: Secondary | ICD-10-CM | POA: Diagnosis not present

## 2017-02-11 DIAGNOSIS — Z79899 Other long term (current) drug therapy: Secondary | ICD-10-CM | POA: Diagnosis not present

## 2017-02-11 DIAGNOSIS — Z23 Encounter for immunization: Secondary | ICD-10-CM | POA: Diagnosis not present

## 2017-02-11 DIAGNOSIS — I10 Essential (primary) hypertension: Secondary | ICD-10-CM | POA: Diagnosis not present

## 2017-02-11 DIAGNOSIS — N183 Chronic kidney disease, stage 3 (moderate): Secondary | ICD-10-CM | POA: Diagnosis not present

## 2017-02-11 DIAGNOSIS — E538 Deficiency of other specified B group vitamins: Secondary | ICD-10-CM | POA: Diagnosis not present

## 2017-02-12 ENCOUNTER — Encounter: Payer: Self-pay | Admitting: *Deleted

## 2017-02-12 LAB — HEPATIC FUNCTION PANEL
ALK PHOS: 78 IU/L (ref 39–117)
ALT: 14 IU/L (ref 0–32)
AST: 12 IU/L (ref 0–40)
Albumin: 4.2 g/dL (ref 3.5–4.8)
BILIRUBIN, DIRECT: 0.2 mg/dL (ref 0.00–0.40)
Bilirubin Total: 0.7 mg/dL (ref 0.0–1.2)
Total Protein: 6.4 g/dL (ref 6.0–8.5)

## 2017-02-12 LAB — LIPID PANEL
CHOLESTEROL TOTAL: 124 mg/dL (ref 100–199)
Chol/HDL Ratio: 2.7 ratio (ref 0.0–4.4)
HDL: 46 mg/dL (ref >39)
LDL Calculated: 44 mg/dL (ref 0–99)
TRIGLYCERIDES: 172 mg/dL — AB (ref 0–149)
VLDL Cholesterol Cal: 34 mg/dL (ref 5–40)

## 2017-02-18 DIAGNOSIS — I10 Essential (primary) hypertension: Secondary | ICD-10-CM | POA: Diagnosis not present

## 2017-02-18 DIAGNOSIS — E872 Acidosis: Secondary | ICD-10-CM | POA: Diagnosis not present

## 2017-02-18 DIAGNOSIS — N2581 Secondary hyperparathyroidism of renal origin: Secondary | ICD-10-CM | POA: Diagnosis not present

## 2017-02-18 DIAGNOSIS — D638 Anemia in other chronic diseases classified elsewhere: Secondary | ICD-10-CM | POA: Diagnosis not present

## 2017-02-18 DIAGNOSIS — N184 Chronic kidney disease, stage 4 (severe): Secondary | ICD-10-CM | POA: Diagnosis not present

## 2017-02-18 DIAGNOSIS — R809 Proteinuria, unspecified: Secondary | ICD-10-CM | POA: Diagnosis not present

## 2017-02-23 DIAGNOSIS — H16223 Keratoconjunctivitis sicca, not specified as Sjogren's, bilateral: Secondary | ICD-10-CM | POA: Diagnosis not present

## 2017-03-10 ENCOUNTER — Other Ambulatory Visit: Payer: Self-pay | Admitting: Cardiology

## 2017-03-10 NOTE — Telephone Encounter (Signed)
REFILL 

## 2017-03-24 DIAGNOSIS — E538 Deficiency of other specified B group vitamins: Secondary | ICD-10-CM | POA: Diagnosis not present

## 2017-04-16 ENCOUNTER — Other Ambulatory Visit: Payer: Self-pay

## 2017-04-16 ENCOUNTER — Encounter (HOSPITAL_COMMUNITY): Payer: Self-pay

## 2017-04-16 ENCOUNTER — Emergency Department (HOSPITAL_COMMUNITY): Payer: PPO

## 2017-04-16 ENCOUNTER — Emergency Department (HOSPITAL_COMMUNITY)
Admission: EM | Admit: 2017-04-16 | Discharge: 2017-04-17 | Disposition: A | Discharge status: Home / Self Care | Payer: PPO | Attending: Emergency Medicine | Admitting: Emergency Medicine

## 2017-04-16 DIAGNOSIS — Z87891 Personal history of nicotine dependence: Secondary | ICD-10-CM | POA: Insufficient documentation

## 2017-04-16 DIAGNOSIS — Z79899 Other long term (current) drug therapy: Secondary | ICD-10-CM | POA: Insufficient documentation

## 2017-04-16 DIAGNOSIS — M25551 Pain in right hip: Secondary | ICD-10-CM | POA: Diagnosis not present

## 2017-04-16 DIAGNOSIS — E1122 Type 2 diabetes mellitus with diabetic chronic kidney disease: Secondary | ICD-10-CM | POA: Diagnosis not present

## 2017-04-16 DIAGNOSIS — M25521 Pain in right elbow: Secondary | ICD-10-CM | POA: Diagnosis not present

## 2017-04-16 DIAGNOSIS — I129 Hypertensive chronic kidney disease with stage 1 through stage 4 chronic kidney disease, or unspecified chronic kidney disease: Secondary | ICD-10-CM | POA: Insufficient documentation

## 2017-04-16 DIAGNOSIS — Z7982 Long term (current) use of aspirin: Secondary | ICD-10-CM | POA: Insufficient documentation

## 2017-04-16 DIAGNOSIS — S59901A Unspecified injury of right elbow, initial encounter: Secondary | ICD-10-CM | POA: Diagnosis not present

## 2017-04-16 DIAGNOSIS — S82044A Nondisplaced comminuted fracture of right patella, initial encounter for closed fracture: Secondary | ICD-10-CM | POA: Diagnosis not present

## 2017-04-16 DIAGNOSIS — Z8673 Personal history of transient ischemic attack (TIA), and cerebral infarction without residual deficits: Secondary | ICD-10-CM | POA: Insufficient documentation

## 2017-04-16 DIAGNOSIS — Z794 Long term (current) use of insulin: Secondary | ICD-10-CM | POA: Diagnosis not present

## 2017-04-16 DIAGNOSIS — T148XXA Other injury of unspecified body region, initial encounter: Secondary | ICD-10-CM | POA: Diagnosis not present

## 2017-04-16 DIAGNOSIS — Y92019 Unspecified place in single-family (private) house as the place of occurrence of the external cause: Secondary | ICD-10-CM | POA: Insufficient documentation

## 2017-04-16 DIAGNOSIS — Y998 Other external cause status: Secondary | ICD-10-CM | POA: Diagnosis not present

## 2017-04-16 DIAGNOSIS — M25571 Pain in right ankle and joints of right foot: Secondary | ICD-10-CM | POA: Diagnosis not present

## 2017-04-16 DIAGNOSIS — N183 Chronic kidney disease, stage 3 (moderate): Secondary | ICD-10-CM | POA: Diagnosis not present

## 2017-04-16 DIAGNOSIS — W19XXXA Unspecified fall, initial encounter: Secondary | ICD-10-CM | POA: Insufficient documentation

## 2017-04-16 DIAGNOSIS — Y9301 Activity, walking, marching and hiking: Secondary | ICD-10-CM | POA: Diagnosis not present

## 2017-04-16 DIAGNOSIS — S82001A Unspecified fracture of right patella, initial encounter for closed fracture: Secondary | ICD-10-CM | POA: Diagnosis not present

## 2017-04-16 DIAGNOSIS — S79911A Unspecified injury of right hip, initial encounter: Secondary | ICD-10-CM | POA: Diagnosis not present

## 2017-04-16 DIAGNOSIS — I459 Conduction disorder, unspecified: Secondary | ICD-10-CM | POA: Diagnosis not present

## 2017-04-16 DIAGNOSIS — S8991XA Unspecified injury of right lower leg, initial encounter: Secondary | ICD-10-CM | POA: Diagnosis present

## 2017-04-16 DIAGNOSIS — M25561 Pain in right knee: Secondary | ICD-10-CM | POA: Diagnosis not present

## 2017-04-16 DIAGNOSIS — S99911A Unspecified injury of right ankle, initial encounter: Secondary | ICD-10-CM | POA: Diagnosis not present

## 2017-04-16 LAB — CBG MONITORING, ED: Glucose-Capillary: 189 mg/dL — ABNORMAL HIGH (ref 65–99)

## 2017-04-16 IMAGING — DX DG KNEE COMPLETE 4+V*R*
4 series · 4 of 4 positions shown · non-contrast
Comparison: None.

CLINICAL DATA: Pain following fall

EXAM:
RIGHT KNEE - COMPLETE 4+ VIEW

[knee ap (1 of 3)]
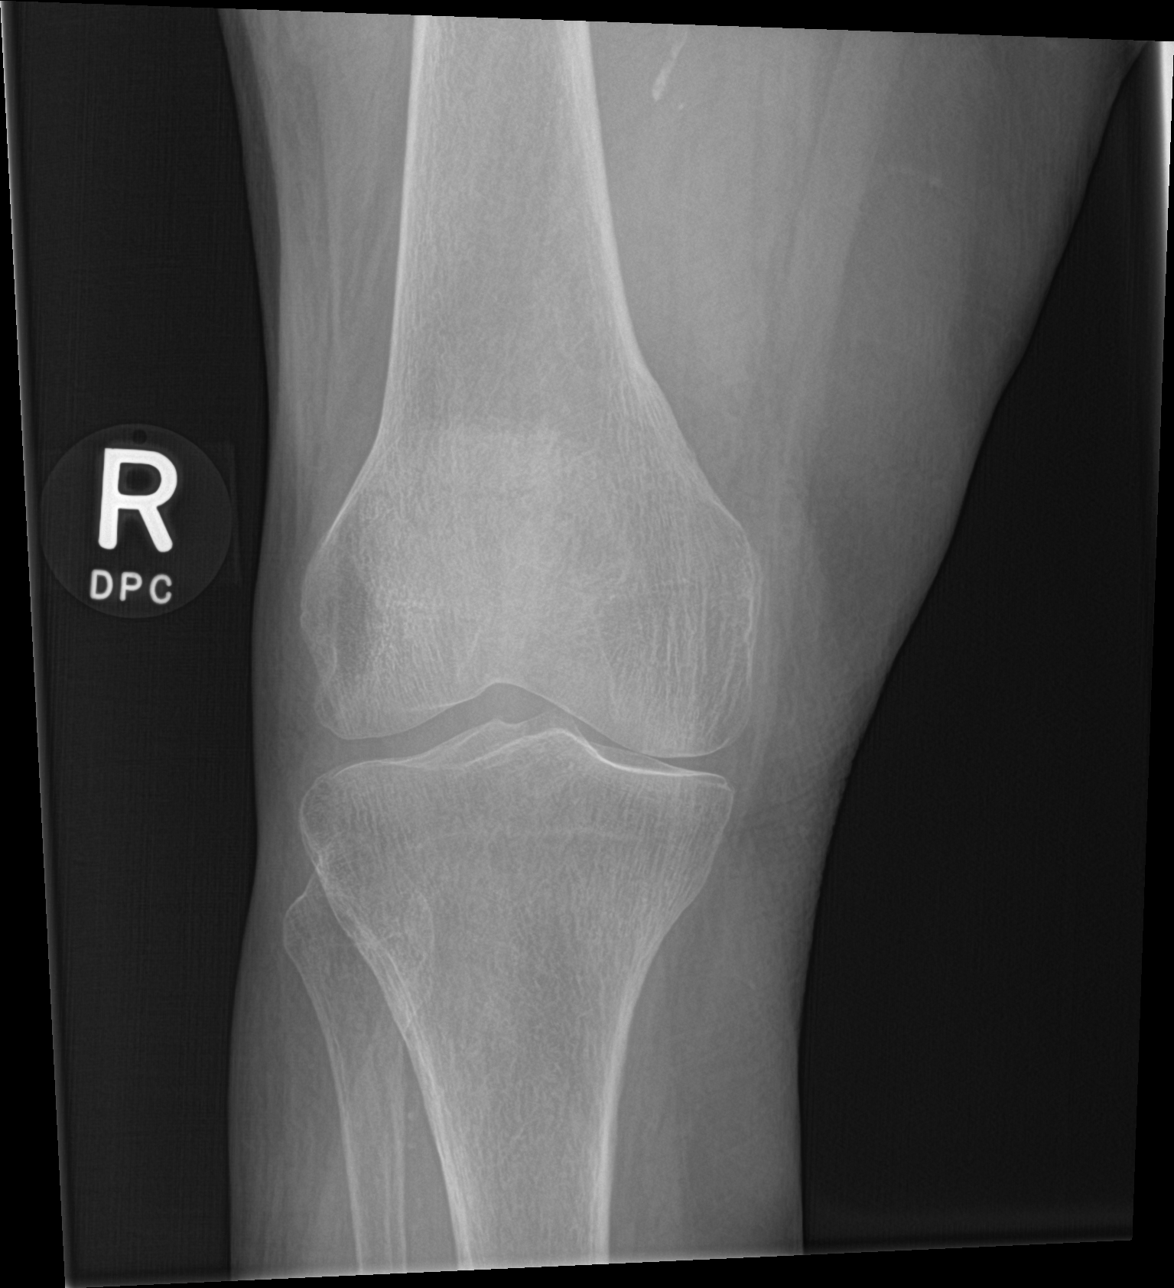

[knee lat]
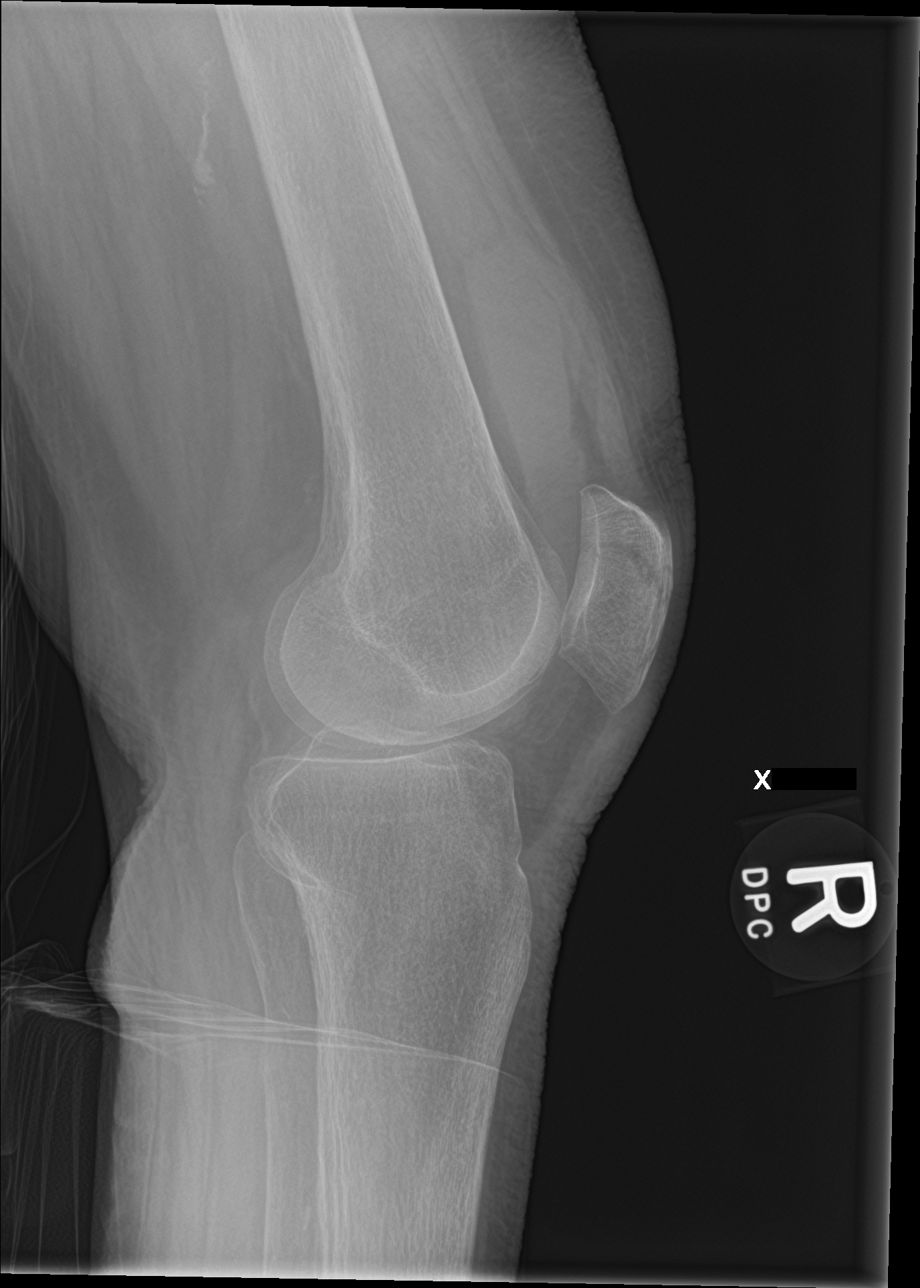

[knee ap (2 of 3)]
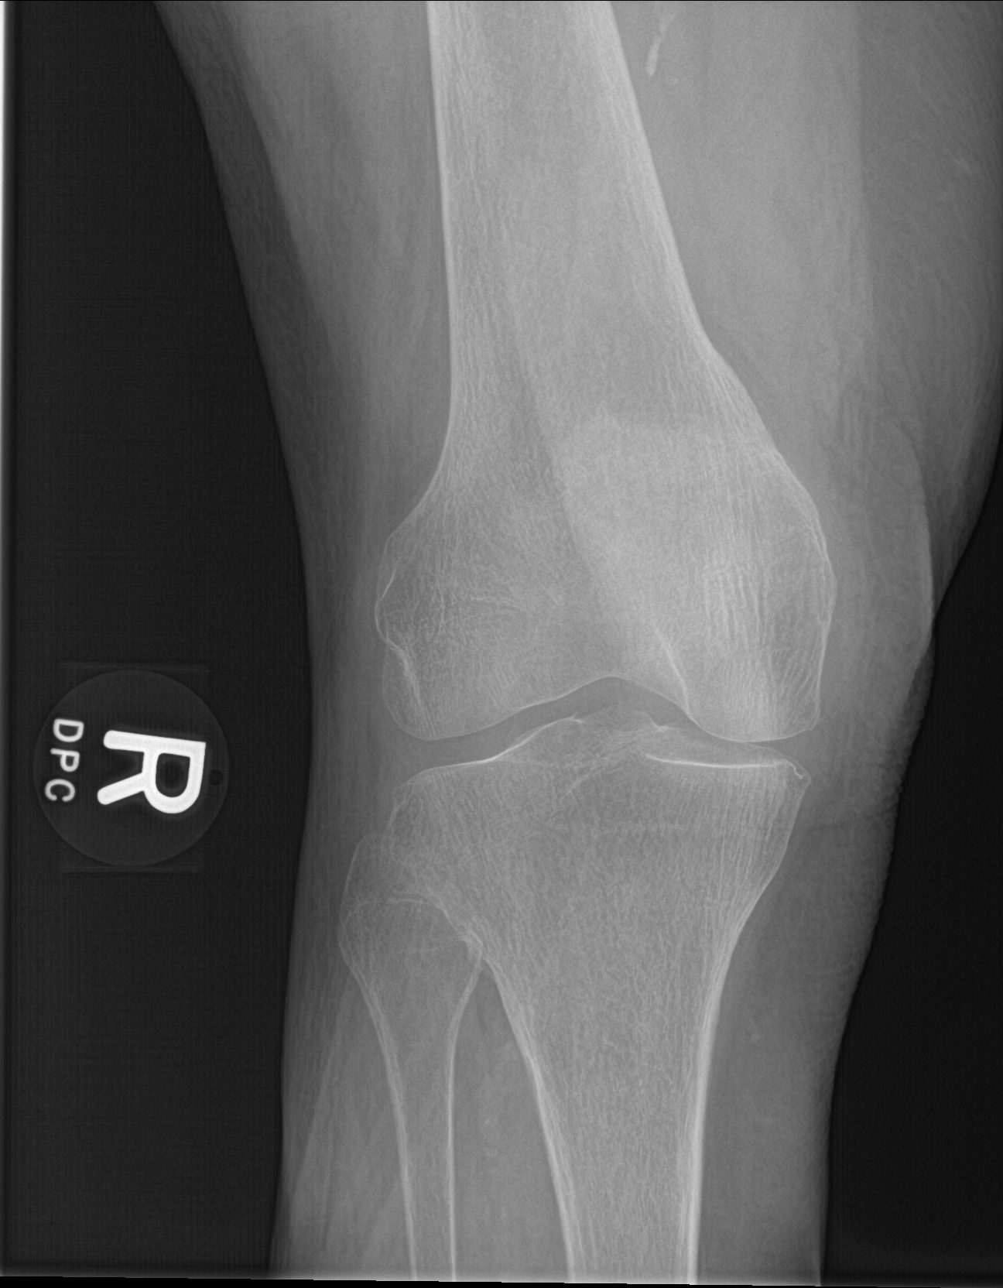

[knee ap (3 of 3)]
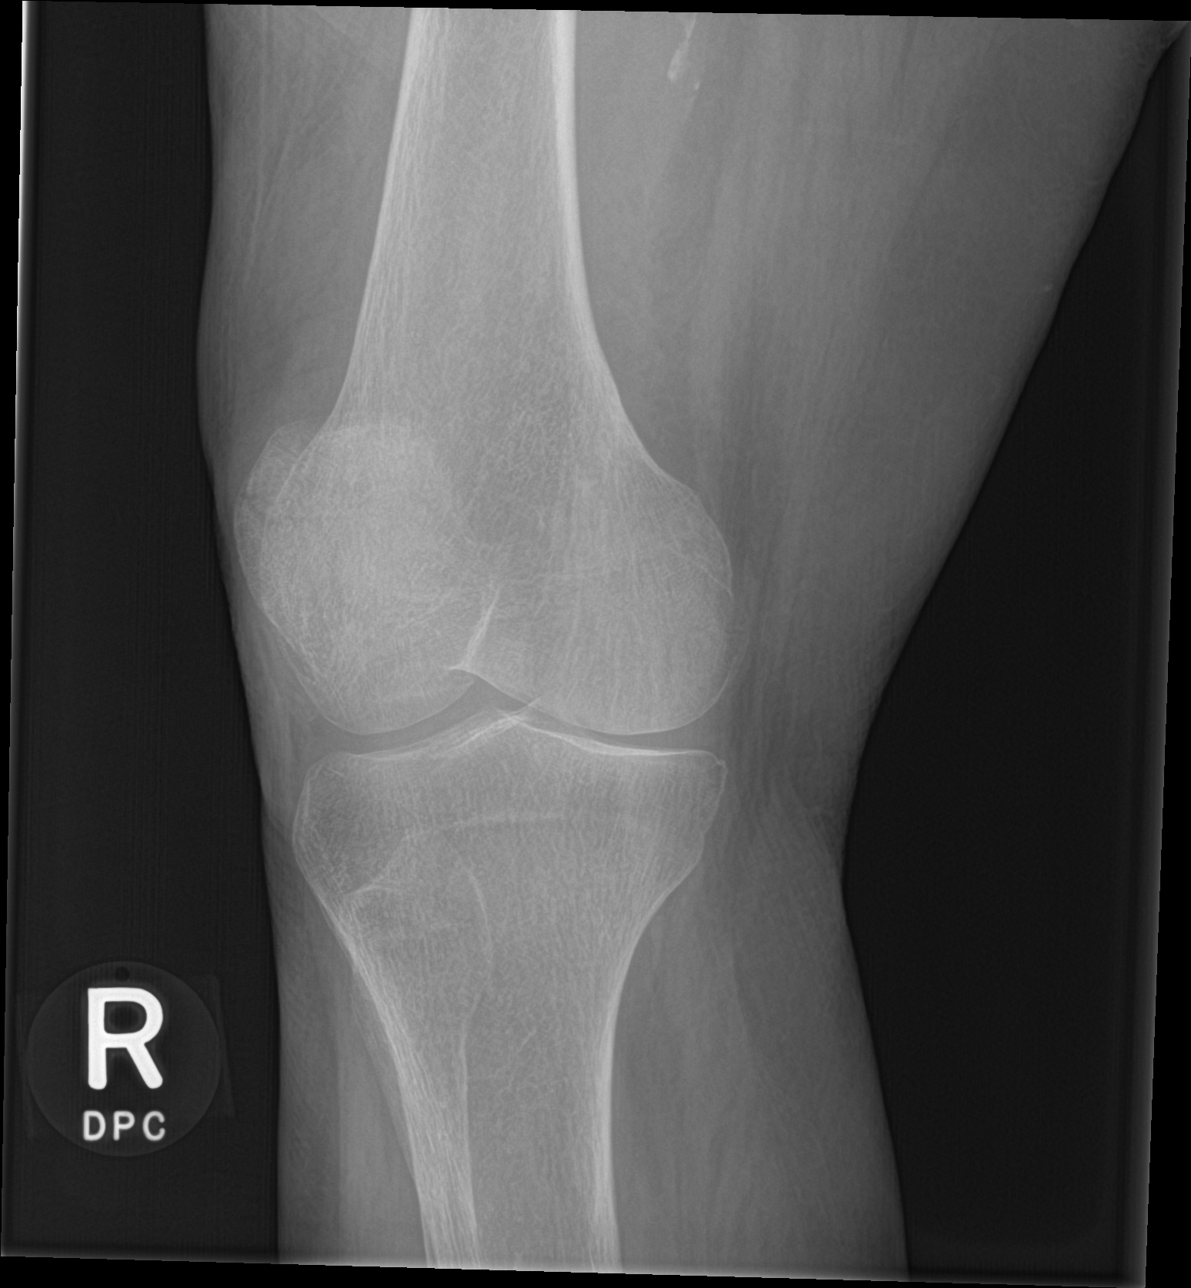

[4 of 4 positions shown; findings below may reference images not displayed]

FINDINGS: Frontal, lateral, and bilateral oblique views were obtained. There
is a nondisplaced fracture at the junction of the proximal and mid
thirds of the patella. There is a large joint effusion. No other
fracture. No dislocation. There is moderate narrowing medially.
IMPRESSION: Nondisplaced fracture of the proximal patella with large joint
effusion. No dislocation. Moderate joint space narrowing medially.

## 2017-04-16 IMAGING — DX DG ANKLE COMPLETE 3+V*R*
3 series · 3 of 3 positions shown · non-contrast
Comparison: None.

CLINICAL DATA: Pain following fall

EXAM:
RIGHT ANKLE - COMPLETE 3+ VIEW

[ankle ap]
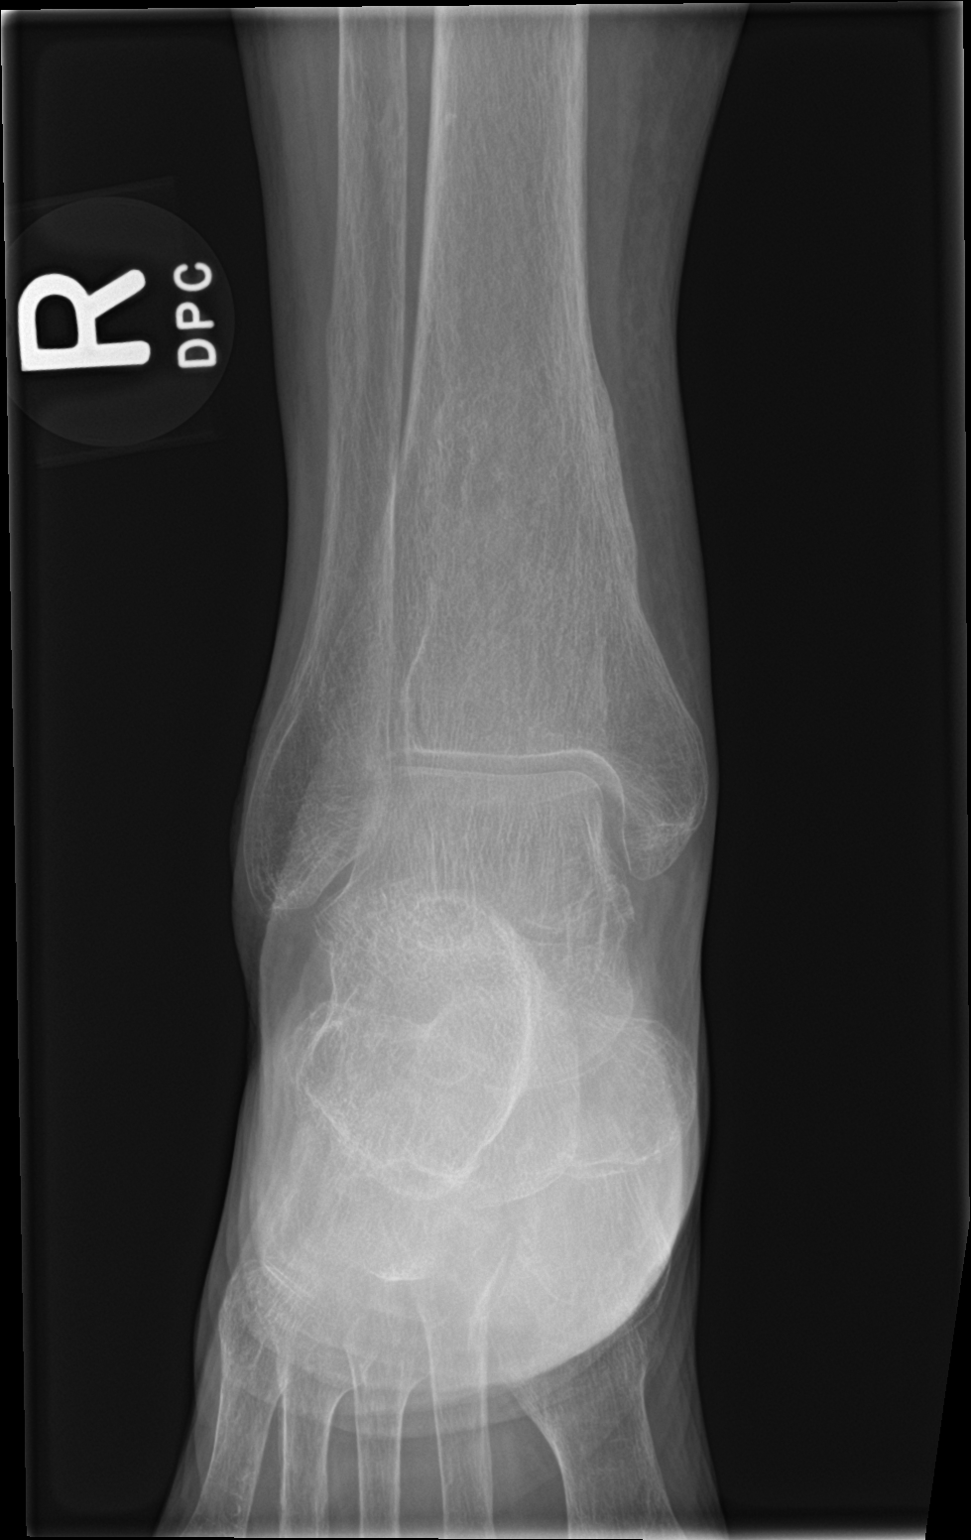

[ankle obl]
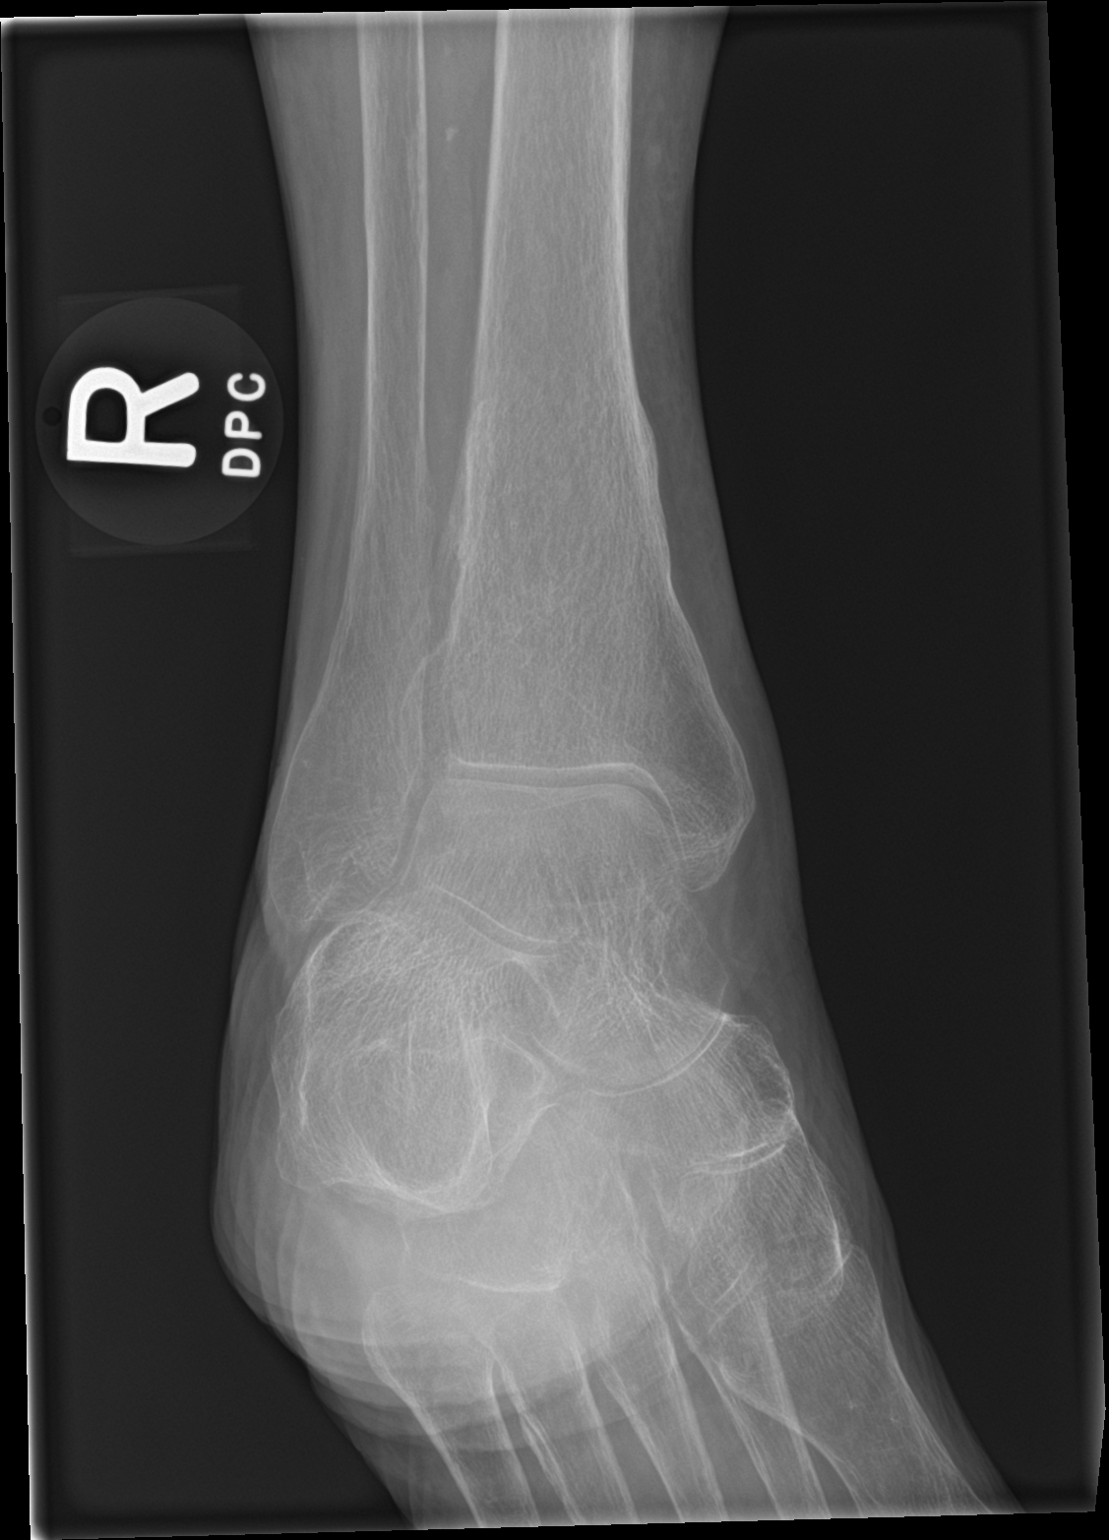

[ankle lat]
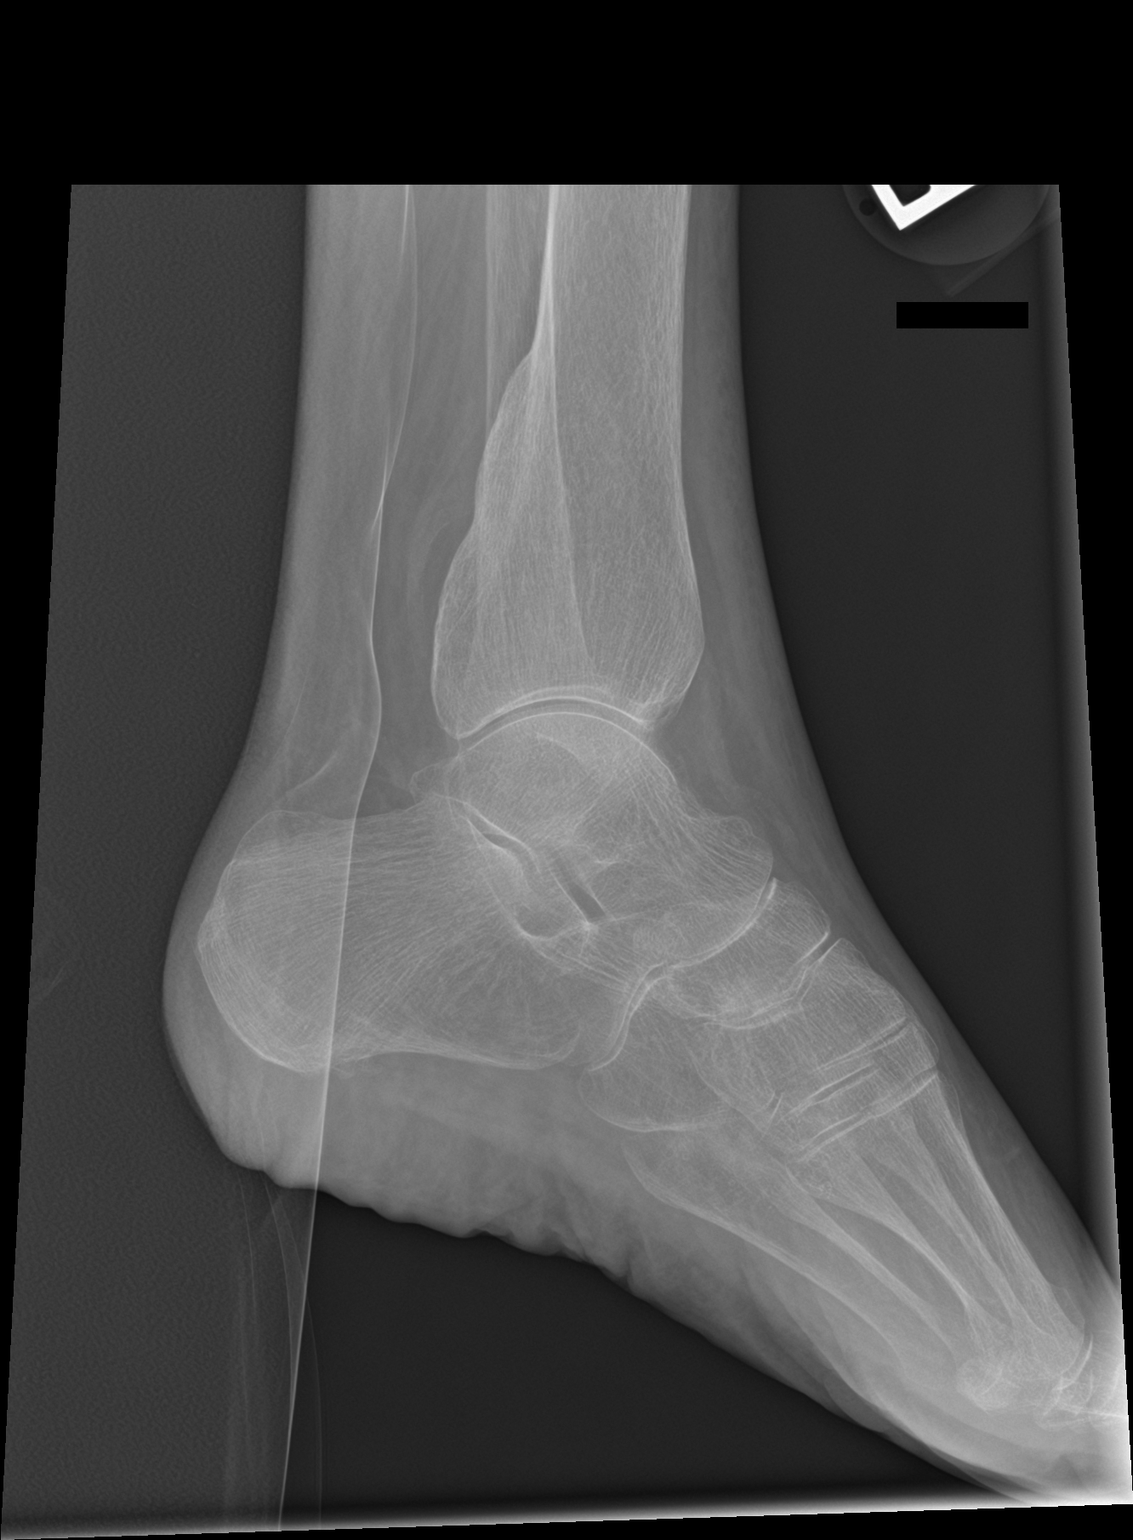

[3 of 3 positions shown; findings below may reference images not displayed]

FINDINGS: Frontal, oblique, and lateral views were obtained. There is evidence
of old trauma involving the distal tibia with remodeling. No acute
fracture evident. No joint effusion. There is mild narrowing in the
medial ankle joint region. Ankle mortise appears intact. Bones are
osteoporotic.
IMPRESSION: Old trauma with remodeling distal tibia. No acute fracture evident.
Bones osteoporotic. Ankle mortise appears intact. There is
osteoarthritic change in the medial portion of the ankle joint.

## 2017-04-16 IMAGING — DX DG ELBOW COMPLETE 3+V*R*
4 series · 4 of 4 positions shown · non-contrast
Comparison: None.

CLINICAL DATA: Pain following fall

EXAM:
RIGHT ELBOW - COMPLETE 3+ VIEW

[elbow ap]
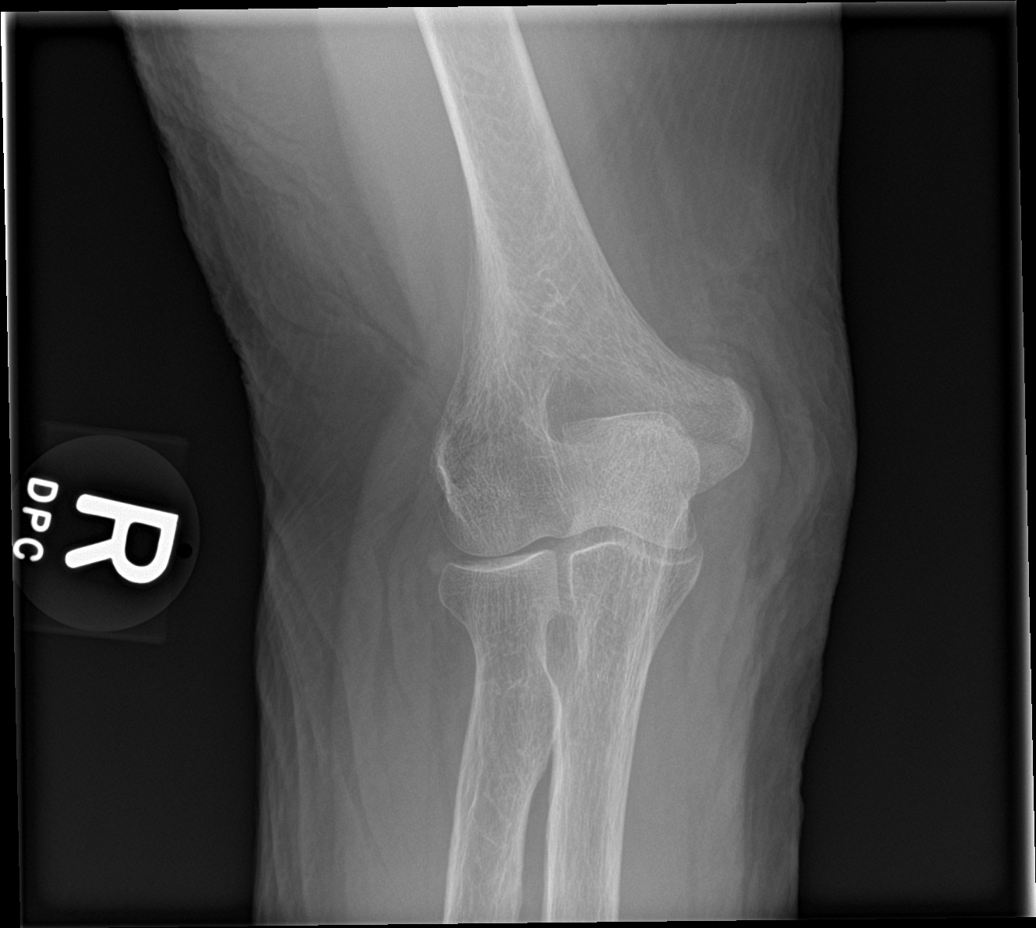

[elbow obl (1 of 2)]
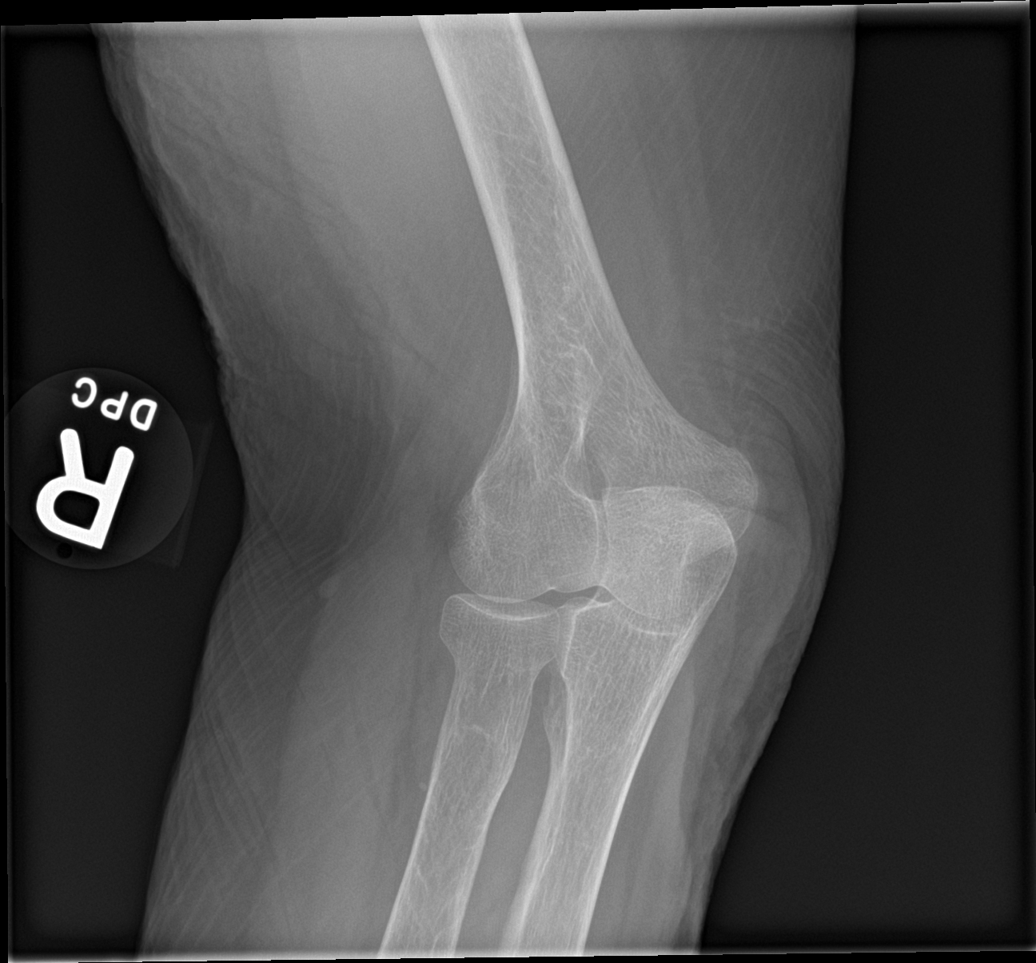

[elbow lat]
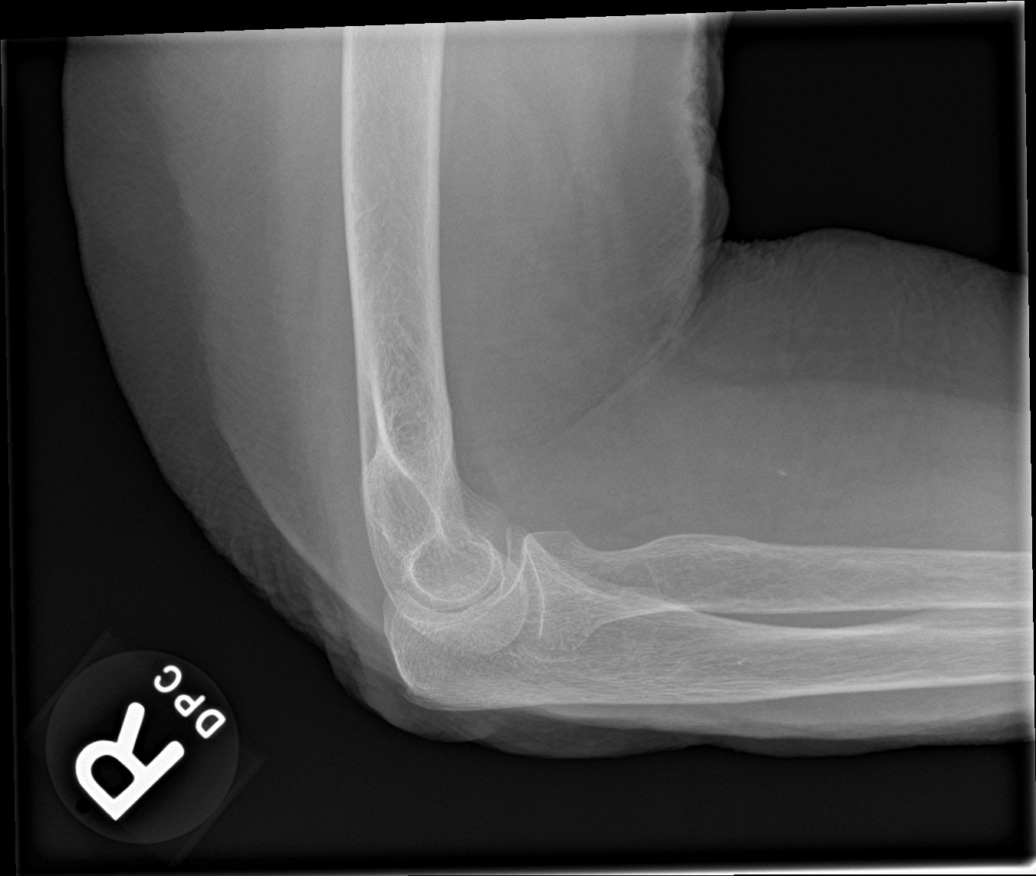

[elbow obl (2 of 2)]
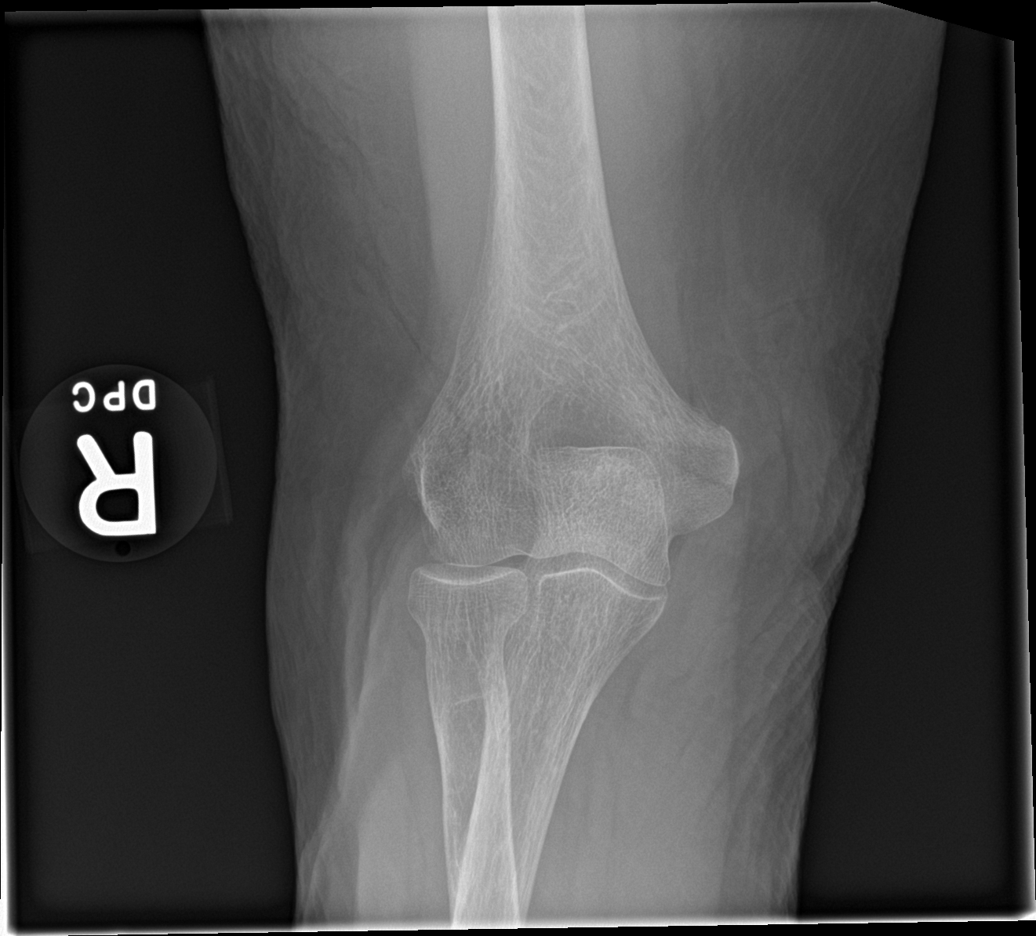

[4 of 4 positions shown; findings below may reference images not displayed]

FINDINGS: Frontal, lateral, and bilateral oblique views were obtained. There
is no evident fracture or dislocation. There is no appreciable joint
effusion. No erosive change or joint space narrowing. There is a
small olecranon process spur along the proximal ulna.
IMPRESSION: Small olecranon spur. No evident fracture or dislocation. No
appreciable joint space narrowing or erosion.

## 2017-04-16 IMAGING — DX DG HIP (WITH OR WITHOUT PELVIS) 2-3V*R*
3 series · 3 of 3 positions shown · non-contrast
Comparison: None.

CLINICAL DATA: Pain following fall

EXAM:
DG HIP (WITH OR WITHOUT PELVIS) 2-3V RIGHT

[pelvis ap]
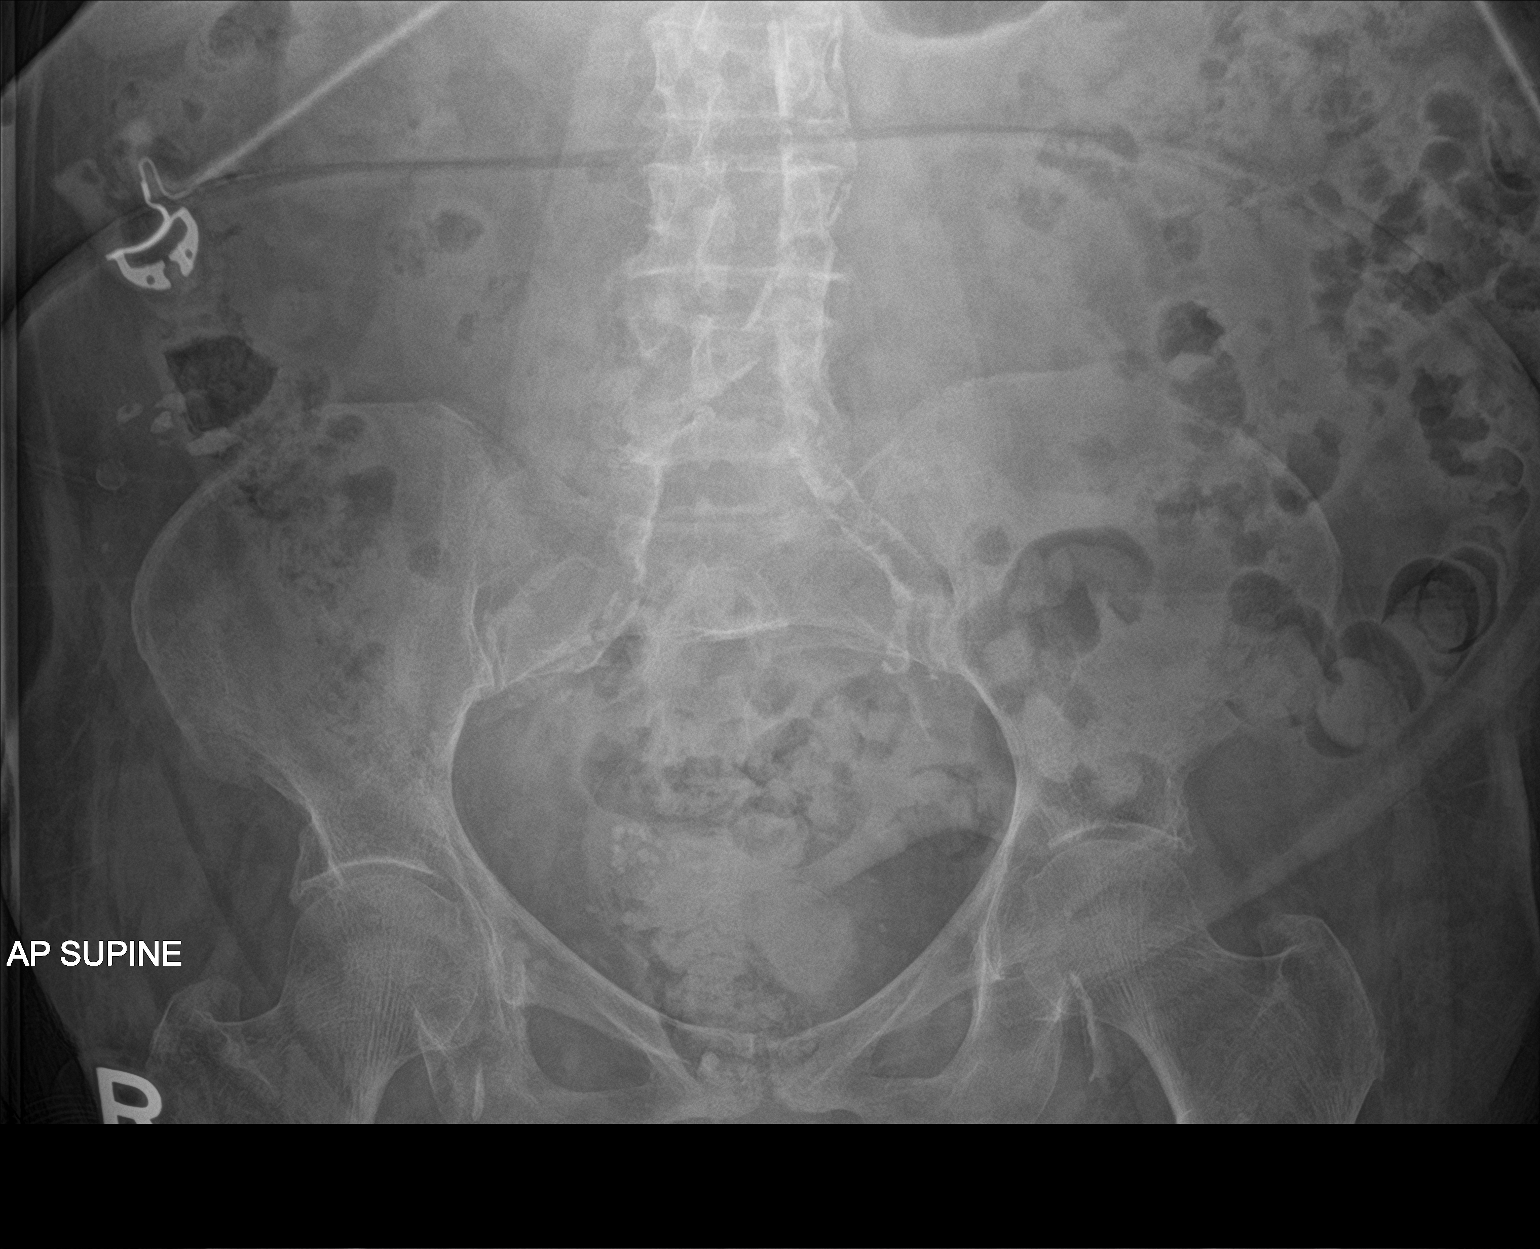

[hip ap]
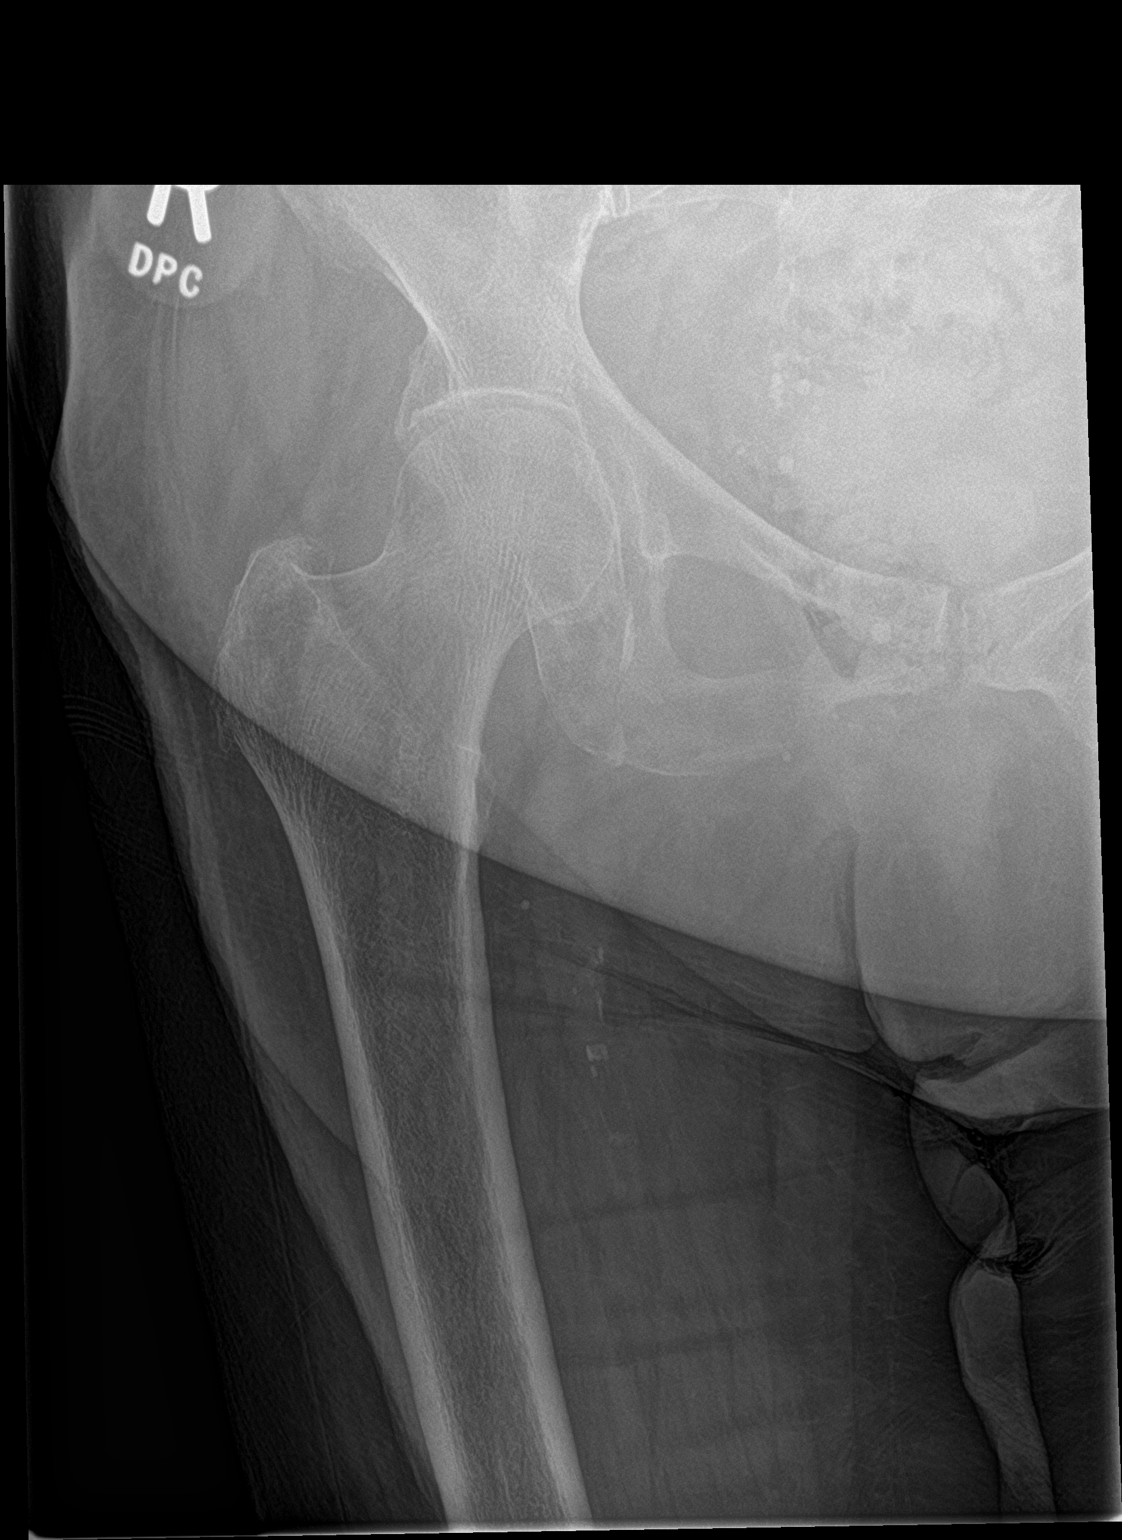

[hip lat]
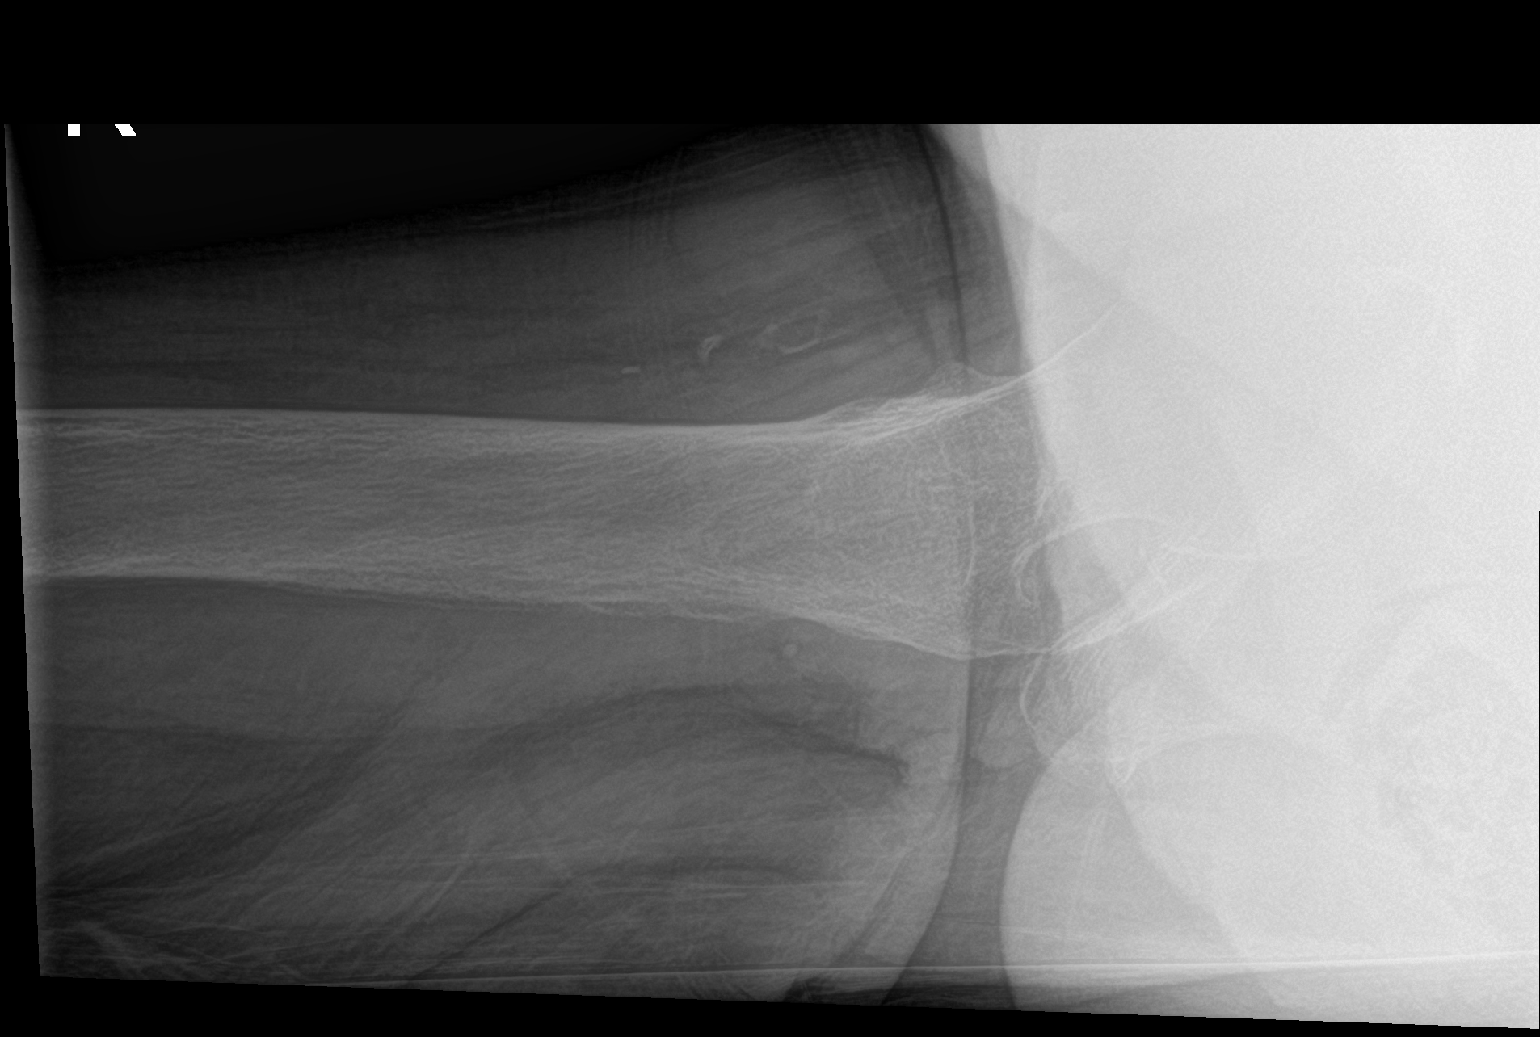

[3 of 3 positions shown; findings below may reference images not displayed]

FINDINGS: Frontal pelvis as well as frontal and lateral right hip images were
obtained. There is no appreciable fracture or dislocation. There is
mild narrowing of both hip joints. No erosive change. There is
extensive aorto bi-iliac atherosclerosis.
IMPRESSION: No fracture or dislocation. Symmetric narrowing of both hip joints.
Extensive aortoiliac atherosclerosis.

Aortic Atherosclerosis ([GL]-[GL]).

## 2017-04-16 MED ORDER — OXYCODONE-ACETAMINOPHEN 5-325 MG PO TABS
1.0000 | ORAL_TABLET | Freq: Once | ORAL | Status: AC
  Administered 2017-04-16: 1 via ORAL
  Filled 2017-04-16: qty 1

## 2017-04-16 NOTE — ED Provider Notes (Signed)
[facility] EMERGENCY DEPARTMENT Provider Note   CSN: [id number] Arrival date & time: 04/16/17  2234     History   Chief Complaint Chief Complaint  Patient presents with  . Fall    right knee pain    HPI [name] is a 76 y.o. female.  Patient brought to the emergency department for evaluation of injuries from a fall.  Patient reports that she was getting up to go take her nighttime medications when she fell.  This was a mechanical fall, no loss of consciousness.  She never had any dizziness, blurred vision, chest pain, shortness of breath, palpitations.  Patient reports that she fell onto her right side, complaining of right elbow and right leg pain.  She did not hit her head or lose consciousness.  She is not on any blood thinners.  She denies neck and back pain.      Past Medical History:  Diagnosis Date  . Anxiety disorder   . Cat allergies   . Cerebrovascular disease    CVA-left sided weakness  . Chronic kidney disease (CKD), stage III (moderate) ([place]) 08/04/2011  . Coronary atherosclerosis of unspecified type of vessel, native or graft    MI x2  . Depression   . Diabetes mellitus without complication ([place])   . Glaucoma   . Hemorrhoids   . Hyperlipidemia, mixed   . Kidney stones   . Polyclonal gammopathy 01/29/2012  . PVD (peripheral vascular disease) ([place])   . Unspecified essential hypertension     Patient Active Problem List   Diagnosis Date Noted  . History of colonic polyps   . Diverticulosis of colon without hemorrhage   . Hx of adenomatous colonic polyps 03/05/2015  . Polyclonal gammopathy 01/29/2012  . Chronic kidney disease (CKD), stage III (moderate) ([place]) 08/04/2011  . Kidney stones 08/04/2011  . Pyelonephritis, acute 08/02/2011  . Dehydration 08/02/2011  . Nausea 08/02/2011  . DM type 2, uncontrolled, with renal complications ([place]) 19/41/7408  . Incontinence 08/02/2011  . Obesity 08/02/2011  . ARF (acute renal failure) ([place])  08/02/2011  . CAROTID STENOSIS 09/20/2008  . CHEST PAIN 09/20/2008  . DM 09/19/2008  . HYPERLIPIDEMIA-MIXED 09/19/2008  . Essential hypertension 09/19/2008  . Coronary atherosclerosis 09/19/2008  . PVD 09/19/2008  . RENAL INSUFFICIENCY 09/19/2008  . CONTRAST DYE ALLERGY, HX OF 09/19/2008  . HEMORRHOIDS 09/12/2008  . Constipation 09/12/2008  . HEMATOCHEZIA 09/12/2008    Past Surgical History:  Procedure Laterality Date  . ABDOMINAL HYSTERECTOMY    . CHOLECYSTECTOMY    . COLONOSCOPY  09/26/08   XKG:YJEHUD rectum/diminutive rectosigmoid polyp/pan colonic diverticula  . COLONOSCOPY N/A 03/14/2015   Procedure: COLONOSCOPY;  Surgeon: Daneil Dolin, MD;  Location: [facility];  Service: Endoscopy;  Laterality: N/A;  1030 - moved to 10:15 - office to notify  . KIDNEY STONE SURGERY    . laparoectomy    . polyectomy    . TUBAL LIGATION      OB History    Gravida Para Term Preterm AB Living   3 2 2   1      SAB TAB Ectopic Multiple Live Births   1               Home Medications    Prior to Admission medications   Medication Sig Start Date End Date Taking? Authorizing Provider  acetaminophen (TYLENOL) 500 MG tablet Take 1,000 mg by mouth every 6 (six) hours as needed for mild pain or moderate pain.  Yes [provider]  amLODipine (NORVASC) 10 MG tablet Take 10 mg by mouth daily.     Yes [provider]  aspirin 81 MG tablet Take 81 mg by mouth every morning.    Yes [provider]  Cholecalciferol (VITAMIN D-3) 1000 units CAPS Take 2 capsules by mouth daily.    Yes [provider]  cyanocobalamin (,VITAMIN B-12,) 1000 MCG/ML injection Inject 1,000 mcg into the muscle every 30 (thirty) days.   Yes [provider]  diazepam (VALIUM) 10 MG tablet Take 10 mg by mouth every 6 (six) hours as needed. Takes 3-4 times per day as needed for being upset. Always takes 1 tablet at bedtime   Yes [provider]  docusate sodium (COLACE)  100 MG capsule Take 100 mg by mouth 2 (two) times daily.   Yes [provider]  furosemide (LASIX) 20 MG tablet Take 20 mg by mouth as needed. For decreasing your potassium level and for fluid retention. 08/05/11 04/16/17 Yes Rexene Alberts, MD  glipiZIDE (GLUCOTROL XL) 10 MG 24 hr tablet Take 10 mg by mouth 2 (two) times daily.    Yes [provider]  hydrALAZINE (APRESOLINE) 25 MG tablet Take 25 mg by mouth 3 (three) times daily.   Yes [provider]  insulin glargine (LANTUS) 100 UNIT/ML injection Inject 20-50 Units into the skin at bedtime. Adjusts amount based on her blood sugar levels   Yes [provider]  isosorbide mononitrate (IMDUR) 30 MG 24 hr tablet Take 30 mg by mouth daily.    Yes [provider]  losartan (COZAAR) 100 MG tablet Take 100 mg by mouth daily.   Yes [provider]  Melatonin 3 MG TABS Take 3 mg by mouth at bedtime as needed (sleep).    Yes [provider]  metoprolol tartrate (LOPRESSOR) 50 MG tablet Take 50 mg by mouth 2 (two) times daily.   Yes [provider]  nitroGLYCERIN (NITROSTAT) 0.4 MG SL tablet Place 0.4 mg under the tongue every 5 (five) minutes x 3 doses as needed (last dose 1 week ago 03/05/2015). For chest pain   Yes [provider]  OVER THE COUNTER MEDICATION Take 1 capsule by mouth daily. MEGA RED KRILL OIL   Yes [provider]  rosuvastatin (CRESTOR) 40 MG tablet TAKE ONE TABLET BY MOUTH AT BEDTIME. 03/10/17  Yes Crenshaw, Denice Bors, MD  oxyCODONE-acetaminophen (PERCOCET) 5-325 MG tablet Take 1 tablet by mouth every 6 (six) hours as needed. 04/17/17   Orpah Greek, MD    Family History Family History  Problem Relation Age of Onset  . Heart attack Father   . Kidney disease Mother   . Colon cancer Other        aunt  . Colon polyps Son   . Colon polyps Other        siblings    Social History Social History   Tobacco Use  . Smoking status: Former  Research scientist (life sciences)  . Smokeless tobacco: Never Used  Substance Use Topics  . Alcohol use: No  . Drug use: No     Allergies   Contrast media [iodinated diagnostic agents]; Hydromorphone hcl; Biaxin [clarithromycin]; Cephalexin; Penicillins; and Sulfonamide derivatives   Review of Systems Review of Systems  Musculoskeletal: Positive for arthralgias.  All other systems reviewed and are negative.    Physical Exam Updated Vital Signs BP (!) 137/53   Pulse 77   Resp 18   Ht 5' (1.524 m)  Wt 72.6 kg (160 lb)   SpO2 91%   BMI 31.25 kg/m   Physical Exam  Constitutional: She is oriented to person, place, and time. She appears well-developed and well-nourished. No distress.  HENT:  Head: Normocephalic and atraumatic.  Right Ear: Hearing normal.  Left Ear: Hearing normal.  Nose: Nose normal.  Mouth/Throat: Oropharynx is clear and moist and mucous membranes are normal.  Eyes: Conjunctivae and EOM are normal. Pupils are equal, round, and reactive to light.  Neck: Normal range of motion. Neck supple. No spinous process tenderness and no muscular tenderness present.  Cardiovascular: Regular rhythm, S1 normal and S2 normal. Exam reveals no gallop and no friction rub.  No murmur heard. Pulmonary/Chest: Effort normal and breath sounds normal. No respiratory distress. She exhibits no tenderness.  Abdominal: Soft. Normal appearance and bowel sounds are normal. There is no hepatosplenomegaly. There is no tenderness. There is no rebound, no guarding, no tenderness at McBurney's point and negative Murphy's sign. No hernia.  Musculoskeletal:       Right elbow: She exhibits normal range of motion, no swelling and no deformity.       Right hip: She exhibits tenderness. She exhibits normal range of motion.       Right knee: She exhibits decreased range of motion. She exhibits no swelling, no effusion, no ecchymosis and no deformity. Tenderness found.       Right ankle: She exhibits normal range of motion  and no swelling. Tenderness.       Cervical back: Normal.       Thoracic back: Normal.       Lumbar back: Normal.       Arms: Neurological: She is alert and oriented to person, place, and time. She has normal strength. No cranial nerve deficit or sensory deficit. Coordination normal. GCS eye subscore is 4. GCS verbal subscore is 5. GCS motor subscore is 6.  Skin: Skin is warm, dry and intact. No rash noted. No cyanosis.  Psychiatric: She has a normal mood and affect. Her speech is normal and behavior is normal. Thought content normal.  Nursing note and vitals reviewed.    ED Treatments / Results  Labs (all labs ordered are listed, but only abnormal results are displayed) Labs Reviewed  CBG MONITORING, ED - Abnormal; Notable for the following components:      Result Value   Glucose-Capillary 189 (*)    All other components within normal limits    EKG  EKG Interpretation  Date/Time:  Thursday April 16 2017 22:50:26 EST Ventricular Rate:  75 PR Interval:    QRS Duration: 112 QT Interval:  410 QTC Calculation: 458 R Axis:   67 Text Interpretation:  Sinus rhythm Borderline intraventricular conduction delay Borderline low voltage, extremity leads Confirmed by Orpah Greek [phone]) on 04/17/2017 12:24:49 AM       Radiology Dg Elbow Complete Right  Result Date: 04/17/2017 CLINICAL DATA:  Pain following fall EXAM: RIGHT ELBOW - COMPLETE 3+ VIEW COMPARISON:  None. FINDINGS: Frontal, lateral, and bilateral oblique views were obtained. There is no evident fracture or dislocation. There is no appreciable joint effusion. No erosive change or joint space narrowing. There is a small olecranon process spur along the proximal ulna. IMPRESSION: Small olecranon spur. No evident fracture or dislocation. No appreciable joint space narrowing or erosion. Electronically Signed   By: Lowella Grip III M.D.   On: 04/17/2017 00:15   Dg Ankle Complete Right  Result Date:  04/17/2017 CLINICAL DATA:  Pain following fall EXAM: RIGHT ANKLE - COMPLETE 3+ VIEW COMPARISON:  None. FINDINGS: Frontal, oblique, and lateral views were obtained. There is evidence of old trauma involving the distal tibia with remodeling. No acute fracture evident. No joint effusion. There is mild narrowing in the medial ankle joint region. Ankle mortise appears intact. Bones are osteoporotic. IMPRESSION: Old trauma with remodeling distal tibia. No acute fracture evident. Bones osteoporotic. Ankle mortise appears intact. There is osteoarthritic change in the medial portion of the ankle joint. Electronically Signed   By: Lowella Grip III M.D.   On: 04/17/2017 00:20   Dg Knee Complete 4 Views Right  Result Date: 04/17/2017 CLINICAL DATA:  Pain following fall EXAM: RIGHT KNEE - COMPLETE 4+ VIEW COMPARISON:  None. FINDINGS: Frontal, lateral, and bilateral oblique views were obtained. There is a nondisplaced fracture at the junction of the proximal and mid thirds of the patella. There is a large joint effusion. No other fracture. No dislocation. There is moderate narrowing medially. IMPRESSION: Nondisplaced fracture of the proximal patella with large joint effusion. No dislocation. Moderate joint space narrowing medially. Electronically Signed   By: Lowella Grip III M.D.   On: 04/17/2017 00:19   Dg Hip Unilat W Or Wo Pelvis 2-3 Views Right  Result Date: 04/17/2017 CLINICAL DATA:  Pain following fall EXAM: DG HIP (WITH OR WITHOUT PELVIS) 2-3V RIGHT COMPARISON:  None. FINDINGS: Frontal pelvis as well as frontal and lateral right hip images were obtained. There is no appreciable fracture or dislocation. There is mild narrowing of both hip joints. No erosive change. There is extensive aorto bi-iliac atherosclerosis. IMPRESSION: No fracture or dislocation. Symmetric narrowing of both hip joints. Extensive aortoiliac atherosclerosis. Aortic Atherosclerosis (ICD10-I70.0). Electronically Signed   By:  Lowella Grip III M.D.   On: 04/17/2017 00:17    Procedures Procedures (including critical care time)  Medications Ordered in ED Medications  oxyCODONE-acetaminophen (PERCOCET/ROXICET) 5-325 MG per tablet 1 tablet (1 tablet Oral Given 04/16/17 2315)     Initial Impression / Assessment and Plan / ED Course  I have reviewed the triage vital signs and the nursing notes.  Pertinent labs & imaging results that were available during my care of the patient were reviewed by me and considered in my medical decision making (see chart for details).     Patient presents to the emergency department for evaluation after a fall.  It sounds like she either fell getting out of bed or slid out of bed to cause the fall.  She was trying to get up to get her medications.  She was unable to bear any weight on her right leg.  Her main complaint is knee pain, but she had some complain of the entire leg hurting as well as her right elbow.  No evidence of head injury, patient awake, alert, oriented with normal neurologic function.  She does not have neck or back pain or tenderness.  X-ray of elbow, ankle, hip negative, x-ray of knee does confirm patella fracture.  Will place a knee immobilizer, provided analgesia and have him follow-up as an outpatient with orthopedics.  Final Clinical Impressions(s) / ED Diagnoses   Final diagnoses:  Closed nondisplaced fracture of right patella, unspecified fracture morphology, initial encounter    ED Discharge Orders        Ordered    oxyCODONE-acetaminophen (PERCOCET) 5-325 MG tablet  Every 6 hours PRN     04/17/17 0035       Orpah Greek, MD 04/17/17 [phone]

## 2017-04-16 NOTE — ED Triage Notes (Addendum)
EMS called out for fall. Pt had been assisted off floor by spouse and was in [facility]. EMS reports pt was unable to bear weight on right leg D/t knee pain. Pt also c/o right arm pain. No LOC, did not hit head. Pt fell while getting out of bed.

## 2017-04-17 DIAGNOSIS — Z743 Need for continuous supervision: Secondary | ICD-10-CM | POA: Diagnosis not present

## 2017-04-17 DIAGNOSIS — M25571 Pain in right ankle and joints of right foot: Secondary | ICD-10-CM | POA: Diagnosis not present

## 2017-04-17 DIAGNOSIS — S59901A Unspecified injury of right elbow, initial encounter: Secondary | ICD-10-CM | POA: Diagnosis not present

## 2017-04-17 DIAGNOSIS — S79911A Unspecified injury of right hip, initial encounter: Secondary | ICD-10-CM | POA: Diagnosis not present

## 2017-04-17 DIAGNOSIS — M25521 Pain in right elbow: Secondary | ICD-10-CM | POA: Diagnosis not present

## 2017-04-17 DIAGNOSIS — S99911A Unspecified injury of right ankle, initial encounter: Secondary | ICD-10-CM | POA: Diagnosis not present

## 2017-04-17 DIAGNOSIS — R279 Unspecified lack of coordination: Secondary | ICD-10-CM | POA: Diagnosis not present

## 2017-04-17 DIAGNOSIS — S82044A Nondisplaced comminuted fracture of right patella, initial encounter for closed fracture: Secondary | ICD-10-CM | POA: Diagnosis not present

## 2017-04-17 DIAGNOSIS — M25551 Pain in right hip: Secondary | ICD-10-CM | POA: Diagnosis not present

## 2017-04-17 MED ORDER — OXYCODONE-ACETAMINOPHEN 5-325 MG PO TABS: 1.0000 | ORAL_TABLET | Freq: Four times a day (QID) | ORAL | 0 refills | Status: DC | PRN

## 2017-04-17 MED ORDER — ONDANSETRON 8 MG PO TBDP
8.0000 mg | ORAL_TABLET | Freq: Once | ORAL | Status: AC
  Administered 2017-04-17: 8 mg via ORAL
  Filled 2017-04-17: qty 1

## 2017-04-17 MED ORDER — MORPHINE SULFATE (PF) 4 MG/ML IV SOLN
4.0000 mg | Freq: Once | INTRAVENOUS | Status: AC
  Administered 2017-04-17: 4 mg via INTRAMUSCULAR
  Filled 2017-04-17: qty 1

## 2017-04-17 NOTE — Discharge Instructions (Signed)
Keep the immobilizer in place at all times and schedule follow-up with Dr. Aline Brochure, orthopedics, for as soon as possible.

## 2017-04-24 DIAGNOSIS — S82001D Unspecified fracture of right patella, subsequent encounter for closed fracture with routine healing: Secondary | ICD-10-CM | POA: Diagnosis not present

## 2017-05-01 ENCOUNTER — Ambulatory Visit: Payer: PPO | Admitting: Orthopedic Surgery

## 2017-05-22 DIAGNOSIS — S82001D Unspecified fracture of right patella, subsequent encounter for closed fracture with routine healing: Secondary | ICD-10-CM | POA: Diagnosis not present

## 2017-05-29 DIAGNOSIS — I10 Essential (primary) hypertension: Secondary | ICD-10-CM | POA: Diagnosis not present

## 2017-05-29 DIAGNOSIS — Z79891 Long term (current) use of opiate analgesic: Secondary | ICD-10-CM | POA: Diagnosis not present

## 2017-05-29 DIAGNOSIS — Z9181 History of falling: Secondary | ICD-10-CM | POA: Diagnosis not present

## 2017-05-29 DIAGNOSIS — E119 Type 2 diabetes mellitus without complications: Secondary | ICD-10-CM | POA: Diagnosis not present

## 2017-05-29 DIAGNOSIS — S82042D Displaced comminuted fracture of left patella, subsequent encounter for closed fracture with routine healing: Secondary | ICD-10-CM | POA: Diagnosis not present

## 2017-05-29 DIAGNOSIS — Z794 Long term (current) use of insulin: Secondary | ICD-10-CM | POA: Diagnosis not present

## 2017-05-29 DIAGNOSIS — Z7982 Long term (current) use of aspirin: Secondary | ICD-10-CM | POA: Diagnosis not present

## 2017-06-19 DIAGNOSIS — S82001D Unspecified fracture of right patella, subsequent encounter for closed fracture with routine healing: Secondary | ICD-10-CM | POA: Diagnosis not present

## 2017-07-13 DIAGNOSIS — E538 Deficiency of other specified B group vitamins: Secondary | ICD-10-CM | POA: Diagnosis not present

## 2017-08-26 DIAGNOSIS — E538 Deficiency of other specified B group vitamins: Secondary | ICD-10-CM | POA: Diagnosis not present

## 2017-09-22 DIAGNOSIS — E538 Deficiency of other specified B group vitamins: Secondary | ICD-10-CM | POA: Diagnosis not present

## 2017-09-22 DIAGNOSIS — N183 Chronic kidney disease, stage 3 (moderate): Secondary | ICD-10-CM | POA: Diagnosis not present

## 2017-09-22 DIAGNOSIS — I700 Atherosclerosis of aorta: Secondary | ICD-10-CM | POA: Diagnosis not present

## 2017-09-22 DIAGNOSIS — E1165 Type 2 diabetes mellitus with hyperglycemia: Secondary | ICD-10-CM | POA: Diagnosis not present

## 2017-09-22 DIAGNOSIS — E782 Mixed hyperlipidemia: Secondary | ICD-10-CM | POA: Diagnosis not present

## 2017-09-22 DIAGNOSIS — Z1389 Encounter for screening for other disorder: Secondary | ICD-10-CM | POA: Diagnosis not present

## 2017-09-22 DIAGNOSIS — Z6828 Body mass index (BMI) 28.0-28.9, adult: Secondary | ICD-10-CM | POA: Diagnosis not present

## 2017-09-22 DIAGNOSIS — M1991 Primary osteoarthritis, unspecified site: Secondary | ICD-10-CM | POA: Diagnosis not present

## 2017-09-22 DIAGNOSIS — R201 Hypoesthesia of skin: Secondary | ICD-10-CM | POA: Diagnosis not present

## 2017-09-22 DIAGNOSIS — I10 Essential (primary) hypertension: Secondary | ICD-10-CM | POA: Diagnosis not present

## 2017-09-22 DIAGNOSIS — E663 Overweight: Secondary | ICD-10-CM | POA: Diagnosis not present

## 2017-10-21 DIAGNOSIS — E538 Deficiency of other specified B group vitamins: Secondary | ICD-10-CM | POA: Diagnosis not present

## 2017-10-26 ENCOUNTER — Ambulatory Visit (INDEPENDENT_AMBULATORY_CARE_PROVIDER_SITE_OTHER): Payer: PPO

## 2017-10-26 ENCOUNTER — Encounter: Payer: Self-pay | Admitting: Orthopedic Surgery

## 2017-10-26 ENCOUNTER — Ambulatory Visit: Payer: PPO | Admitting: Orthopedic Surgery

## 2017-10-26 VITALS — BP 152/63 | HR 68 | Ht 61.0 in

## 2017-10-26 DIAGNOSIS — G8929 Other chronic pain: Secondary | ICD-10-CM | POA: Diagnosis not present

## 2017-10-26 DIAGNOSIS — M5441 Lumbago with sciatica, right side: Secondary | ICD-10-CM

## 2017-10-26 DIAGNOSIS — M25561 Pain in right knee: Secondary | ICD-10-CM | POA: Diagnosis not present

## 2017-10-26 MED ORDER — GABAPENTIN 100 MG PO CAPS: 100.0000 mg | ORAL_CAPSULE | Freq: Three times a day (TID) | ORAL | 2 refills | Status: AC

## 2017-10-26 NOTE — Progress Notes (Signed)
NEW PATIENT OFFICE VISI  Chief Complaint  Patient presents with  . Knee Pain    right patella fracture 04/16/17 treated by Dr French Ana, has been released but still has pain     77 year old female presents for evaluation of ongoing pain in her right leg  In December she had a nondisplaced patella fracture treated by Dr. French Ana with knee immobilizer followed by hinged knee brace and then physical therapy.  Some point during the course of treatment her right leg started hurting she started having increasing aching lower back pain and difficulty walking which she is coming in complaining of now.  She does have some residual knee pain across the patella and inferior portion of the patellar tendon but her main problem seems to be weakness of the right leg trouble walking and right leg pain and she has not been treated for this specifically.  She did have physical therapy for the knee never really regained her walking ability as she had prior to the injury   Review of Systems  Constitutional: Negative for malaise/fatigue.  Musculoskeletal: Positive for back pain.  Skin: Negative.   Neurological: Positive for tingling, sensory change and weakness. Negative for tremors.     Past Medical History:  Diagnosis Date  . Anxiety disorder   . Cat allergies   . Cerebrovascular disease    CVA-left sided weakness  . Chronic kidney disease (CKD), stage III (moderate) ([place]) 08/04/2011  . Coronary atherosclerosis of unspecified type of vessel, native or graft    MI x2  . Depression   . Diabetes mellitus without complication ([place])   . Glaucoma   . Hemorrhoids   . Hyperlipidemia, mixed   . Kidney stones   . Polyclonal gammopathy 01/29/2012  . PVD (peripheral vascular disease) ([place])   . Unspecified essential hypertension     Past Surgical History:  Procedure Laterality Date  . ABDOMINAL HYSTERECTOMY    . CHOLECYSTECTOMY    . COLONOSCOPY  09/26/08   KPT:[id number] rectum/diminutive rectosigmoid  polyp/pan colonic diverticula  . COLONOSCOPY N/A 03/14/2015   Procedure: COLONOSCOPY;  Surgeon: Daneil Dolin, MD;  Location: [facility];  Service: Endoscopy;  Laterality: N/A;  1030 - moved to 10:15 - office to notify  . KIDNEY STONE SURGERY    . laparoectomy    . polyectomy    . TUBAL LIGATION      Family History  Problem Relation Age of Onset  . Heart attack Father   . Kidney disease Mother   . Colon cancer Other        aunt  . Colon polyps Son   . Colon polyps Other        siblings   Social History   Tobacco Use  . Smoking status: Former Research scientist (life sciences)  . Smokeless tobacco: Never Used  Substance Use Topics  . Alcohol use: No  . Drug use: No      Current Meds  Medication Sig  . acetaminophen (TYLENOL) 500 MG tablet Take 1,000 mg by mouth every 6 (six) hours as needed for mild pain or moderate pain.  Marland Kitchen amLODipine (NORVASC) 10 MG tablet Take 10 mg by mouth daily.    Marland Kitchen aspirin 81 MG tablet Take 81 mg by mouth every morning.   . Cholecalciferol (VITAMIN D-3) 1000 units CAPS Take 2 capsules by mouth daily.   . cyanocobalamin (,VITAMIN B-12,) 1000 MCG/ML injection Inject 1,000 mcg into the muscle every 30 (thirty) days.  . diazepam (VALIUM) 10 MG tablet Take 10  mg by mouth every 6 (six) hours as needed. Takes 3-4 times per day as needed for being upset. Always takes 1 tablet at bedtime  . docusate sodium (COLACE) 100 MG capsule Take 100 mg by mouth 2 (two) times daily.  Marland Kitchen glipiZIDE (GLUCOTROL XL) 10 MG 24 hr tablet Take 10 mg by mouth 2 (two) times daily.   . hydrALAZINE (APRESOLINE) 25 MG tablet Take 25 mg by mouth 3 (three) times daily.  . insulin glargine (LANTUS) 100 UNIT/ML injection Inject 20-50 Units into the skin at bedtime. Adjusts amount based on her blood sugar levels  . isosorbide mononitrate (IMDUR) 30 MG 24 hr tablet Take 30 mg by mouth daily.   Marland Kitchen losartan (COZAAR) 100 MG tablet Take 100 mg by mouth daily.  . Melatonin 3 MG TABS Take 3 mg by mouth at bedtime as  needed (sleep).   . metoprolol tartrate (LOPRESSOR) 50 MG tablet Take 50 mg by mouth 2 (two) times daily.  . nitroGLYCERIN (NITROSTAT) 0.4 MG SL tablet Place 0.4 mg under the tongue every 5 (five) minutes x 3 doses as needed (last dose 1 week ago 03/05/2015). For chest pain  . OVER THE COUNTER MEDICATION Take 1 capsule by mouth daily. MEGA RED KRILL OIL  . rosuvastatin (CRESTOR) 40 MG tablet TAKE ONE TABLET BY MOUTH AT BEDTIME.    BP (!) 152/63   Pulse 68   Ht 5\' 1"  (1.549 m)   BMI 30.23 kg/m   Physical Exam  Constitutional: She is oriented to person, place, and time. She appears well-developed and well-nourished.  Neurological: She is alert and oriented to person, place, and time.  Psychiatric: She has a normal mood and affect. Judgment normal.  Vitals reviewed.   Ortho Exam  Left leg motor function normal, no alignment issues.  No instability.  Alignment normal.  Range of motion hip and knee normal.  Distally she has normal sensation good pulse normal reflexes.  Right leg mild range of motion loss at the right knee in terms of flexion hip motion is normal no weakness.  Alignment is normal.  There is no instability.  The sensation and pulse are normal reflexes are normal she had mild pain with straight leg raise in the seated position  She has tenderness across the lower back and also the right hip and gluteal area  Zoar were done at [facility] they did a hip which included a pelvis [facility] lateral right hip which was normal they did a knee which showed a superior patella fracture nondisplaced  We repeated that x-ray today the fracture has healed she has osteopenia and arthritis of the medial Compartment.   Encounter Diagnoses  Name Primary?  . Chronic pain of right knee   . Acute right-sided low back pain with right-sided sciatica Yes    PLAN: (Rx., injectx, surgery, frx, mri/ct) Recommend physical therapy for the lumbar spine  Start  gabapentin 100 mg 3 times a day follow-up in 2 months  No orders of the defined types were placed in this encounter.   Arther Abbott, MD  10/26/2017 2:45 PM

## 2017-10-27 ENCOUNTER — Other Ambulatory Visit (HOSPITAL_COMMUNITY): Payer: Self-pay | Admitting: Physician Assistant

## 2017-10-27 DIAGNOSIS — E2839 Other primary ovarian failure: Secondary | ICD-10-CM

## 2017-11-12 ENCOUNTER — Telehealth: Payer: Self-pay | Admitting: Orthopedic Surgery

## 2017-11-12 NOTE — Telephone Encounter (Signed)
I sent the referral to [facility] I have sent a message over to [facility] to check on this for [place].

## 2017-11-12 NOTE — Telephone Encounter (Signed)
Patient called to inquire about [facility] referral of 10/27/17; states "no one has called her."  I verified her phone number and asked if she has voice mail set up, and she states she does have an answer machine. Please advise.

## 2017-11-18 ENCOUNTER — Other Ambulatory Visit (HOSPITAL_COMMUNITY): Payer: Self-pay | Admitting: Physician Assistant

## 2017-11-18 DIAGNOSIS — Z1231 Encounter for screening mammogram for malignant neoplasm of breast: Secondary | ICD-10-CM

## 2017-11-19 ENCOUNTER — Ambulatory Visit (HOSPITAL_COMMUNITY)
Admission: RE | Admit: 2017-11-19 | Discharge: 2017-11-19 | Disposition: A | Discharge status: Home / Self Care | Payer: PPO | Source: Ambulatory Visit | Attending: Physician Assistant | Admitting: Physician Assistant

## 2017-11-19 ENCOUNTER — Telehealth: Payer: Self-pay | Admitting: Orthopedic Surgery

## 2017-11-19 DIAGNOSIS — E1151 Type 2 diabetes mellitus with diabetic peripheral angiopathy without gangrene: Secondary | ICD-10-CM | POA: Diagnosis not present

## 2017-11-19 DIAGNOSIS — Z6828 Body mass index (BMI) 28.0-28.9, adult: Secondary | ICD-10-CM | POA: Diagnosis not present

## 2017-11-19 DIAGNOSIS — Z1389 Encounter for screening for other disorder: Secondary | ICD-10-CM | POA: Diagnosis not present

## 2017-11-19 DIAGNOSIS — M8588 Other specified disorders of bone density and structure, other site: Secondary | ICD-10-CM | POA: Diagnosis not present

## 2017-11-19 DIAGNOSIS — Z1231 Encounter for screening mammogram for malignant neoplasm of breast: Secondary | ICD-10-CM

## 2017-11-19 DIAGNOSIS — E663 Overweight: Secondary | ICD-10-CM | POA: Diagnosis not present

## 2017-11-19 DIAGNOSIS — E2839 Other primary ovarian failure: Secondary | ICD-10-CM | POA: Diagnosis not present

## 2017-11-19 DIAGNOSIS — E1165 Type 2 diabetes mellitus with hyperglycemia: Secondary | ICD-10-CM | POA: Diagnosis not present

## 2017-11-19 DIAGNOSIS — Z780 Asymptomatic menopausal state: Secondary | ICD-10-CM | POA: Diagnosis not present

## 2017-11-19 DIAGNOSIS — M810 Age-related osteoporosis without current pathological fracture: Secondary | ICD-10-CM | POA: Insufficient documentation

## 2017-11-19 NOTE — Telephone Encounter (Signed)
Magda Paganini with [facility] called to let Dr. Aline Brochure know that they saw the patient yesterday(7/24) and she was hurting real bad. Her family stated they was going to get her in to see Dr. Carloyn Manner for her back.  If you have any questions please call Magda Paganini at [phone]

## 2017-11-19 NOTE — Telephone Encounter (Signed)
Sounds good to you FYI

## 2017-11-24 ENCOUNTER — Other Ambulatory Visit (HOSPITAL_COMMUNITY): Payer: Self-pay | Admitting: Physician Assistant

## 2017-11-24 DIAGNOSIS — R928 Other abnormal and inconclusive findings on diagnostic imaging of breast: Secondary | ICD-10-CM

## 2017-11-30 ENCOUNTER — Telehealth: Payer: Self-pay | Admitting: Orthopedic Surgery

## 2017-11-30 DIAGNOSIS — D510 Vitamin B12 deficiency anemia due to intrinsic factor deficiency: Secondary | ICD-10-CM | POA: Diagnosis not present

## 2017-11-30 DIAGNOSIS — M5441 Lumbago with sciatica, right side: Secondary | ICD-10-CM

## 2017-11-30 NOTE — Telephone Encounter (Signed)
Patient's son came in the office this morning asking for Dr. Aline Brochure to please send a referral to Dr. Glenna Fellows for his mom. He stated that Dr. Carloyn Manner would do all the tests needed when he sees the patient.  Please call and advise. If no answer it is okay to leave a message. [phone]

## 2017-12-01 ENCOUNTER — Ambulatory Visit (HOSPITAL_COMMUNITY)
Admission: RE | Admit: 2017-12-01 | Discharge: 2017-12-01 | Disposition: A | Discharge status: Home / Self Care | Payer: PPO | Source: Ambulatory Visit | Attending: Physician Assistant | Admitting: Physician Assistant

## 2017-12-01 DIAGNOSIS — R928 Other abnormal and inconclusive findings on diagnostic imaging of breast: Secondary | ICD-10-CM | POA: Diagnosis not present

## 2017-12-01 DIAGNOSIS — N6011 Diffuse cystic mastopathy of right breast: Secondary | ICD-10-CM | POA: Diagnosis not present

## 2017-12-01 IMAGING — US US BREAST*R* LIMITED INC AXILLA
1 series · 4 of 4 positions shown · non-contrast
Comparison: Previous exam(s).

CLINICAL DATA: 77-year-old female recalled from screening mammogram
dated [DATE] for a right breast asymmetry. Patient had remote
note, the patient states she had 2 significant falls approximately 1
year to 6 months ago which resulted in significant bruising of her
right breast. This has since resolved in the interim.

EXAM:
DIGITAL DIAGNOSTIC RIGHT MAMMOGRAM WITH CAD AND TOMO
ULTRASOUND RIGHT BREAST

[Series 1: us breast*right* limited inc axilla · 0.05mm/px · 4 of 4 slices shown]
[im 1/4]
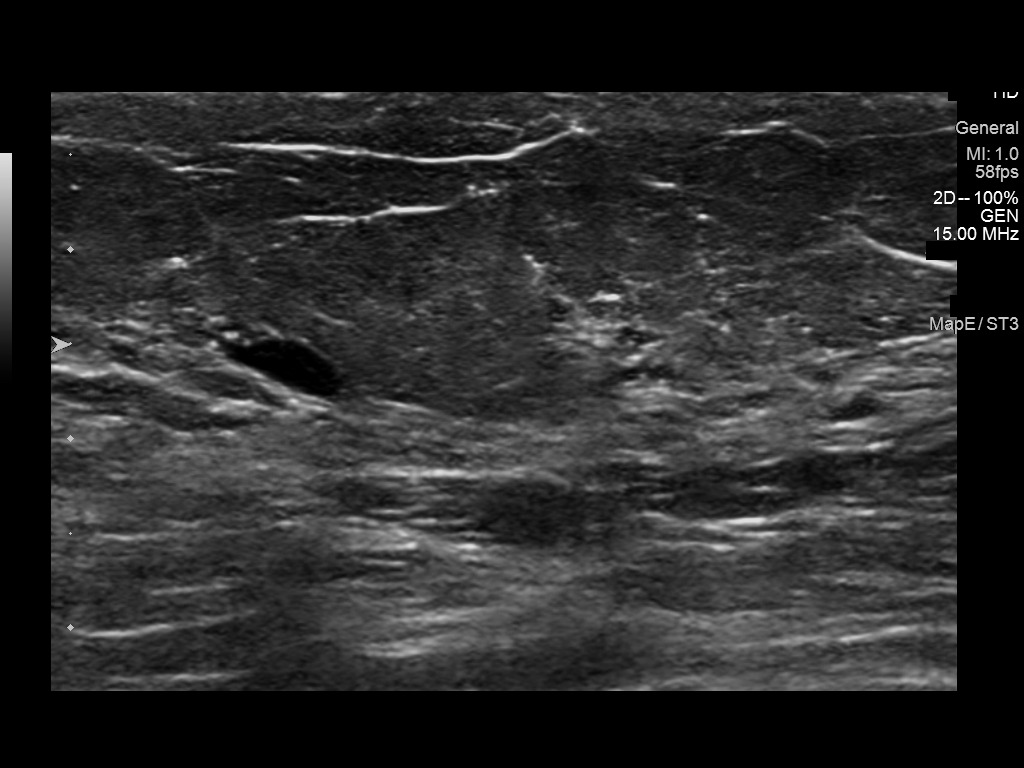
[im 2/4]
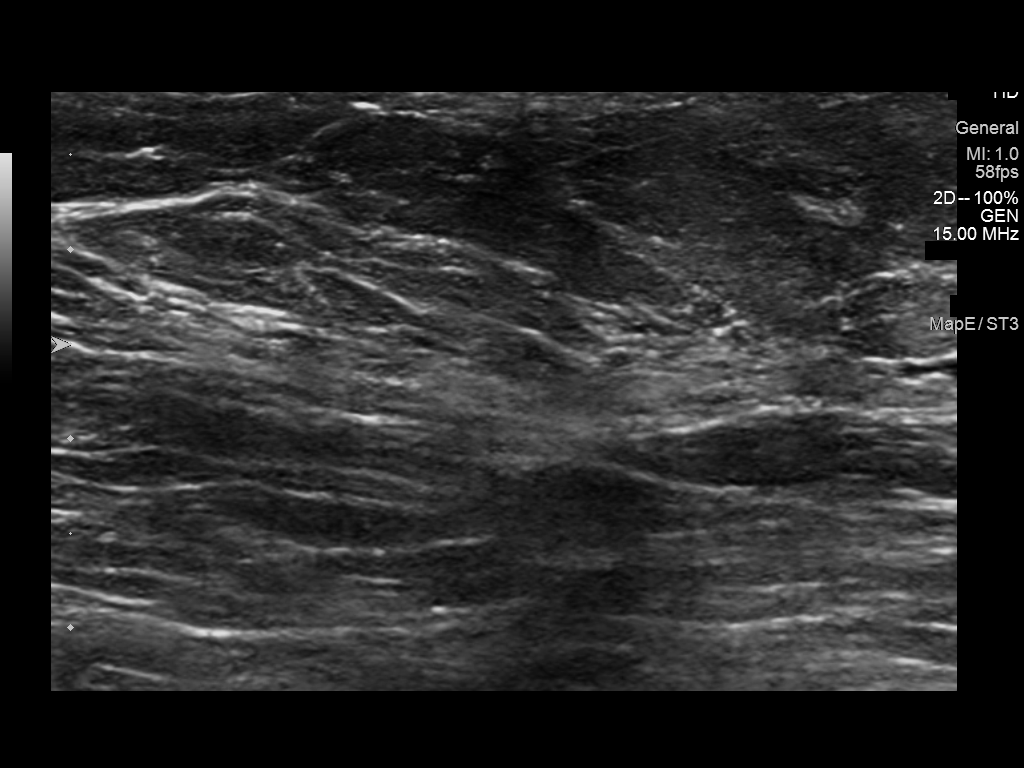
[im 3/4]
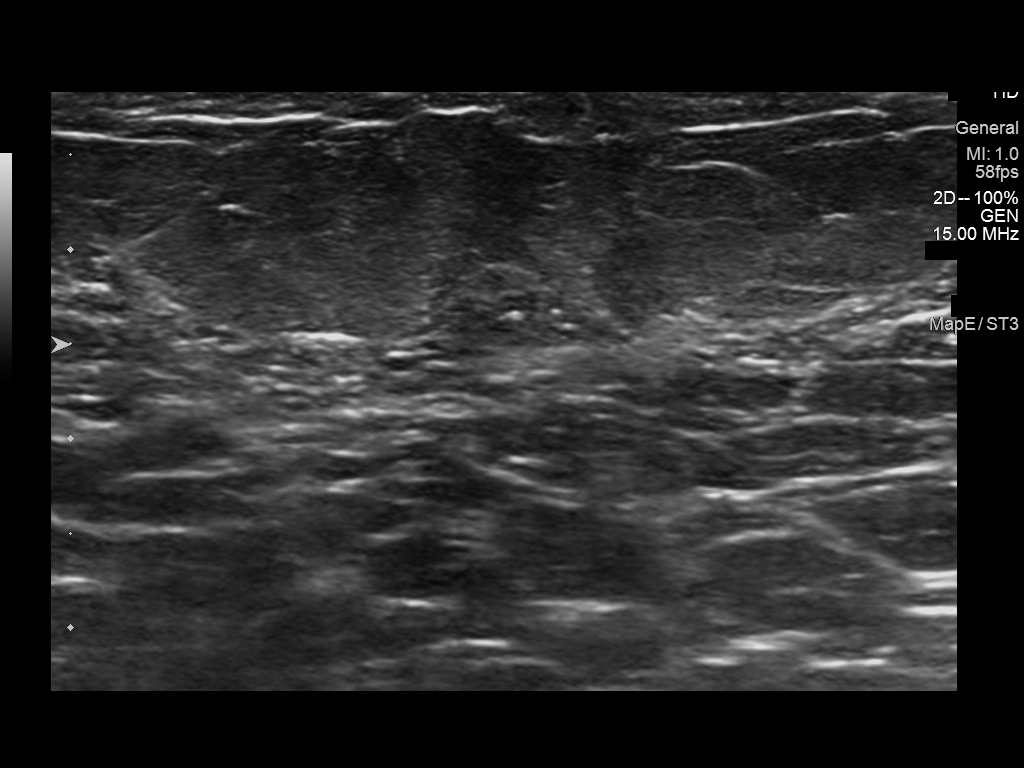
[im 4/4]
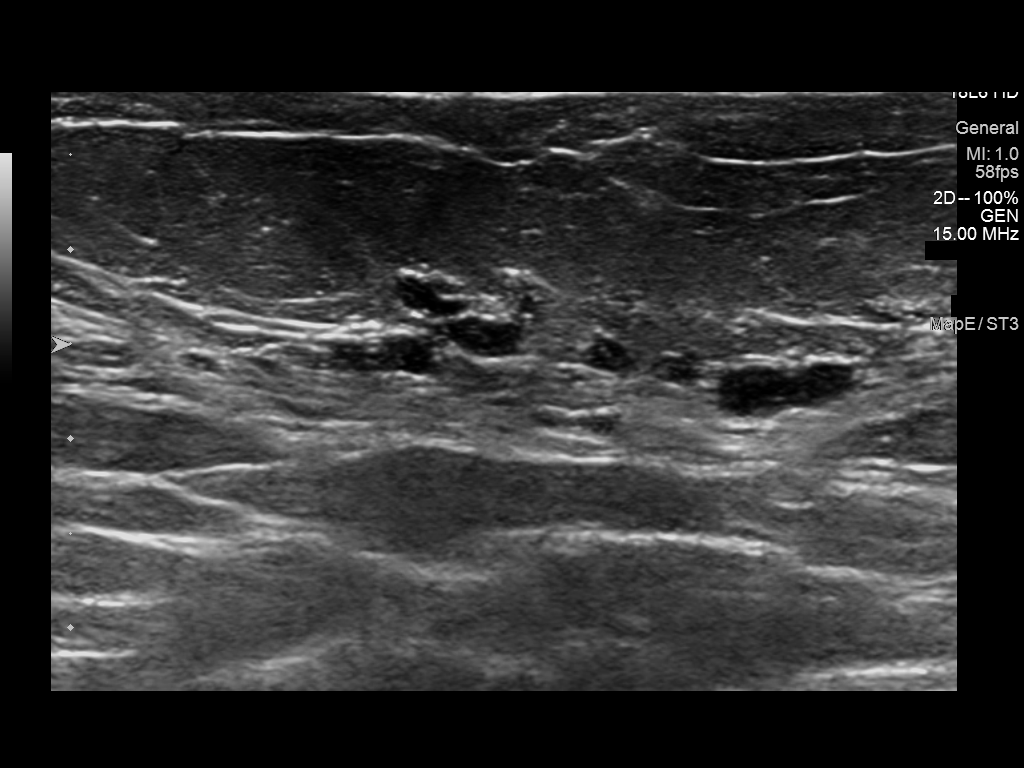

[4 of 4 positions shown; findings below may reference images not displayed]

ACR Breast Density Category b: There are scattered areas of
fibroglandular density.
FINDINGS: Globally asymmetric tissue is again identified in the upper outer
quadrant of the right breast. Numerous circumscribed equal density
masses are scattered throughout. Further evaluation with ultrasound
was performed.

Mammographic images were processed with CAD.

Targeted ultrasound is performed, showing focally dense
fibroglandular tissue in the upper-outer quadrant with numerous
simple and minimally complicated cystic foci scattered throughout.
No suspicious masses or sonographic findings are identified.
IMPRESSION: Probably benign global asymmetry involving the upper outer quadrant
of the right breast with numerous associated simple and minimally
complicated cystic foci. This may represent fat necrosis from the
patient's previous falls. Recommendation is for short-term
mammographic follow-up. Additional ultrasound can be performed at
the discretion of the interpreting radiologist.

RECOMMENDATION:
Diagnostic right breast mammogram and possible ultrasound in 6
months.

I have discussed the findings and recommendations with the patient.
Results were also provided in writing at the conclusion of the
visit. If applicable, a reminder letter will be sent to the patient
regarding the next appointment.

BI-RADS CATEGORY  3: Probably benign.

## 2017-12-01 NOTE — Telephone Encounter (Signed)
Have put in referral and faxed notes. Previously family was going to schedule, and Dr Aline Brochure was advised.

## 2017-12-01 NOTE — Telephone Encounter (Signed)
I have advised patient of the referral in process.

## 2017-12-21 DIAGNOSIS — J069 Acute upper respiratory infection, unspecified: Secondary | ICD-10-CM | POA: Diagnosis not present

## 2017-12-21 DIAGNOSIS — Z6830 Body mass index (BMI) 30.0-30.9, adult: Secondary | ICD-10-CM | POA: Diagnosis not present

## 2017-12-21 DIAGNOSIS — E6609 Other obesity due to excess calories: Secondary | ICD-10-CM | POA: Diagnosis not present

## 2017-12-29 DIAGNOSIS — J329 Chronic sinusitis, unspecified: Secondary | ICD-10-CM | POA: Diagnosis not present

## 2017-12-29 DIAGNOSIS — R490 Dysphonia: Secondary | ICD-10-CM | POA: Diagnosis not present

## 2017-12-29 DIAGNOSIS — J042 Acute laryngotracheitis: Secondary | ICD-10-CM | POA: Diagnosis not present

## 2017-12-29 DIAGNOSIS — M1991 Primary osteoarthritis, unspecified site: Secondary | ICD-10-CM | POA: Diagnosis not present

## 2017-12-29 DIAGNOSIS — J9801 Acute bronchospasm: Secondary | ICD-10-CM | POA: Diagnosis not present

## 2017-12-29 DIAGNOSIS — Z6830 Body mass index (BMI) 30.0-30.9, adult: Secondary | ICD-10-CM | POA: Diagnosis not present

## 2017-12-29 DIAGNOSIS — I10 Essential (primary) hypertension: Secondary | ICD-10-CM | POA: Diagnosis not present

## 2017-12-29 DIAGNOSIS — E1129 Type 2 diabetes mellitus with other diabetic kidney complication: Secondary | ICD-10-CM | POA: Diagnosis not present

## 2017-12-29 DIAGNOSIS — E6609 Other obesity due to excess calories: Secondary | ICD-10-CM | POA: Diagnosis not present

## 2017-12-30 ENCOUNTER — Ambulatory Visit: Payer: PPO | Admitting: Orthopedic Surgery

## 2018-01-05 ENCOUNTER — Ambulatory Visit: Payer: PPO | Admitting: Cardiology

## 2018-01-06 DIAGNOSIS — E538 Deficiency of other specified B group vitamins: Secondary | ICD-10-CM | POA: Diagnosis not present

## 2018-01-13 DIAGNOSIS — S8991XA Unspecified injury of right lower leg, initial encounter: Secondary | ICD-10-CM | POA: Diagnosis not present

## 2018-01-13 DIAGNOSIS — M47816 Spondylosis without myelopathy or radiculopathy, lumbar region: Secondary | ICD-10-CM | POA: Diagnosis not present

## 2018-01-19 DIAGNOSIS — S83241A Other tear of medial meniscus, current injury, right knee, initial encounter: Secondary | ICD-10-CM | POA: Diagnosis not present

## 2018-01-19 DIAGNOSIS — M948X6 Other specified disorders of cartilage, lower leg: Secondary | ICD-10-CM | POA: Diagnosis not present

## 2018-01-19 DIAGNOSIS — M47816 Spondylosis without myelopathy or radiculopathy, lumbar region: Secondary | ICD-10-CM | POA: Diagnosis not present

## 2018-01-19 DIAGNOSIS — M23321 Other meniscus derangements, posterior horn of medial meniscus, right knee: Secondary | ICD-10-CM | POA: Diagnosis not present

## 2018-01-19 DIAGNOSIS — M438X4 Other specified deforming dorsopathies, thoracic region: Secondary | ICD-10-CM | POA: Diagnosis not present

## 2018-01-27 DIAGNOSIS — L821 Other seborrheic keratosis: Secondary | ICD-10-CM | POA: Diagnosis not present

## 2018-01-27 DIAGNOSIS — L570 Actinic keratosis: Secondary | ICD-10-CM | POA: Diagnosis not present

## 2018-02-02 DIAGNOSIS — D509 Iron deficiency anemia, unspecified: Secondary | ICD-10-CM | POA: Diagnosis not present

## 2018-02-02 DIAGNOSIS — E559 Vitamin D deficiency, unspecified: Secondary | ICD-10-CM | POA: Diagnosis not present

## 2018-02-02 DIAGNOSIS — Z79899 Other long term (current) drug therapy: Secondary | ICD-10-CM | POA: Diagnosis not present

## 2018-02-02 DIAGNOSIS — R809 Proteinuria, unspecified: Secondary | ICD-10-CM | POA: Diagnosis not present

## 2018-02-02 DIAGNOSIS — I10 Essential (primary) hypertension: Secondary | ICD-10-CM | POA: Diagnosis not present

## 2018-02-02 DIAGNOSIS — N183 Chronic kidney disease, stage 3 (moderate): Secondary | ICD-10-CM | POA: Diagnosis not present

## 2018-02-05 ENCOUNTER — Encounter: Payer: Self-pay | Admitting: Internal Medicine

## 2018-02-08 ENCOUNTER — Other Ambulatory Visit: Payer: Self-pay | Admitting: Cardiology

## 2018-02-08 DIAGNOSIS — I6523 Occlusion and stenosis of bilateral carotid arteries: Secondary | ICD-10-CM

## 2018-02-08 DIAGNOSIS — N2581 Secondary hyperparathyroidism of renal origin: Secondary | ICD-10-CM | POA: Diagnosis not present

## 2018-02-08 DIAGNOSIS — R809 Proteinuria, unspecified: Secondary | ICD-10-CM | POA: Diagnosis not present

## 2018-02-08 DIAGNOSIS — E559 Vitamin D deficiency, unspecified: Secondary | ICD-10-CM | POA: Diagnosis not present

## 2018-02-08 DIAGNOSIS — N184 Chronic kidney disease, stage 4 (severe): Secondary | ICD-10-CM | POA: Diagnosis not present

## 2018-02-08 NOTE — Progress Notes (Signed)
HPI: FU CAD; last catheterization in September 2008. At that time, she was found to have significant left circumflex disease as well as marginal disease to correlate with perfusion abnormality on her Myoview. However, it was felt that these are small vessels and would be difficult to intervene on either percutaneously or from a surgical standpoint. Her only other disease was in the PDA at 50-70%. Nuclear study May 2016 showed ejection fraction 76% and inferior lateral ischemia consistent with known circumflex disease. Carotid dopplers 9/18 revealed 40-59 bilateral stenosis. Since I last saw her,  there is no dyspnea, syncope or pedal edema.  She has occasional brief chest pain for 1 to 2 seconds but not sustained.  Unchanged.  Current Outpatient Medications  Medication Sig Dispense Refill  . acetaminophen (TYLENOL) 500 MG tablet Take 1,000 mg by mouth every 6 (six) hours as needed for mild pain or moderate pain.    Marland Kitchen amLODipine (NORVASC) 10 MG tablet Take 10 mg by mouth daily.      Marland Kitchen aspirin 81 MG tablet Take 81 mg by mouth every morning.     . Cholecalciferol (VITAMIN D-3) 1000 units CAPS Take 2 capsules by mouth daily.     . cyanocobalamin (,VITAMIN B-12,) 1000 MCG/ML injection Inject 1,000 mcg into the muscle every 30 (thirty) days.    . diazepam (VALIUM) 10 MG tablet Take 10 mg by mouth every 6 (six) hours as needed. Takes 3-4 times per day as needed for being upset. Always takes 1 tablet at bedtime    . docusate sodium (COLACE) 100 MG capsule Take 100 mg by mouth 2 (two) times daily.    . furosemide (LASIX) 20 MG tablet Take 20 mg by mouth as needed. For decreasing your potassium level and for fluid retention.    . gabapentin (NEURONTIN) 100 MG capsule Take 1 capsule (100 mg total) by mouth 3 (three) times daily. 90 capsule 2  . glipiZIDE (GLUCOTROL XL) 10 MG 24 hr tablet Take 10 mg by mouth 2 (two) times daily.     . hydrALAZINE (APRESOLINE) 25 MG tablet Take 25 mg by mouth 3 (three)  times daily.    . insulin glargine (LANTUS) 100 UNIT/ML injection Inject 20-50 Units into the skin at bedtime. Adjusts amount based on her blood sugar levels    . isosorbide mononitrate (IMDUR) 30 MG 24 hr tablet Take 30 mg by mouth daily.     Marland Kitchen losartan (COZAAR) 100 MG tablet Take 100 mg by mouth daily.    . Melatonin 3 MG TABS Take 3 mg by mouth at bedtime as needed (sleep).     . metoprolol tartrate (LOPRESSOR) 50 MG tablet Take 50 mg by mouth 2 (two) times daily.    . nitroGLYCERIN (NITROSTAT) 0.4 MG SL tablet Place 0.4 mg under the tongue every 5 (five) minutes x 3 doses as needed (last dose 1 week ago 03/05/2015). For chest pain    . OVER THE COUNTER MEDICATION Take 1 capsule by mouth daily. MEGA RED KRILL OIL    . rosuvastatin (CRESTOR) 40 MG tablet TAKE ONE TABLET BY MOUTH AT BEDTIME. 30 tablet 11   No current facility-administered medications for this visit.      Past Medical History:  Diagnosis Date  . Anxiety disorder   . Cat allergies   . Cerebrovascular disease    CVA-left sided weakness  . Chronic kidney disease (CKD), stage III (moderate) ([place]) 08/04/2011  . Coronary atherosclerosis of unspecified type of vessel,  native or graft    MI x2  . Depression   . Diabetes mellitus without complication ([place])   . Glaucoma   . Hemorrhoids   . Hyperlipidemia, mixed   . Kidney stones   . Polyclonal gammopathy 01/29/2012  . PVD (peripheral vascular disease) ([place])   . Unspecified essential hypertension     Past Surgical History:  Procedure Laterality Date  . ABDOMINAL HYSTERECTOMY    . CHOLECYSTECTOMY    . COLONOSCOPY  09/26/08   [id number] rectum/diminutive rectosigmoid polyp/pan colonic diverticula  . COLONOSCOPY N/A 03/14/2015   Procedure: COLONOSCOPY;  Surgeon: Daneil Dolin, MD;  [facility];  Service: Endoscopy;  Laterality: N/A;  1030 - moved to 10:15 - office to notify  . KIDNEY STONE SURGERY    . laparoectomy    . polyectomy    . TUBAL LIGATION       Social History   Socioeconomic History  . Marital status: Married    Spouse name: Not on file  . Number of children: Not on file  . Years of education: Not on file  . Highest education level: Not on file  Occupational History  . Occupation: Retired  Scientific laboratory technician  . Financial resource strain: Not on file  . Food insecurity:    Worry: Not on file    Inability: Not on file  . Transportation needs:    Medical: Not on file    Non-medical: Not on file  Tobacco Use  . Smoking status: Former Research scientist (life sciences)  . Smokeless tobacco: Never Used  Substance and Sexual Activity  . Alcohol use: No  . Drug use: No  . Sexual activity: Not on file  Lifestyle  . Physical activity:    Days per week: Not on file    Minutes per session: Not on file  . Stress: Not on file  Relationships  . Social connections:    Talks on phone: Not on file    Gets together: Not on file    Attends religious service: Not on file    Active member of club or organization: Not on file    Attends meetings of clubs or organizations: Not on file    Relationship status: Not on file  . Intimate partner violence:    Fear of current or ex partner: Not on file    Emotionally abused: Not on file    Physically abused: Not on file    Forced sexual activity: Not on file  Other Topics Concern  . Not on file  Social History Narrative  . Not on file    Family History  Problem Relation Age of Onset  . Heart attack Father   . Kidney disease Mother   . Colon cancer Other        aunt  . Colon polyps Son   . Colon polyps Other        siblings    ROS: no fevers or chills, productive cough, hemoptysis, dysphasia, odynophagia, melena, hematochezia, dysuria, hematuria, rash, seizure activity, orthopnea, PND, pedal edema, claudication. Remaining systems are negative.  Physical Exam: Well-developed well-nourished in no acute distress.  Skin is warm and dry.  HEENT is normal.  Neck is supple.  Chest is clear to auscultation  with normal expansion.  Cardiovascular exam is regular rate and rhythm.  Abdominal exam nontender or distended. No masses palpated. Extremities show no edema. neuro grossly intact  ECG-normal sinus rhythm at a rate of 63.  Nonspecific ST changes.  Personally reviewed  A/P  1 coronary artery disease-No change in symptoms. Continue medical therapy including aspirin and statin.  Note her previous nuclear study showed ischemia in the distribution of known coronary disease.  2 hypertension-patient's blood pressure is elevated today.  However she follows this at home and it is typically controlled.  Continue present medications and follow.  3 hyperlipidemia-continue statin.  4 carotid artery disease-plan follow-up carotid Dopplers.  5 chronic stage III kidney disease-monitored by nephrology.  Kirk Ruths, MD

## 2018-02-09 DIAGNOSIS — S8991XA Unspecified injury of right lower leg, initial encounter: Secondary | ICD-10-CM | POA: Diagnosis not present

## 2018-02-10 DIAGNOSIS — Z23 Encounter for immunization: Secondary | ICD-10-CM | POA: Diagnosis not present

## 2018-02-10 DIAGNOSIS — E538 Deficiency of other specified B group vitamins: Secondary | ICD-10-CM | POA: Diagnosis not present

## 2018-02-16 ENCOUNTER — Encounter: Payer: Self-pay | Admitting: Cardiology

## 2018-02-16 ENCOUNTER — Ambulatory Visit (HOSPITAL_COMMUNITY)
Admission: RE | Admit: 2018-02-16 | Discharge: 2018-02-16 | Disposition: A | Discharge status: Home / Self Care | Payer: PPO | Source: Ambulatory Visit | Attending: Cardiology | Admitting: Cardiology

## 2018-02-16 ENCOUNTER — Ambulatory Visit (INDEPENDENT_AMBULATORY_CARE_PROVIDER_SITE_OTHER): Payer: PPO | Admitting: Cardiology

## 2018-02-16 VITALS — BP 174/68 | HR 63 | Ht 61.0 in | Wt 156.4 lb

## 2018-02-16 DIAGNOSIS — E7800 Pure hypercholesterolemia, unspecified: Secondary | ICD-10-CM | POA: Diagnosis not present

## 2018-02-16 DIAGNOSIS — I10 Essential (primary) hypertension: Secondary | ICD-10-CM | POA: Diagnosis not present

## 2018-02-16 DIAGNOSIS — I2510 Atherosclerotic heart disease of native coronary artery without angina pectoris: Secondary | ICD-10-CM

## 2018-02-16 DIAGNOSIS — I6523 Occlusion and stenosis of bilateral carotid arteries: Secondary | ICD-10-CM

## 2018-02-16 NOTE — Patient Instructions (Signed)

## 2018-02-17 ENCOUNTER — Other Ambulatory Visit: Payer: Self-pay | Admitting: *Deleted

## 2018-02-17 DIAGNOSIS — I6523 Occlusion and stenosis of bilateral carotid arteries: Secondary | ICD-10-CM

## 2018-02-19 DIAGNOSIS — Z1389 Encounter for screening for other disorder: Secondary | ICD-10-CM | POA: Diagnosis not present

## 2018-02-19 DIAGNOSIS — G894 Chronic pain syndrome: Secondary | ICD-10-CM | POA: Diagnosis not present

## 2018-02-19 DIAGNOSIS — E538 Deficiency of other specified B group vitamins: Secondary | ICD-10-CM | POA: Diagnosis not present

## 2018-02-19 DIAGNOSIS — F419 Anxiety disorder, unspecified: Secondary | ICD-10-CM | POA: Diagnosis not present

## 2018-02-19 DIAGNOSIS — Z0001 Encounter for general adult medical examination with abnormal findings: Secondary | ICD-10-CM | POA: Diagnosis not present

## 2018-02-19 DIAGNOSIS — E1151 Type 2 diabetes mellitus with diabetic peripheral angiopathy without gangrene: Secondary | ICD-10-CM | POA: Diagnosis not present

## 2018-02-19 DIAGNOSIS — M1991 Primary osteoarthritis, unspecified site: Secondary | ICD-10-CM | POA: Diagnosis not present

## 2018-02-19 DIAGNOSIS — I10 Essential (primary) hypertension: Secondary | ICD-10-CM | POA: Diagnosis not present

## 2018-02-19 DIAGNOSIS — Z6830 Body mass index (BMI) 30.0-30.9, adult: Secondary | ICD-10-CM | POA: Diagnosis not present

## 2018-02-19 DIAGNOSIS — N184 Chronic kidney disease, stage 4 (severe): Secondary | ICD-10-CM | POA: Diagnosis not present

## 2018-02-24 DIAGNOSIS — S83231A Complex tear of medial meniscus, current injury, right knee, initial encounter: Secondary | ICD-10-CM | POA: Diagnosis not present

## 2018-02-24 DIAGNOSIS — M7050 Other bursitis of knee, unspecified knee: Secondary | ICD-10-CM | POA: Diagnosis not present

## 2018-02-24 DIAGNOSIS — S8991XA Unspecified injury of right lower leg, initial encounter: Secondary | ICD-10-CM | POA: Diagnosis not present

## 2018-03-22 DIAGNOSIS — D510 Vitamin B12 deficiency anemia due to intrinsic factor deficiency: Secondary | ICD-10-CM | POA: Diagnosis not present

## 2018-04-14 DIAGNOSIS — S83231D Complex tear of medial meniscus, current injury, right knee, subsequent encounter: Secondary | ICD-10-CM | POA: Diagnosis not present

## 2018-04-14 DIAGNOSIS — M7050 Other bursitis of knee, unspecified knee: Secondary | ICD-10-CM | POA: Diagnosis not present

## 2018-04-14 DIAGNOSIS — M170 Bilateral primary osteoarthritis of knee: Secondary | ICD-10-CM | POA: Diagnosis not present

## 2018-04-14 DIAGNOSIS — D510 Vitamin B12 deficiency anemia due to intrinsic factor deficiency: Secondary | ICD-10-CM | POA: Diagnosis not present

## 2018-04-27 DIAGNOSIS — I10 Essential (primary) hypertension: Secondary | ICD-10-CM | POA: Diagnosis not present

## 2018-04-27 DIAGNOSIS — Z6830 Body mass index (BMI) 30.0-30.9, adult: Secondary | ICD-10-CM | POA: Diagnosis not present

## 2018-04-27 DIAGNOSIS — E1140 Type 2 diabetes mellitus with diabetic neuropathy, unspecified: Secondary | ICD-10-CM | POA: Diagnosis not present

## 2018-04-27 DIAGNOSIS — J329 Chronic sinusitis, unspecified: Secondary | ICD-10-CM | POA: Diagnosis not present

## 2018-04-27 DIAGNOSIS — L049 Acute lymphadenitis, unspecified: Secondary | ICD-10-CM | POA: Diagnosis not present

## 2018-05-05 DIAGNOSIS — I10 Essential (primary) hypertension: Secondary | ICD-10-CM | POA: Diagnosis not present

## 2018-05-05 DIAGNOSIS — G894 Chronic pain syndrome: Secondary | ICD-10-CM | POA: Diagnosis not present

## 2018-05-05 DIAGNOSIS — E782 Mixed hyperlipidemia: Secondary | ICD-10-CM | POA: Diagnosis not present

## 2018-05-05 DIAGNOSIS — Z6830 Body mass index (BMI) 30.0-30.9, adult: Secondary | ICD-10-CM | POA: Diagnosis not present

## 2018-05-05 DIAGNOSIS — N184 Chronic kidney disease, stage 4 (severe): Secondary | ICD-10-CM | POA: Diagnosis not present

## 2018-05-05 DIAGNOSIS — R0789 Other chest pain: Secondary | ICD-10-CM | POA: Diagnosis not present

## 2018-05-05 DIAGNOSIS — E1129 Type 2 diabetes mellitus with other diabetic kidney complication: Secondary | ICD-10-CM | POA: Diagnosis not present

## 2018-05-05 DIAGNOSIS — E6609 Other obesity due to excess calories: Secondary | ICD-10-CM | POA: Diagnosis not present

## 2018-05-05 DIAGNOSIS — K224 Dyskinesia of esophagus: Secondary | ICD-10-CM | POA: Diagnosis not present

## 2018-05-05 DIAGNOSIS — J329 Chronic sinusitis, unspecified: Secondary | ICD-10-CM | POA: Diagnosis not present

## 2018-05-12 DIAGNOSIS — E538 Deficiency of other specified B group vitamins: Secondary | ICD-10-CM | POA: Diagnosis not present

## 2018-06-02 DIAGNOSIS — M7050 Other bursitis of knee, unspecified knee: Secondary | ICD-10-CM | POA: Diagnosis not present

## 2018-06-02 DIAGNOSIS — M170 Bilateral primary osteoarthritis of knee: Secondary | ICD-10-CM | POA: Diagnosis not present

## 2018-06-16 DIAGNOSIS — E538 Deficiency of other specified B group vitamins: Secondary | ICD-10-CM | POA: Diagnosis not present

## 2018-07-08 DIAGNOSIS — N184 Chronic kidney disease, stage 4 (severe): Secondary | ICD-10-CM | POA: Diagnosis not present

## 2018-07-08 DIAGNOSIS — E559 Vitamin D deficiency, unspecified: Secondary | ICD-10-CM | POA: Diagnosis not present

## 2018-07-08 DIAGNOSIS — I10 Essential (primary) hypertension: Secondary | ICD-10-CM | POA: Diagnosis not present

## 2018-07-08 DIAGNOSIS — Z79899 Other long term (current) drug therapy: Secondary | ICD-10-CM | POA: Diagnosis not present

## 2018-07-08 DIAGNOSIS — E1129 Type 2 diabetes mellitus with other diabetic kidney complication: Secondary | ICD-10-CM | POA: Diagnosis not present

## 2018-07-08 DIAGNOSIS — D649 Anemia, unspecified: Secondary | ICD-10-CM | POA: Diagnosis not present

## 2018-08-11 DIAGNOSIS — F332 Major depressive disorder, recurrent severe without psychotic features: Secondary | ICD-10-CM | POA: Diagnosis not present

## 2018-08-11 DIAGNOSIS — E538 Deficiency of other specified B group vitamins: Secondary | ICD-10-CM | POA: Diagnosis not present

## 2018-08-11 DIAGNOSIS — Z23 Encounter for immunization: Secondary | ICD-10-CM | POA: Diagnosis not present

## 2018-08-11 DIAGNOSIS — E782 Mixed hyperlipidemia: Secondary | ICD-10-CM | POA: Diagnosis not present

## 2018-09-13 DIAGNOSIS — E538 Deficiency of other specified B group vitamins: Secondary | ICD-10-CM | POA: Diagnosis not present

## 2018-10-20 DIAGNOSIS — E538 Deficiency of other specified B group vitamins: Secondary | ICD-10-CM | POA: Diagnosis not present

## 2018-11-10 DIAGNOSIS — Z1389 Encounter for screening for other disorder: Secondary | ICD-10-CM | POA: Diagnosis not present

## 2018-11-10 DIAGNOSIS — E538 Deficiency of other specified B group vitamins: Secondary | ICD-10-CM | POA: Diagnosis not present

## 2018-11-10 DIAGNOSIS — N200 Calculus of kidney: Secondary | ICD-10-CM | POA: Diagnosis not present

## 2018-11-10 DIAGNOSIS — G894 Chronic pain syndrome: Secondary | ICD-10-CM | POA: Diagnosis not present

## 2018-11-10 DIAGNOSIS — E1151 Type 2 diabetes mellitus with diabetic peripheral angiopathy without gangrene: Secondary | ICD-10-CM | POA: Diagnosis not present

## 2018-11-10 DIAGNOSIS — E6609 Other obesity due to excess calories: Secondary | ICD-10-CM | POA: Diagnosis not present

## 2018-11-10 DIAGNOSIS — E119 Type 2 diabetes mellitus without complications: Secondary | ICD-10-CM | POA: Diagnosis not present

## 2018-11-10 DIAGNOSIS — I10 Essential (primary) hypertension: Secondary | ICD-10-CM | POA: Diagnosis not present

## 2018-11-10 DIAGNOSIS — Z0001 Encounter for general adult medical examination with abnormal findings: Secondary | ICD-10-CM | POA: Diagnosis not present

## 2018-11-10 DIAGNOSIS — Z6831 Body mass index (BMI) 31.0-31.9, adult: Secondary | ICD-10-CM | POA: Diagnosis not present

## 2018-12-17 DIAGNOSIS — E119 Type 2 diabetes mellitus without complications: Secondary | ICD-10-CM | POA: Diagnosis not present

## 2018-12-17 DIAGNOSIS — H5213 Myopia, bilateral: Secondary | ICD-10-CM | POA: Diagnosis not present

## 2019-01-20 DIAGNOSIS — E538 Deficiency of other specified B group vitamins: Secondary | ICD-10-CM | POA: Diagnosis not present

## 2019-01-31 DIAGNOSIS — L728 Other follicular cysts of the skin and subcutaneous tissue: Secondary | ICD-10-CM | POA: Diagnosis not present

## 2019-01-31 DIAGNOSIS — L309 Dermatitis, unspecified: Secondary | ICD-10-CM | POA: Diagnosis not present

## 2019-01-31 DIAGNOSIS — L280 Lichen simplex chronicus: Secondary | ICD-10-CM | POA: Diagnosis not present

## 2019-01-31 DIAGNOSIS — L570 Actinic keratosis: Secondary | ICD-10-CM | POA: Diagnosis not present

## 2019-02-11 DIAGNOSIS — N184 Chronic kidney disease, stage 4 (severe): Secondary | ICD-10-CM | POA: Diagnosis not present

## 2019-02-11 DIAGNOSIS — E1129 Type 2 diabetes mellitus with other diabetic kidney complication: Secondary | ICD-10-CM | POA: Diagnosis not present

## 2019-02-11 DIAGNOSIS — M1991 Primary osteoarthritis, unspecified site: Secondary | ICD-10-CM | POA: Diagnosis not present

## 2019-02-11 DIAGNOSIS — I10 Essential (primary) hypertension: Secondary | ICD-10-CM | POA: Diagnosis not present

## 2019-02-11 DIAGNOSIS — Z6830 Body mass index (BMI) 30.0-30.9, adult: Secondary | ICD-10-CM | POA: Diagnosis not present

## 2019-02-11 DIAGNOSIS — R5383 Other fatigue: Secondary | ICD-10-CM | POA: Diagnosis not present

## 2019-02-22 ENCOUNTER — Other Ambulatory Visit (HOSPITAL_COMMUNITY): Payer: Self-pay | Admitting: Cardiology

## 2019-02-22 DIAGNOSIS — I6523 Occlusion and stenosis of bilateral carotid arteries: Secondary | ICD-10-CM

## 2019-02-23 DIAGNOSIS — Z23 Encounter for immunization: Secondary | ICD-10-CM | POA: Diagnosis not present

## 2019-03-01 ENCOUNTER — Ambulatory Visit: Payer: PPO | Admitting: Cardiology

## 2019-03-02 ENCOUNTER — Ambulatory Visit (HOSPITAL_COMMUNITY)
Admission: RE | Admit: 2019-03-02 | Discharge: 2019-03-02 | Disposition: A | Discharge status: Home / Self Care | Payer: PPO | Source: Ambulatory Visit | Attending: Cardiology | Admitting: Cardiology

## 2019-03-02 ENCOUNTER — Ambulatory Visit (INDEPENDENT_AMBULATORY_CARE_PROVIDER_SITE_OTHER): Payer: PPO | Admitting: Cardiology

## 2019-03-02 ENCOUNTER — Other Ambulatory Visit: Payer: Self-pay

## 2019-03-02 ENCOUNTER — Encounter: Payer: Self-pay | Admitting: *Deleted

## 2019-03-02 ENCOUNTER — Other Ambulatory Visit: Payer: Self-pay | Admitting: *Deleted

## 2019-03-02 ENCOUNTER — Other Ambulatory Visit (HOSPITAL_COMMUNITY): Payer: Self-pay | Admitting: Cardiology

## 2019-03-02 ENCOUNTER — Encounter: Payer: Self-pay | Admitting: Cardiology

## 2019-03-02 VITALS — BP 132/62 | HR 57 | Ht 60.0 in | Wt 152.4 lb

## 2019-03-02 DIAGNOSIS — I6523 Occlusion and stenosis of bilateral carotid arteries: Secondary | ICD-10-CM

## 2019-03-02 DIAGNOSIS — I2510 Atherosclerotic heart disease of native coronary artery without angina pectoris: Secondary | ICD-10-CM | POA: Diagnosis not present

## 2019-03-02 NOTE — Progress Notes (Signed)
HPI: FU CAD; last catheterization in September 2008. At that time, she was found to have significant left circumflex disease as well as marginal disease to correlate with perfusion abnormality on her Myoview. However, it was felt that these are small vessels and would be difficult to intervene on either percutaneously or from a surgical standpoint. Her only other disease was in the PDA at 50-70%. Nuclear study May 2016 showed ejection fraction 76% and inferior lateral ischemia consistent with known circumflex disease.Carotid dopplers 10/19 revealed 40-59 bilateral stenosis.Since I last saw her,she has some dyspnea with activities but has limited mobility because of orthopedic problems.  She has an ache in her chest with moving as well.  No radiation.  Current Outpatient Medications  Medication Sig Dispense Refill  . acetaminophen (TYLENOL) 500 MG tablet Take 1,000 mg by mouth every 6 (six) hours as needed for mild pain or moderate pain.    Marland Kitchen alendronate (FOSAMAX) 70 MG tablet Take 1 tablet by mouth once a week.    Marland Kitchen amLODipine (NORVASC) 10 MG tablet Take 10 mg by mouth daily.      Marland Kitchen aspirin 81 MG tablet Take 81 mg by mouth every morning.     . Cholecalciferol (VITAMIN D-3) 1000 units CAPS Take 2 capsules by mouth daily.     . cyanocobalamin (,VITAMIN B-12,) 1000 MCG/ML injection Inject 1,000 mcg into the muscle every 30 (thirty) days.    . diazepam (VALIUM) 10 MG tablet Take 10 mg by mouth every 6 (six) hours as needed. Takes 3-4 times per day as needed for being upset. Always takes 1 tablet at bedtime    . docusate sodium (COLACE) 100 MG capsule Take 100 mg by mouth 2 (two) times daily.    Marland Kitchen gabapentin (NEURONTIN) 100 MG capsule Take 1 capsule (100 mg total) by mouth 3 (three) times daily. 90 capsule 2  . glipiZIDE (GLUCOTROL XL) 10 MG 24 hr tablet Take 10 mg by mouth 2 (two) times daily.     . hydrALAZINE (APRESOLINE) 25 MG tablet Take 25 mg by mouth 3 (three) times daily.    . insulin  glargine (LANTUS) 100 UNIT/ML injection Inject 20-50 Units into the skin at bedtime. Adjusts amount based on her blood sugar levels    . isosorbide mononitrate (IMDUR) 30 MG 24 hr tablet Take 30 mg by mouth daily.     . Melatonin 3 MG TABS Take 3 mg by mouth at bedtime as needed (sleep).     . metoprolol tartrate (LOPRESSOR) 50 MG tablet Take 50 mg by mouth 2 (two) times daily.    . nitroGLYCERIN (NITROSTAT) 0.4 MG SL tablet Place 0.4 mg under the tongue every 5 (five) minutes x 3 doses as needed (last dose 1 week ago 03/05/2015). For chest pain    . OVER THE COUNTER MEDICATION Take 1 capsule by mouth daily. MEGA RED KRILL OIL    . rosuvastatin (CRESTOR) 40 MG tablet TAKE ONE TABLET BY MOUTH AT BEDTIME. 30 tablet 11  . furosemide (LASIX) 20 MG tablet Take 20 mg by mouth as needed. For decreasing your potassium level and for fluid retention.     No current facility-administered medications for this visit.      Past Medical History:  Diagnosis Date  . Anxiety disorder   . Cat allergies   . Cerebrovascular disease    CVA-left sided weakness  . Chronic kidney disease (CKD), stage III (moderate) 08/04/2011  . Coronary atherosclerosis of unspecified type of  vessel, native or graft    MI x2  . Depression   . Diabetes mellitus without complication ([place])   . Glaucoma   . Hemorrhoids   . Hyperlipidemia, mixed   . Kidney stones   . Polyclonal gammopathy 01/29/2012  . PVD (peripheral vascular disease) ([place])   . Unspecified essential hypertension     Past Surgical History:  Procedure Laterality Date  . ABDOMINAL HYSTERECTOMY    . CHOLECYSTECTOMY    . COLONOSCOPY  09/26/08   [id number] rectum/diminutive rectosigmoid polyp/pan colonic diverticula  . COLONOSCOPY N/A 03/14/2015   Procedure: COLONOSCOPY;  Surgeon: Daneil Dolin, MD;  [facility];  Service: Endoscopy;  Laterality: N/A;  1030 - moved to 10:15 - office to notify  . KIDNEY STONE SURGERY    . laparoectomy    . polyectomy     . TUBAL LIGATION      Social History   Socioeconomic History  . Marital status: Married    Spouse name: Not on file  . Number of children: Not on file  . Years of education: Not on file  . Highest education level: Not on file  Occupational History  . Occupation: Retired  Scientific laboratory technician  . Financial resource strain: Not on file  . Food insecurity    Worry: Not on file    Inability: Not on file  . Transportation needs    Medical: Not on file    Non-medical: Not on file  Tobacco Use  . Smoking status: Former Research scientist (life sciences)  . Smokeless tobacco: Never Used  Substance and Sexual Activity  . Alcohol use: No  . Drug use: No  . Sexual activity: Not on file  Lifestyle  . Physical activity    Days per week: Not on file    Minutes per session: Not on file  . Stress: Not on file  Relationships  . Social Herbalist on phone: Not on file    Gets together: Not on file    Attends religious service: Not on file    Active member of club or organization: Not on file    Attends meetings of clubs or organizations: Not on file    Relationship status: Not on file  . Intimate partner violence    Fear of current or ex partner: Not on file    Emotionally abused: Not on file    Physically abused: Not on file    Forced sexual activity: Not on file  Other Topics Concern  . Not on file  Social History Narrative  . Not on file    Family History  Problem Relation Age of Onset  . Heart attack Father   . Kidney disease Mother   . Colon cancer Other        aunt  . Colon polyps Son   . Colon polyps Other        siblings    ROS: Arthralgias and weakness but no fevers or chills, productive cough, hemoptysis, dysphasia, odynophagia, melena, hematochezia, dysuria, hematuria, rash, seizure activity, orthopnea, PND, pedal edema, claudication. Remaining systems are negative.  Physical Exam: Well-developed well-nourished in no acute distress.  Skin is warm and dry.  HEENT is normal.  Neck  is supple.  Chest is clear to auscultation with normal expansion.  Cardiovascular exam is regular rate and rhythm.  Abdominal exam nontender or distended. No masses palpated. Extremities show no edema. neuro grossly intact  ECG-sinus bradycardia at a rate of 57, nonspecific ST changes.  Personally reviewed  A/P  1 coronary artery disease-We will continue with medical therapy with aspirin and statin.  As outlined above previous nuclear study showed ischemia in the distribution of known coronary disease.  She does have occasional chest ache.  We discussed further evaluation today including nuclear study.  However her baseline creatinine appears to be 2.86 and there would be risk of contrast nephropathy if cardiac catheterization required.  She would prefer continued medical therapy at this point due to risk to her kidneys.  2 hypertension-blood pressure controlled.  Continue present medications and follow.  3 hyperlipidemia-continue statin.  4 carotid artery disease-we will arrange follow-up carotid Dopplers.  5 chronic stage IV renal insufficiency-followed by nephrology.  Kirk Ruths, MD

## 2019-03-02 NOTE — Patient Instructions (Signed)
Medication Instructions:  NO CHANGE *If you need a refill on your cardiac medications before your next appointment, please call your pharmacy*  Lab Work: If you have labs (blood work) drawn today and your tests are completely normal, you will receive your results only by: Marland Kitchen MyChart Message (if you have MyChart) OR . A paper copy in the mail If you have any lab test that is abnormal or we need to change your treatment, we will call you to review the results.  Follow-Up: At [facility], you and your health needs are our priority.  As part of our continuing mission to provide you with exceptional heart care, we have created designated Provider Care Teams.  These Care Teams include your primary Cardiologist (physician) and Advanced Practice Providers (APPs -  Physician Assistants and Nurse Practitioners) who all work together to provide you with the care you need, when you need it.  Your next appointment:   6 months  The format for your next appointment:   Either In Person or Virtual  Provider:   Kirk Ruths MD

## 2019-03-11 DIAGNOSIS — N189 Chronic kidney disease, unspecified: Secondary | ICD-10-CM | POA: Diagnosis not present

## 2019-03-11 DIAGNOSIS — N184 Chronic kidney disease, stage 4 (severe): Secondary | ICD-10-CM | POA: Diagnosis not present

## 2019-03-11 DIAGNOSIS — D631 Anemia in chronic kidney disease: Secondary | ICD-10-CM | POA: Diagnosis not present

## 2019-03-11 DIAGNOSIS — E211 Secondary hyperparathyroidism, not elsewhere classified: Secondary | ICD-10-CM | POA: Diagnosis not present

## 2019-03-11 DIAGNOSIS — E559 Vitamin D deficiency, unspecified: Secondary | ICD-10-CM | POA: Diagnosis not present

## 2019-03-11 DIAGNOSIS — N170 Acute kidney failure with tubular necrosis: Secondary | ICD-10-CM | POA: Diagnosis not present

## 2019-03-17 DIAGNOSIS — E538 Deficiency of other specified B group vitamins: Secondary | ICD-10-CM | POA: Diagnosis not present

## 2019-03-29 DIAGNOSIS — R809 Proteinuria, unspecified: Secondary | ICD-10-CM | POA: Diagnosis not present

## 2019-03-29 DIAGNOSIS — D631 Anemia in chronic kidney disease: Secondary | ICD-10-CM | POA: Diagnosis not present

## 2019-03-29 DIAGNOSIS — N184 Chronic kidney disease, stage 4 (severe): Secondary | ICD-10-CM | POA: Diagnosis not present

## 2019-03-29 DIAGNOSIS — Z79899 Other long term (current) drug therapy: Secondary | ICD-10-CM | POA: Diagnosis not present

## 2019-03-29 DIAGNOSIS — E559 Vitamin D deficiency, unspecified: Secondary | ICD-10-CM | POA: Diagnosis not present

## 2019-03-31 DIAGNOSIS — E211 Secondary hyperparathyroidism, not elsewhere classified: Secondary | ICD-10-CM | POA: Diagnosis not present

## 2019-03-31 DIAGNOSIS — D631 Anemia in chronic kidney disease: Secondary | ICD-10-CM | POA: Diagnosis not present

## 2019-03-31 DIAGNOSIS — E559 Vitamin D deficiency, unspecified: Secondary | ICD-10-CM | POA: Diagnosis not present

## 2019-03-31 DIAGNOSIS — N184 Chronic kidney disease, stage 4 (severe): Secondary | ICD-10-CM | POA: Diagnosis not present

## 2019-03-31 DIAGNOSIS — N189 Chronic kidney disease, unspecified: Secondary | ICD-10-CM | POA: Diagnosis not present

## 2019-03-31 DIAGNOSIS — N170 Acute kidney failure with tubular necrosis: Secondary | ICD-10-CM | POA: Diagnosis not present

## 2019-04-18 DIAGNOSIS — D510 Vitamin B12 deficiency anemia due to intrinsic factor deficiency: Secondary | ICD-10-CM | POA: Diagnosis not present

## 2019-05-18 DIAGNOSIS — E538 Deficiency of other specified B group vitamins: Secondary | ICD-10-CM | POA: Diagnosis not present

## 2019-06-20 DIAGNOSIS — E538 Deficiency of other specified B group vitamins: Secondary | ICD-10-CM | POA: Diagnosis not present

## 2019-07-22 DIAGNOSIS — E1142 Type 2 diabetes mellitus with diabetic polyneuropathy: Secondary | ICD-10-CM | POA: Diagnosis not present

## 2019-07-22 DIAGNOSIS — M1991 Primary osteoarthritis, unspecified site: Secondary | ICD-10-CM | POA: Diagnosis not present

## 2019-07-22 DIAGNOSIS — N184 Chronic kidney disease, stage 4 (severe): Secondary | ICD-10-CM | POA: Diagnosis not present

## 2019-07-22 DIAGNOSIS — E538 Deficiency of other specified B group vitamins: Secondary | ICD-10-CM | POA: Diagnosis not present

## 2019-07-22 DIAGNOSIS — N189 Chronic kidney disease, unspecified: Secondary | ICD-10-CM | POA: Diagnosis not present

## 2019-07-22 DIAGNOSIS — E559 Vitamin D deficiency, unspecified: Secondary | ICD-10-CM | POA: Diagnosis not present

## 2019-07-22 DIAGNOSIS — Z1389 Encounter for screening for other disorder: Secondary | ICD-10-CM | POA: Diagnosis not present

## 2019-07-22 DIAGNOSIS — E1140 Type 2 diabetes mellitus with diabetic neuropathy, unspecified: Secondary | ICD-10-CM | POA: Diagnosis not present

## 2019-07-22 DIAGNOSIS — E663 Overweight: Secondary | ICD-10-CM | POA: Diagnosis not present

## 2019-07-22 DIAGNOSIS — I10 Essential (primary) hypertension: Secondary | ICD-10-CM | POA: Diagnosis not present

## 2019-07-22 DIAGNOSIS — N170 Acute kidney failure with tubular necrosis: Secondary | ICD-10-CM | POA: Diagnosis not present

## 2019-07-22 DIAGNOSIS — D631 Anemia in chronic kidney disease: Secondary | ICD-10-CM | POA: Diagnosis not present

## 2019-07-22 DIAGNOSIS — Z6829 Body mass index (BMI) 29.0-29.9, adult: Secondary | ICD-10-CM | POA: Diagnosis not present

## 2019-07-22 DIAGNOSIS — K219 Gastro-esophageal reflux disease without esophagitis: Secondary | ICD-10-CM | POA: Diagnosis not present

## 2019-07-22 DIAGNOSIS — E211 Secondary hyperparathyroidism, not elsewhere classified: Secondary | ICD-10-CM | POA: Diagnosis not present

## 2019-08-03 DIAGNOSIS — N170 Acute kidney failure with tubular necrosis: Secondary | ICD-10-CM | POA: Diagnosis not present

## 2019-08-03 DIAGNOSIS — E211 Secondary hyperparathyroidism, not elsewhere classified: Secondary | ICD-10-CM | POA: Diagnosis not present

## 2019-08-03 DIAGNOSIS — Z79899 Other long term (current) drug therapy: Secondary | ICD-10-CM | POA: Diagnosis not present

## 2019-08-03 DIAGNOSIS — N189 Chronic kidney disease, unspecified: Secondary | ICD-10-CM | POA: Diagnosis not present

## 2019-08-03 DIAGNOSIS — D631 Anemia in chronic kidney disease: Secondary | ICD-10-CM | POA: Diagnosis not present

## 2019-08-03 DIAGNOSIS — N184 Chronic kidney disease, stage 4 (severe): Secondary | ICD-10-CM | POA: Diagnosis not present

## 2019-08-16 ENCOUNTER — Ambulatory Visit: Payer: PPO | Admitting: General Surgery

## 2019-08-16 ENCOUNTER — Encounter: Payer: Self-pay | Admitting: General Surgery

## 2019-08-16 ENCOUNTER — Other Ambulatory Visit: Payer: Self-pay

## 2019-08-16 VITALS — BP 149/52 | HR 70 | Temp 98.0°F | Resp 12 | Ht 60.0 in | Wt 153.0 lb

## 2019-08-16 DIAGNOSIS — Z86010 Personal history of colonic polyps: Secondary | ICD-10-CM | POA: Diagnosis not present

## 2019-08-17 NOTE — Progress Notes (Signed)
[name]; [id number]; Dec 05, 1940   HPI Patient is an 79 year old white female who was referred to my care by Dr. Gerarda Fraction for follow-up of colon polyps.  She had a colonoscopy in 2016 and had multiple tubular polyps removed.  None were dysplastic in nature.  Patient denies any family history of colon cancer.  Patient denies any blood per rectum or significant GI complaints.  She has multiple medical problems including worsening kidney disease and coronary artery disease.  She was seen by cardiology within the past 6 months and did not want any significant intervention and is treating her coronary artery disease medically.  She has 0 out of 10 abdominal pain.  She does ambulate with a cane.  Occassional constipation requiring suppositories.  Past Medical History:  Diagnosis Date  . Anxiety disorder   . Cat allergies   . Cerebrovascular disease    CVA-left sided weakness  . Chronic kidney disease (CKD), stage III (moderate) 08/04/2011  . Coronary atherosclerosis of unspecified type of vessel, native or graft    MI x2  . Depression   . Diabetes mellitus without complication ([place])   . Glaucoma   . Hemorrhoids   . Hyperlipidemia, mixed   . Kidney stones   . Polyclonal gammopathy 01/29/2012  . PVD (peripheral vascular disease) ([place])   . Unspecified essential hypertension     Past Surgical History:  Procedure Laterality Date  . ABDOMINAL HYSTERECTOMY    . CHOLECYSTECTOMY    . COLONOSCOPY  09/26/08   [id number] rectum/diminutive rectosigmoid polyp/pan colonic diverticula  . COLONOSCOPY N/A 03/14/2015   Procedure: COLONOSCOPY;  Surgeon: Daneil Dolin, MD;  [facility];  Service: Endoscopy;  Laterality: N/A;  1030 - moved to 10:15 - office to notify  . KIDNEY STONE SURGERY    . laparoectomy    . polyectomy    . TUBAL LIGATION      Family History  Problem Relation Age of Onset  . Heart attack Father   . Kidney disease Mother   . Colon cancer Other        aunt  . Colon  polyps Son   . Colon polyps Other        siblings    Current Outpatient Medications on File Prior to Visit  Medication Sig Dispense Refill  . acetaminophen (TYLENOL) 500 MG tablet Take 1,000 mg by mouth every 6 (six) hours as needed for mild pain or moderate pain.    Marland Kitchen alendronate (FOSAMAX) 70 MG tablet Take by mouth.    . ALPRAZolam (XANAX) 0.25 MG tablet Take 0.25 mg by mouth 3 (three) times daily as needed.    Marland Kitchen amLODipine (NORVASC) 10 MG tablet Take 10 mg by mouth daily.      Marland Kitchen aspirin 81 MG tablet Take 81 mg by mouth every morning.     . calcitonin, salmon, (MIACALCIN/FORTICAL) 200 UNIT/ACT nasal spray 1 spray daily.    . cyanocobalamin (,VITAMIN B-12,) 1000 MCG/ML injection Inject 1,000 mcg into the muscle every 30 (thirty) days.    . diazepam (VALIUM) 10 MG tablet Take 10 mg by mouth every 6 (six) hours as needed. Takes 3-4 times per day as needed for being upset. Always takes 1 tablet at bedtime    . docusate sodium (COLACE) 100 MG capsule Take 100 mg by mouth 2 (two) times daily.    . fluticasone (FLONASE) 50 MCG/ACT nasal spray SMARTSIG:1 Spray(s) Both Nares Twice Daily PRN    . gabapentin (NEURONTIN) 100 MG capsule  Take 1 capsule (100 mg total) by mouth 3 (three) times daily. 90 capsule 2  . glipiZIDE (GLUCOTROL XL) 10 MG 24 hr tablet Take 10 mg by mouth 2 (two) times daily.     Marland Kitchen glucose blood (ONETOUCH ULTRA) test strip     . hydrALAZINE (APRESOLINE) 25 MG tablet Take 25 mg by mouth 3 (three) times daily.    . insulin glargine (LANTUS) 100 UNIT/ML injection Inject 20-50 Units into the skin at bedtime. Adjusts amount based on her blood sugar levels    . isosorbide mononitrate (IMDUR) 30 MG 24 hr tablet Take 30 mg by mouth daily.     . Melatonin 3 MG TABS Take 3 mg by mouth at bedtime as needed (sleep).     . metoprolol tartrate (LOPRESSOR) 50 MG tablet Take 50 mg by mouth 2 (two) times daily.    . nitroGLYCERIN (NITROSTAT) 0.4 MG SL tablet Place 0.4 mg under the tongue every 5  (five) minutes x 3 doses as needed (last dose 1 week ago 03/05/2015). For chest pain    . ONETOUCH ULTRA test strip USE TO TEST 4 TIMES3DAILY.    Marland Kitchen OVER THE COUNTER MEDICATION Take 1 capsule by mouth daily. MEGA RED KRILL OIL    . pantoprazole (PROTONIX) 40 MG tablet Take 40 mg by mouth daily.    Marland Kitchen PROLIA 60 MG/ML SOSY injection     . rosuvastatin (CRESTOR) 40 MG tablet TAKE ONE TABLET BY MOUTH AT BEDTIME. 30 tablet 11  . sodium polystyrene (KAYEXALATE) powder Take by mouth.    . furosemide (LASIX) 20 MG tablet Take 20 mg by mouth as needed. For decreasing your potassium level and for fluid retention.     No current facility-administered medications on file prior to visit.    Allergies  Allergen Reactions  . Contrast Media [Iodinated Diagnostic Agents] Other (See Comments)    Stops breathing  . Hydromorphone Hcl Shortness Of Breath and Rash  . Biaxin [Clarithromycin] Nausea Only and Other (See Comments)    "weird dreams"  . Cephalexin Other (See Comments)    Skin peels off  Skin peels off  Skin peels off   . Penicillins Other (See Comments)    "passed out" Has patient had a PCN reaction causing immediate rash, facial/tongue/throat swelling, SOB or lightheadedness with hypotension: No no Has patient had a PCN reaction causing severe rash involving mucus membranes or skin necrosis: No no Has patient had a PCN reaction that required hospitalization Yes yes Has patient had a PCN reaction occurring within the last 10 years: No no If all of the above answers are "NO", then may proceed   . Sulfonamide Derivatives Nausea And Vomiting  . Sulfa Antibiotics Nausea And Vomiting    Social History   Substance and Sexual Activity  Alcohol Use No    Social History   Tobacco Use  Smoking Status Former Smoker  Smokeless Tobacco Never Used    Review of Systems  Constitutional: Positive for chills and malaise/fatigue.  HENT: Positive for sinus pain.   Eyes: Negative.   Respiratory:  Positive for shortness of breath.   Cardiovascular: Positive for chest pain.  Gastrointestinal: Negative.   Genitourinary: Negative.   Musculoskeletal: Positive for back pain, joint pain and neck pain.  Skin: Negative.   Neurological: Negative.   Endo/Heme/Allergies: Negative.   Psychiatric/Behavioral: Negative.     Objective   Vitals:   08/16/19 1433  BP: (!) 149/52  Pulse: 70  Resp: 12  Temp: 98  F (36.7 C)  SpO2: 95%    Physical Exam Vitals reviewed.  Constitutional:      Appearance: Normal appearance. She is not ill-appearing.  HENT:     Head: Normocephalic and atraumatic.  Cardiovascular:     Rate and Rhythm: Normal rate and regular rhythm.     Heart sounds: Normal heart sounds. No murmur. No friction rub. No gallop.   Pulmonary:     Effort: Pulmonary effort is normal. No respiratory distress.     Breath sounds: Normal breath sounds. No stridor. No wheezing, rhonchi or rales.  Abdominal:     General: Bowel sounds are normal. There is no distension.     Palpations: Abdomen is soft. There is no mass.     Tenderness: There is no abdominal tenderness. There is no guarding or rebound.     Hernia: No hernia is present.  Skin:    General: Skin is warm and dry.  Neurological:     Mental Status: She is oriented to person, place, and time.   Primary care notes reviewed Cardiology consultation note reviewed Colonoscopy and pathology reports reviewed  Assessment  History of colon polyps, currently asymptomatic.  Multiple medical problems including coronary artery disease and worsening kidney disease. Plan   Patient does not want a colonoscopy unless absolutely necessary.  She is at increased risk for anesthesia due to her comorbidities.  She does realize that I cannot give her 100% guarantee that she does not have colon cancer, but the chances are low.  Patient will return to my care should she develop blood per rectum or significant abdominal pain.  Should her  constipation worsen, barium enema may be indicated.

## 2019-09-13 ENCOUNTER — Encounter: Payer: Self-pay | Admitting: General Practice

## 2019-09-21 DIAGNOSIS — E538 Deficiency of other specified B group vitamins: Secondary | ICD-10-CM | POA: Diagnosis not present

## 2019-09-29 DIAGNOSIS — Z79899 Other long term (current) drug therapy: Secondary | ICD-10-CM | POA: Diagnosis not present

## 2019-09-29 DIAGNOSIS — E211 Secondary hyperparathyroidism, not elsewhere classified: Secondary | ICD-10-CM | POA: Diagnosis not present

## 2019-09-29 DIAGNOSIS — N170 Acute kidney failure with tubular necrosis: Secondary | ICD-10-CM | POA: Diagnosis not present

## 2019-09-29 DIAGNOSIS — N184 Chronic kidney disease, stage 4 (severe): Secondary | ICD-10-CM | POA: Diagnosis not present

## 2019-09-29 DIAGNOSIS — D631 Anemia in chronic kidney disease: Secondary | ICD-10-CM | POA: Diagnosis not present

## 2019-09-29 DIAGNOSIS — N189 Chronic kidney disease, unspecified: Secondary | ICD-10-CM | POA: Diagnosis not present

## 2019-10-05 DIAGNOSIS — E211 Secondary hyperparathyroidism, not elsewhere classified: Secondary | ICD-10-CM | POA: Diagnosis not present

## 2019-10-05 DIAGNOSIS — N170 Acute kidney failure with tubular necrosis: Secondary | ICD-10-CM | POA: Diagnosis not present

## 2019-10-05 DIAGNOSIS — N184 Chronic kidney disease, stage 4 (severe): Secondary | ICD-10-CM | POA: Diagnosis not present

## 2019-10-05 DIAGNOSIS — N189 Chronic kidney disease, unspecified: Secondary | ICD-10-CM | POA: Diagnosis not present

## 2019-10-05 DIAGNOSIS — D631 Anemia in chronic kidney disease: Secondary | ICD-10-CM | POA: Diagnosis not present

## 2019-10-05 DIAGNOSIS — E872 Acidosis: Secondary | ICD-10-CM | POA: Diagnosis not present

## 2019-10-12 ENCOUNTER — Other Ambulatory Visit (HOSPITAL_COMMUNITY): Payer: Self-pay | Admitting: Nephrology

## 2019-10-12 ENCOUNTER — Other Ambulatory Visit: Payer: Self-pay | Admitting: Nephrology

## 2019-10-12 DIAGNOSIS — N170 Acute kidney failure with tubular necrosis: Secondary | ICD-10-CM

## 2019-10-19 ENCOUNTER — Ambulatory Visit (HOSPITAL_COMMUNITY)
Admission: RE | Admit: 2019-10-19 | Discharge: 2019-10-19 | Disposition: A | Discharge status: Home / Self Care | Payer: PPO | Source: Ambulatory Visit | Attending: Nephrology | Admitting: Nephrology

## 2019-10-19 ENCOUNTER — Other Ambulatory Visit: Payer: Self-pay

## 2019-10-19 DIAGNOSIS — N179 Acute kidney failure, unspecified: Secondary | ICD-10-CM | POA: Diagnosis not present

## 2019-10-19 DIAGNOSIS — N170 Acute kidney failure with tubular necrosis: Secondary | ICD-10-CM

## 2019-10-19 IMAGING — US US RENAL
1 series · 14 of 25 positions shown · non-contrast
Comparison: CT [DATE], ultrasound [DATE]

CLINICAL DATA: Acute kidney failure

EXAM:
RENAL / URINARY TRACT ULTRASOUND COMPLETE

[Series 1: us renal · 14 of 49 slices shown]
[im 1/49]
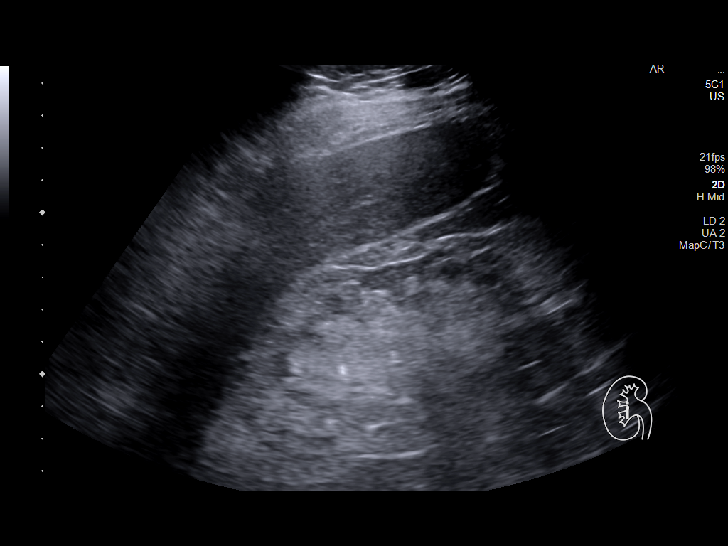
[im 5/49]
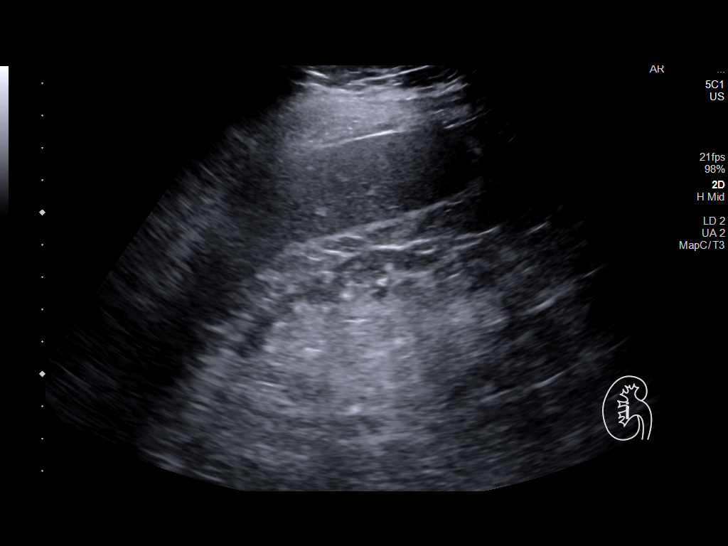
[im 9/49]
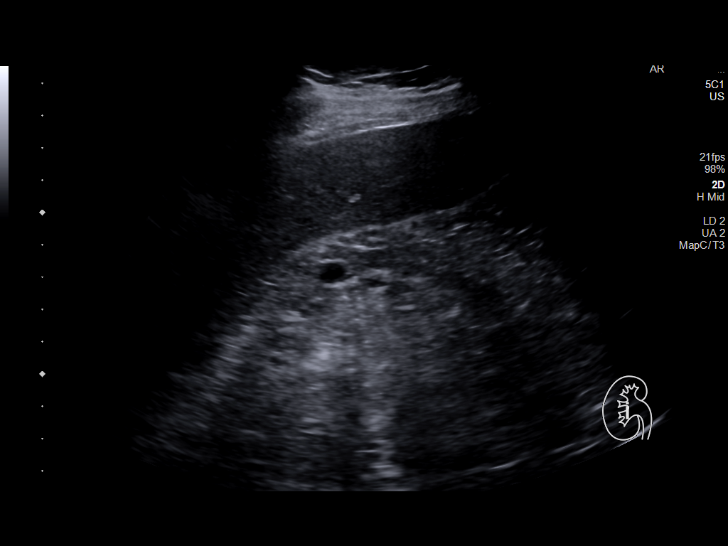
[im 13/49]
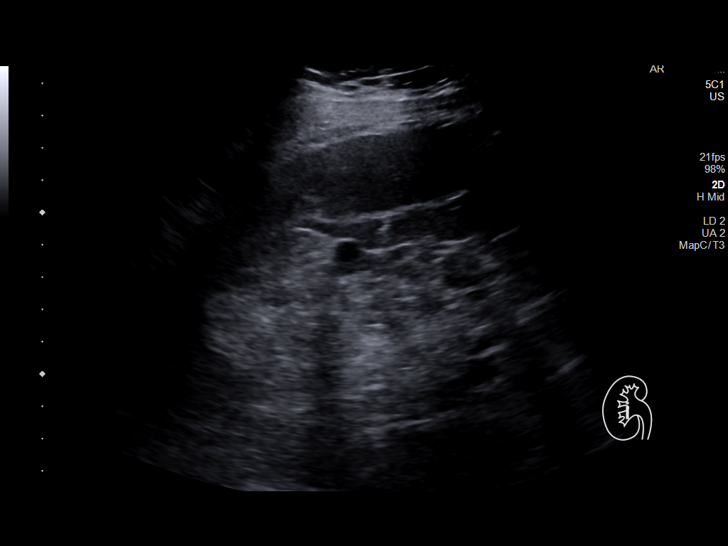
[im 17/49]
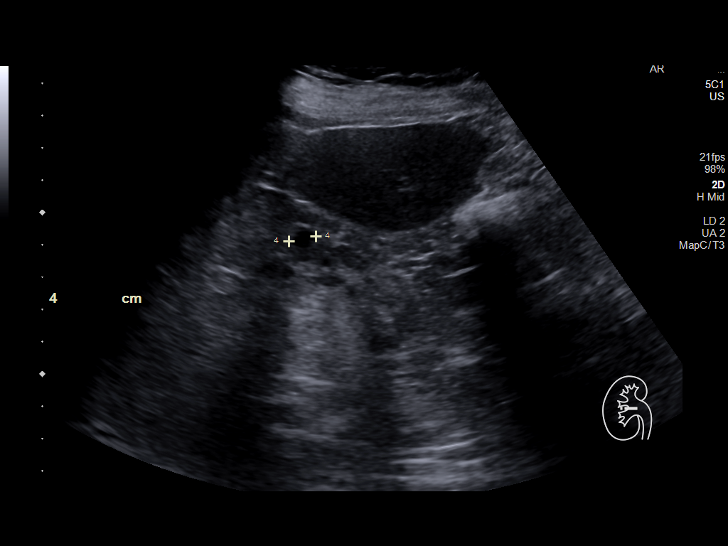
[im 19/49]
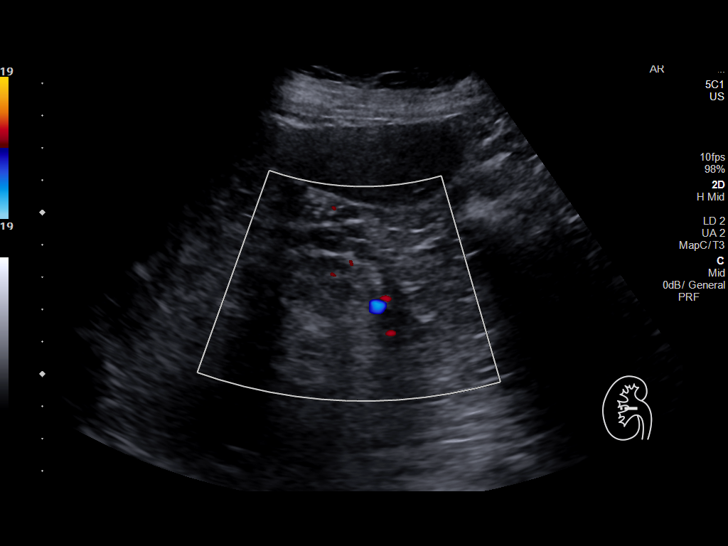
[im 23/49]
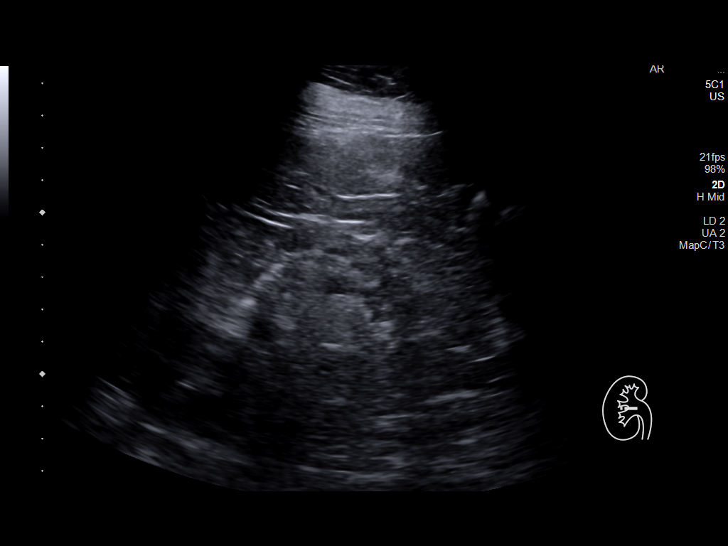
[im 27/49]
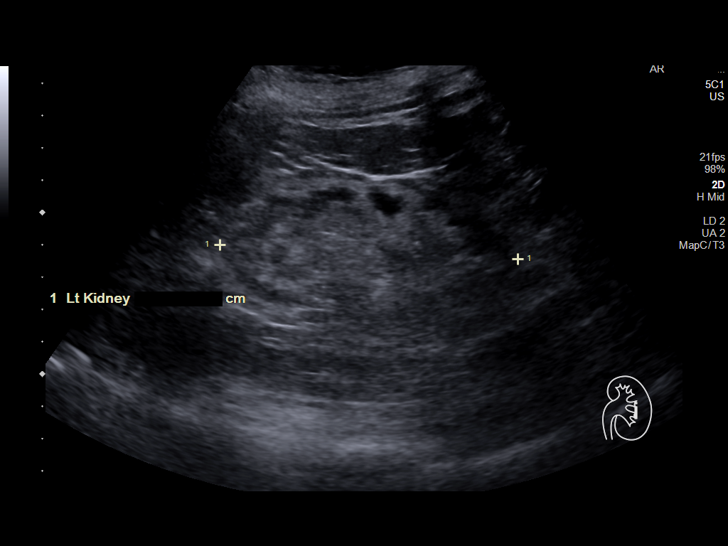
[im 31/49]
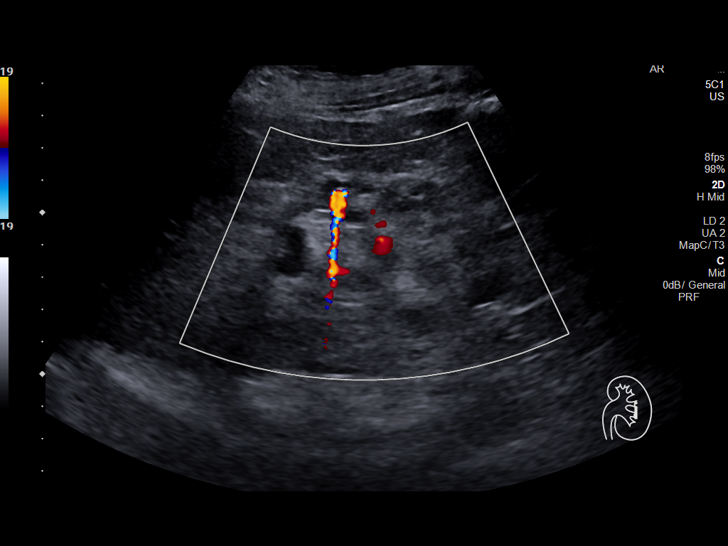
[im 33/49]
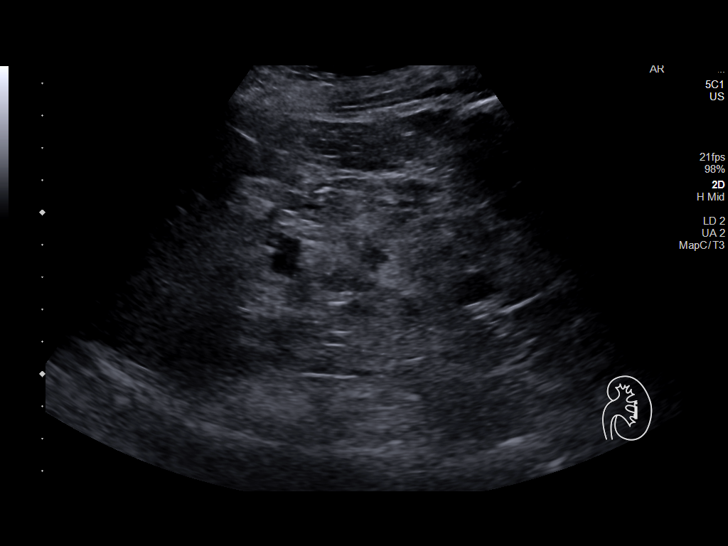
[im 37/49]
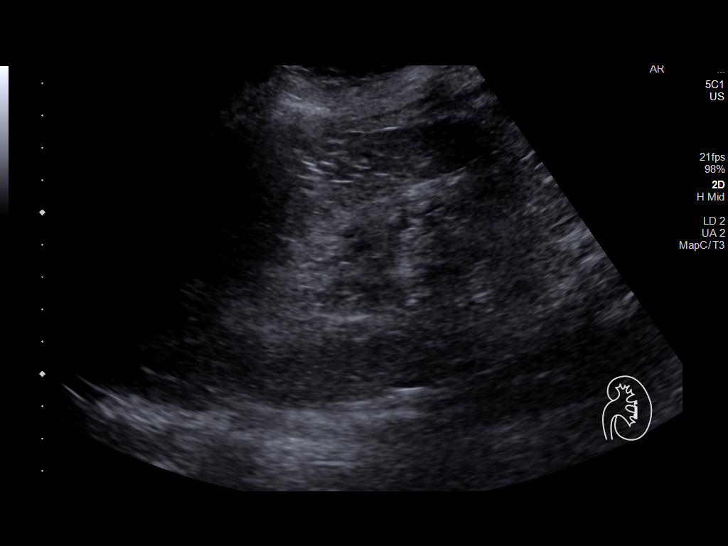
[im 41/49]
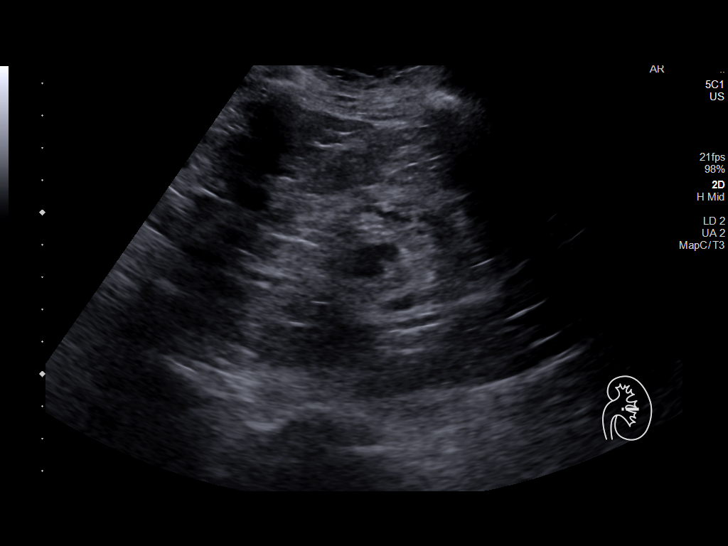
[im 45/49]
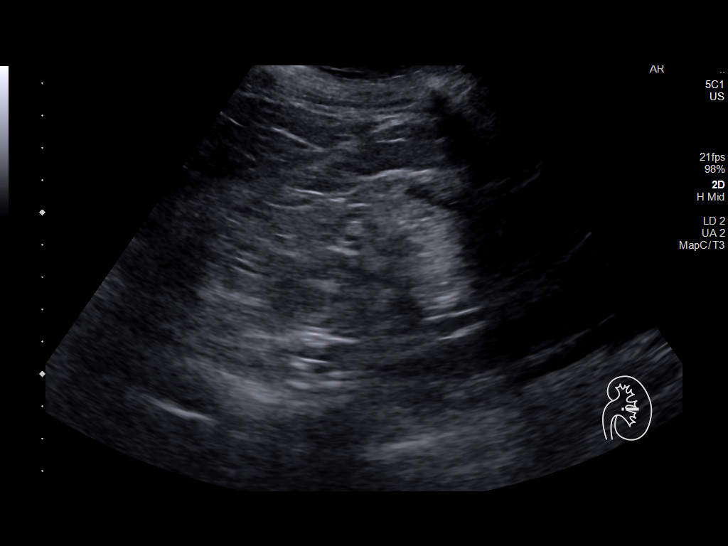
[im 49/49]
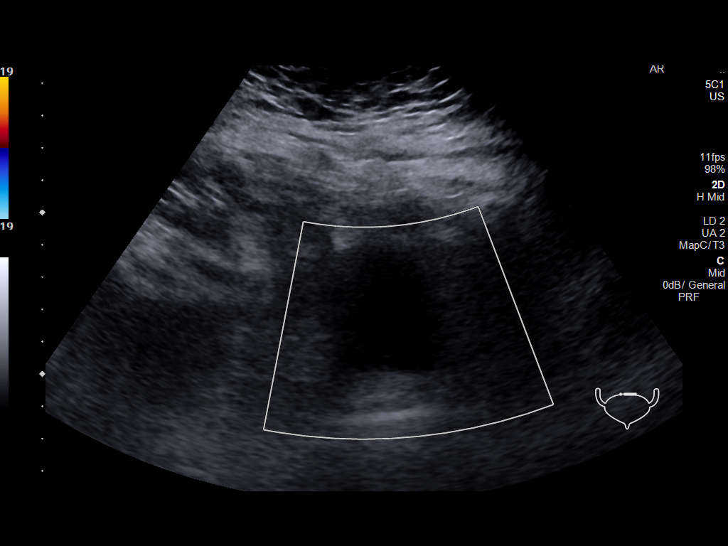

[14 of 25 positions shown; findings below may reference images not displayed]

FINDINGS: Right Kidney:

Renal measurements: 9.7 x 5.2 x 5 cm = volume: 132. mL. Echogenic
cortex with diffuse cortical thinning. No hydronephrosis. Small
cysts, fewer than 10. Upper pole cysts measure 1.2 and 0.9 cm
maximum.

Left Kidney:

Renal measurements: 8.9 x 4.4 x 5.2 cm = volume: 106.4 mL. Echogenic
cortex with diffuse cortical thinning. Decreased corticomedullary
differentiation. Renal pelvis dilatation without calyceal
dilatation. Cysts within the upper and lower pole, upper pole cyst
measures 1.4 cm and lower pole cyst measures 1.4 cm.

Bladder:

Appears normal for degree of bladder distention.

Other:

None.
IMPRESSION: 1. Echogenic kidneys with diffuse cortical atrophy, consistent with
medical renal disease. Left renal pelvis dilatation without
convincing hydronephrosis. No hydronephrosis on the right.
2. Cysts, fewer than 10 within both kidneys.

## 2019-10-24 DIAGNOSIS — E538 Deficiency of other specified B group vitamins: Secondary | ICD-10-CM | POA: Diagnosis not present

## 2019-11-11 DIAGNOSIS — N184 Chronic kidney disease, stage 4 (severe): Secondary | ICD-10-CM | POA: Diagnosis not present

## 2019-11-11 DIAGNOSIS — E211 Secondary hyperparathyroidism, not elsewhere classified: Secondary | ICD-10-CM | POA: Diagnosis not present

## 2019-11-11 DIAGNOSIS — N189 Chronic kidney disease, unspecified: Secondary | ICD-10-CM | POA: Diagnosis not present

## 2019-11-11 DIAGNOSIS — D631 Anemia in chronic kidney disease: Secondary | ICD-10-CM | POA: Diagnosis not present

## 2019-11-11 DIAGNOSIS — N170 Acute kidney failure with tubular necrosis: Secondary | ICD-10-CM | POA: Diagnosis not present

## 2019-11-28 DIAGNOSIS — E538 Deficiency of other specified B group vitamins: Secondary | ICD-10-CM | POA: Diagnosis not present

## 2019-12-05 ENCOUNTER — Ambulatory Visit: Payer: PPO | Admitting: Cardiology

## 2019-12-12 DIAGNOSIS — N184 Chronic kidney disease, stage 4 (severe): Secondary | ICD-10-CM | POA: Diagnosis not present

## 2019-12-12 DIAGNOSIS — Z79899 Other long term (current) drug therapy: Secondary | ICD-10-CM | POA: Diagnosis not present

## 2019-12-14 DIAGNOSIS — E1122 Type 2 diabetes mellitus with diabetic chronic kidney disease: Secondary | ICD-10-CM | POA: Diagnosis not present

## 2019-12-14 DIAGNOSIS — E211 Secondary hyperparathyroidism, not elsewhere classified: Secondary | ICD-10-CM | POA: Diagnosis not present

## 2019-12-14 DIAGNOSIS — D631 Anemia in chronic kidney disease: Secondary | ICD-10-CM | POA: Diagnosis not present

## 2019-12-14 DIAGNOSIS — E1129 Type 2 diabetes mellitus with other diabetic kidney complication: Secondary | ICD-10-CM | POA: Diagnosis not present

## 2019-12-14 DIAGNOSIS — N170 Acute kidney failure with tubular necrosis: Secondary | ICD-10-CM | POA: Diagnosis not present

## 2019-12-14 DIAGNOSIS — R809 Proteinuria, unspecified: Secondary | ICD-10-CM | POA: Diagnosis not present

## 2019-12-14 DIAGNOSIS — N189 Chronic kidney disease, unspecified: Secondary | ICD-10-CM | POA: Diagnosis not present

## 2019-12-14 DIAGNOSIS — E875 Hyperkalemia: Secondary | ICD-10-CM | POA: Diagnosis not present

## 2019-12-14 NOTE — Progress Notes (Deleted)
HPI: FU CAD; last catheterization in September 2008. At that time, she was found to have significant left circumflex disease as well as marginal disease to correlate with perfusion abnormality on her Myoview. However, it was felt that these are small vessels and would be difficult to intervene on either percutaneously or from a surgical standpoint. Her only other disease was in the PDA at 50-70%. Nuclear study May 2016 showed ejection fraction 76% and inferior lateral ischemia consistent with known circumflex disease.Carotid dopplers11/20 revealed 40-59 bilateral stenosis. Since I last saw her,  Current Outpatient Medications  Medication Sig Dispense Refill  . acetaminophen (TYLENOL) 500 MG tablet Take 1,000 mg by mouth every 6 (six) hours as needed for mild pain or moderate pain.    Marland Kitchen alendronate (FOSAMAX) 70 MG tablet Take by mouth.    . ALPRAZolam (XANAX) 0.25 MG tablet Take 0.25 mg by mouth 3 (three) times daily as needed.    Marland Kitchen amLODipine (NORVASC) 10 MG tablet Take 10 mg by mouth daily.      Marland Kitchen aspirin 81 MG tablet Take 81 mg by mouth every morning.     . calcitonin, salmon, (MIACALCIN/FORTICAL) 200 UNIT/ACT nasal spray 1 spray daily.    . cyanocobalamin (,VITAMIN B-12,) 1000 MCG/ML injection Inject 1,000 mcg into the muscle every 30 (thirty) days.    . diazepam (VALIUM) 10 MG tablet Take 10 mg by mouth every 6 (six) hours as needed. Takes 3-4 times per day as needed for being upset. Always takes 1 tablet at bedtime    . docusate sodium (COLACE) 100 MG capsule Take 100 mg by mouth 2 (two) times daily.    . fluticasone (FLONASE) 50 MCG/ACT nasal spray SMARTSIG:1 Spray(s) Both Nares Twice Daily PRN    . furosemide (LASIX) 20 MG tablet Take 20 mg by mouth as needed. For decreasing your potassium level and for fluid retention.    . gabapentin (NEURONTIN) 100 MG capsule Take 1 capsule (100 mg total) by mouth 3 (three) times daily. 90 capsule 2  . glipiZIDE (GLUCOTROL XL) 10 MG 24 hr  tablet Take 10 mg by mouth 2 (two) times daily.     Marland Kitchen glucose blood (ONETOUCH ULTRA) test strip     . hydrALAZINE (APRESOLINE) 25 MG tablet Take 25 mg by mouth 3 (three) times daily.    . insulin glargine (LANTUS) 100 UNIT/ML injection Inject 20-50 Units into the skin at bedtime. Adjusts amount based on her blood sugar levels    . isosorbide mononitrate (IMDUR) 30 MG 24 hr tablet Take 30 mg by mouth daily.     . Melatonin 3 MG TABS Take 3 mg by mouth at bedtime as needed (sleep).     . metoprolol tartrate (LOPRESSOR) 50 MG tablet Take 50 mg by mouth 2 (two) times daily.    . nitroGLYCERIN (NITROSTAT) 0.4 MG SL tablet Place 0.4 mg under the tongue every 5 (five) minutes x 3 doses as needed (last dose 1 week ago 03/05/2015). For chest pain    . ONETOUCH ULTRA test strip USE TO TEST 4 TIMES3DAILY.    Marland Kitchen OVER THE COUNTER MEDICATION Take 1 capsule by mouth daily. MEGA RED KRILL OIL    . pantoprazole (PROTONIX) 40 MG tablet Take 40 mg by mouth daily.    Marland Kitchen PROLIA 60 MG/ML SOSY injection     . rosuvastatin (CRESTOR) 40 MG tablet TAKE ONE TABLET BY MOUTH AT BEDTIME. 30 tablet 11  . sodium polystyrene (KAYEXALATE) powder Take by  mouth.     No current facility-administered medications for this visit.     Past Medical History:  Diagnosis Date  . Anxiety disorder   . Cat allergies   . Cerebrovascular disease    CVA-left sided weakness  . Chronic kidney disease (CKD), stage III (moderate) 08/04/2011  . Coronary atherosclerosis of unspecified type of vessel, native or graft    MI x2  . Depression   . Diabetes mellitus without complication ([place])   . Glaucoma   . Hemorrhoids   . Hyperlipidemia, mixed   . Kidney stones   . Polyclonal gammopathy 01/29/2012  . PVD (peripheral vascular disease) ([place])   . Unspecified essential hypertension     Past Surgical History:  Procedure Laterality Date  . ABDOMINAL HYSTERECTOMY    . CHOLECYSTECTOMY    . COLONOSCOPY  09/26/08   IDP:[id number] rectum/diminutive  rectosigmoid polyp/pan colonic diverticula  . COLONOSCOPY N/A 03/14/2015   Procedure: COLONOSCOPY;  Surgeon: Daneil Dolin, MD;  Location: [facility];  Service: Endoscopy;  Laterality: N/A;  1030 - moved to 10:15 - office to notify  . KIDNEY STONE SURGERY    . laparoectomy    . polyectomy    . TUBAL LIGATION      Social History   Socioeconomic History  . Marital status: Married    Spouse name: Not on file  . Number of children: Not on file  . Years of education: Not on file  . Highest education level: Not on file  Occupational History  . Occupation: Retired  Tobacco Use  . Smoking status: Former Research scientist (life sciences)  . Smokeless tobacco: Never Used  Substance and Sexual Activity  . Alcohol use: No  . Drug use: No  . Sexual activity: Not on file  Other Topics Concern  . Not on file  Social History Narrative  . Not on file   Social Determinants of Health   Financial Resource Strain:   . Difficulty of Paying Living Expenses:   Food Insecurity:   . Worried About Charity fundraiser in the Last Year:   . Arboriculturist in the Last Year:   Transportation Needs:   . Film/video editor (Medical):   Marland Kitchen Lack of Transportation (Non-Medical):   Physical Activity:   . Days of Exercise per Week:   . Minutes of Exercise per Session:   Stress:   . Feeling of Stress :   Social Connections:   . Frequency of Communication with Friends and Family:   . Frequency of Social Gatherings with Friends and Family:   . Attends Religious Services:   . Active Member of Clubs or Organizations:   . Attends Archivist Meetings:   Marland Kitchen Marital Status:   Intimate Partner Violence:   . Fear of Current or Ex-Partner:   . Emotionally Abused:   Marland Kitchen Physically Abused:   . Sexually Abused:     Family History  Problem Relation Age of Onset  . Heart attack Father   . Kidney disease Mother   . Colon cancer Other        aunt  . Colon polyps Son   . Colon polyps Other        siblings    ROS:  no fevers or chills, productive cough, hemoptysis, dysphasia, odynophagia, melena, hematochezia, dysuria, hematuria, rash, seizure activity, orthopnea, PND, pedal edema, claudication. Remaining systems are negative.  Physical Exam: Well-developed well-nourished in no acute distress.  Skin is warm and dry.  HEENT is  normal.  Neck is supple.  Chest is clear to auscultation with normal expansion.  Cardiovascular exam is regular rate and rhythm.  Abdominal exam nontender or distended. No masses palpated. Extremities show no edema. neuro grossly intact  ECG- personally reviewed  A/P  1 coronary artery disease-plan to continue medical therapy with aspirin and statin.  She denies recurrent chest pain.  2 hypertension-patient's blood pressure is controlled today.  Continue present medical regimen.  3 hyperlipidemia-continue statin.  4 carotid artery disease-she will need follow-up carotid Dopplers November 2021.  5 chronic stage IV kidney disease-followed by nephrology. Kirk Ruths, MD

## 2019-12-19 ENCOUNTER — Ambulatory Visit: Payer: PPO | Admitting: Cardiology

## 2019-12-28 ENCOUNTER — Encounter (HOSPITAL_COMMUNITY): Payer: Self-pay

## 2019-12-28 ENCOUNTER — Emergency Department (HOSPITAL_COMMUNITY)
Admission: EM | Admit: 2019-12-28 | Discharge: 2019-12-28 | Disposition: A | Discharge status: Home / Self Care | Payer: PPO | Attending: Emergency Medicine | Admitting: Emergency Medicine

## 2019-12-28 ENCOUNTER — Other Ambulatory Visit: Payer: Self-pay

## 2019-12-28 ENCOUNTER — Emergency Department (HOSPITAL_COMMUNITY): Payer: PPO

## 2019-12-28 DIAGNOSIS — N183 Chronic kidney disease, stage 3 unspecified: Secondary | ICD-10-CM | POA: Insufficient documentation

## 2019-12-28 DIAGNOSIS — Z7982 Long term (current) use of aspirin: Secondary | ICD-10-CM | POA: Insufficient documentation

## 2019-12-28 DIAGNOSIS — N200 Calculus of kidney: Secondary | ICD-10-CM | POA: Insufficient documentation

## 2019-12-28 DIAGNOSIS — E119 Type 2 diabetes mellitus without complications: Secondary | ICD-10-CM | POA: Diagnosis not present

## 2019-12-28 DIAGNOSIS — Z79899 Other long term (current) drug therapy: Secondary | ICD-10-CM | POA: Insufficient documentation

## 2019-12-28 DIAGNOSIS — Z87891 Personal history of nicotine dependence: Secondary | ICD-10-CM | POA: Diagnosis not present

## 2019-12-28 DIAGNOSIS — I129 Hypertensive chronic kidney disease with stage 1 through stage 4 chronic kidney disease, or unspecified chronic kidney disease: Secondary | ICD-10-CM | POA: Diagnosis not present

## 2019-12-28 DIAGNOSIS — I700 Atherosclerosis of aorta: Secondary | ICD-10-CM | POA: Diagnosis not present

## 2019-12-28 DIAGNOSIS — N210 Calculus in bladder: Secondary | ICD-10-CM | POA: Diagnosis not present

## 2019-12-28 DIAGNOSIS — R109 Unspecified abdominal pain: Secondary | ICD-10-CM | POA: Diagnosis present

## 2019-12-28 DIAGNOSIS — N261 Atrophy of kidney (terminal): Secondary | ICD-10-CM | POA: Diagnosis not present

## 2019-12-28 DIAGNOSIS — Z794 Long term (current) use of insulin: Secondary | ICD-10-CM | POA: Diagnosis not present

## 2019-12-28 DIAGNOSIS — N132 Hydronephrosis with renal and ureteral calculous obstruction: Secondary | ICD-10-CM | POA: Diagnosis not present

## 2019-12-28 LAB — CBC
HCT: 33.8 % — ABNORMAL LOW (ref 36.0–46.0)
Hemoglobin: 10.4 g/dL — ABNORMAL LOW (ref 12.0–15.0)
MCH: 29.6 pg (ref 26.0–34.0)
MCHC: 30.8 g/dL (ref 30.0–36.0)
MCV: 96.3 fL (ref 80.0–100.0)
Platelets: 225 10*3/uL (ref 150–400)
RBC: 3.51 MIL/uL — ABNORMAL LOW (ref 3.87–5.11)
RDW: 14.3 % (ref 11.5–15.5)
WBC: 12.7 10*3/uL — ABNORMAL HIGH (ref 4.0–10.5)
nRBC: 0 % (ref 0.0–0.2)

## 2019-12-28 LAB — URINALYSIS, ROUTINE W REFLEX MICROSCOPIC
Bilirubin Urine: NEGATIVE
Glucose, UA: 50 mg/dL — AB
Ketones, ur: NEGATIVE mg/dL
Nitrite: NEGATIVE
Protein, ur: 100 mg/dL — AB
RBC / HPF: >50 RBC/hpf — ABNORMAL HIGH (ref 0–5)
Specific Gravity, Urine: 1.01 (ref 1.005–1.030)
WBC, UA: >50 WBC/hpf — ABNORMAL HIGH (ref 0–5)
pH: 5 (ref 5.0–8.0)

## 2019-12-28 LAB — COMPREHENSIVE METABOLIC PANEL
ALT: 17 U/L (ref 0–44)
AST: 18 U/L (ref 15–41)
Albumin: 4 g/dL (ref 3.5–5.0)
Alkaline Phosphatase: 56 U/L (ref 38–126)
Anion gap: 12 (ref 5–15)
BUN: 26 mg/dL — ABNORMAL HIGH (ref 8–23)
CO2: 19 mmol/L — ABNORMAL LOW (ref 22–32)
Calcium: 9.6 mg/dL (ref 8.9–10.3)
Chloride: 108 mmol/L (ref 98–111)
Creatinine, Ser: 2.53 mg/dL — ABNORMAL HIGH (ref 0.44–1.00)
GFR calc Af Amer: 20 mL/min — ABNORMAL LOW (ref >60)
GFR calc non Af Amer: 17 mL/min — ABNORMAL LOW (ref >60)
Glucose, Bld: 203 mg/dL — ABNORMAL HIGH (ref 70–99)
Potassium: 4.2 mmol/L (ref 3.5–5.1)
Sodium: 139 mmol/L (ref 135–145)
Total Bilirubin: 1 mg/dL (ref 0.3–1.2)
Total Protein: 7.1 g/dL (ref 6.5–8.1)

## 2019-12-28 LAB — LIPASE, BLOOD: Lipase: 38 U/L (ref 11–51)

## 2019-12-28 IMAGING — CT CT RENAL STONE PROTOCOL
2 of 4 series · 14 of 46 positions shown, 16 images · non-contrast
Comparison: [DATE]

CLINICAL DATA: Right-sided abdominal pain, nausea, history of
kidney stones

EXAM:
CT ABDOMEN AND PELVIS WITHOUT CONTRAST
TECHNIQUE: Multidetector CT imaging of the abdomen and pelvis was performed
following the standard protocol without IV contrast.

[Series 2: axial st · axial · 0.69mm/px · z∈[+745,+1154]mm · 11 of 94 slices shown, 13 images]
[im 6/94  soft-tissue]
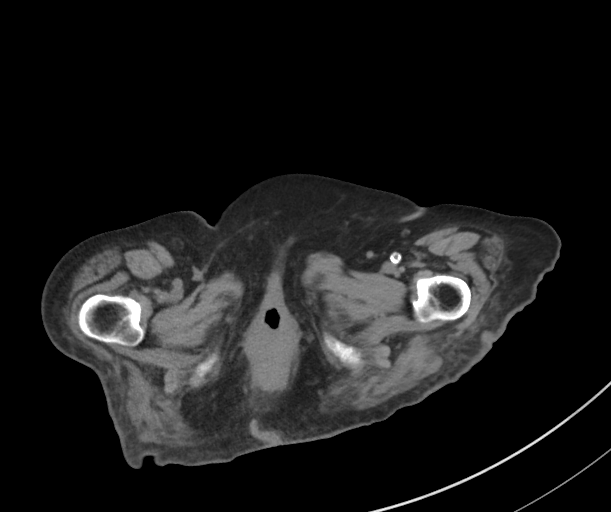
[im 6/94  bone]
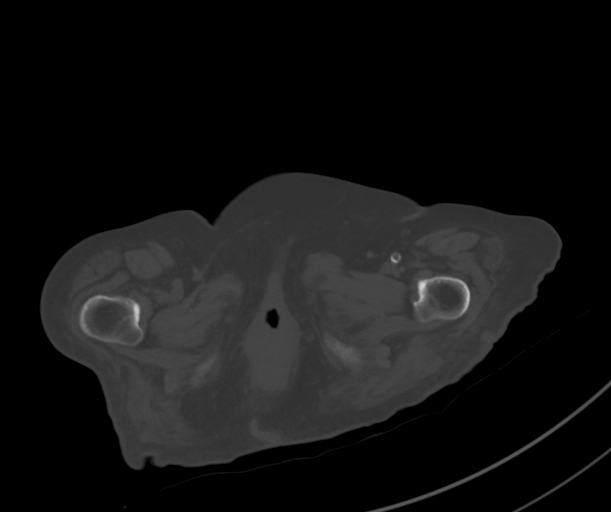
[im 18/94  soft-tissue]
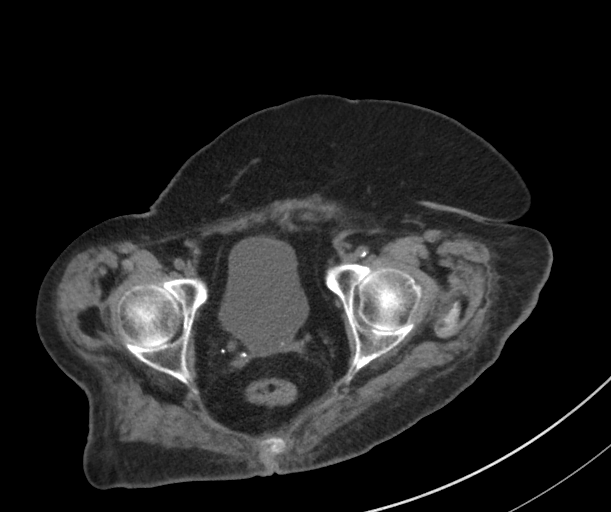
[im 24/94  soft-tissue]
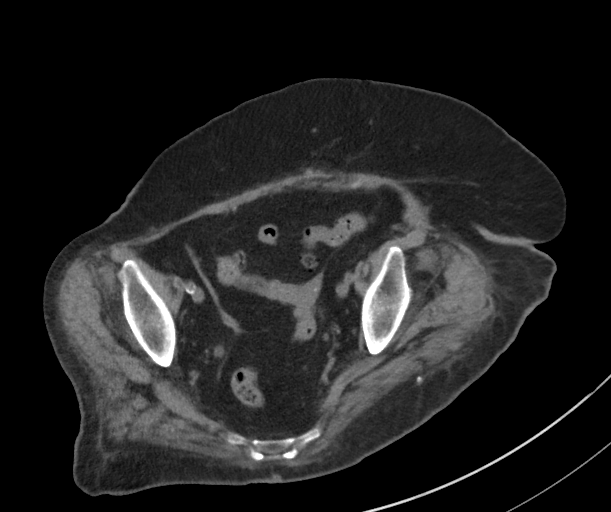
[im 30/94  soft-tissue]
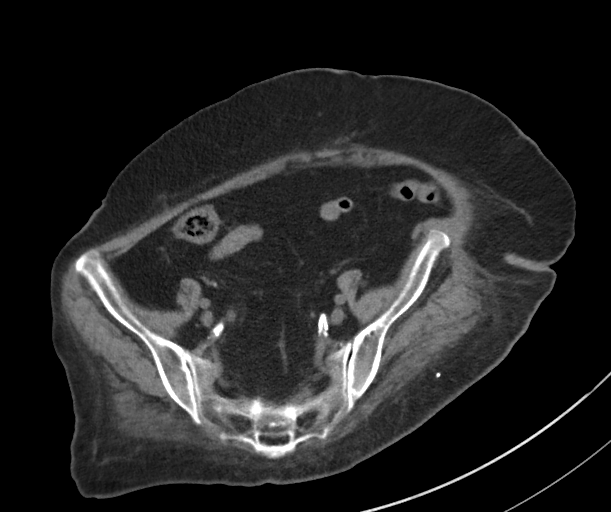
[im 41/94  soft-tissue]
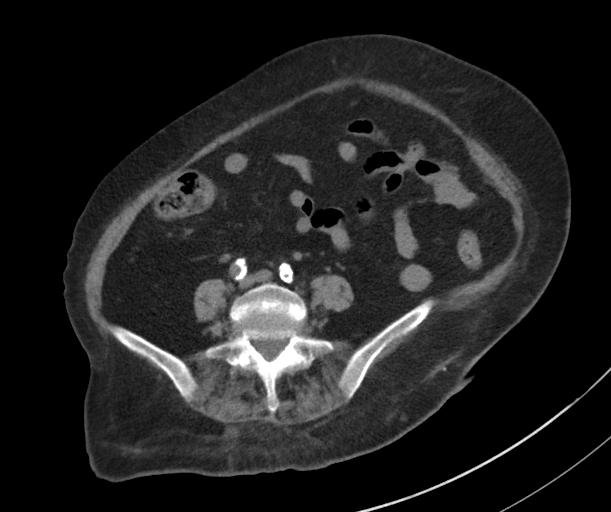
[im 47/94  soft-tissue]
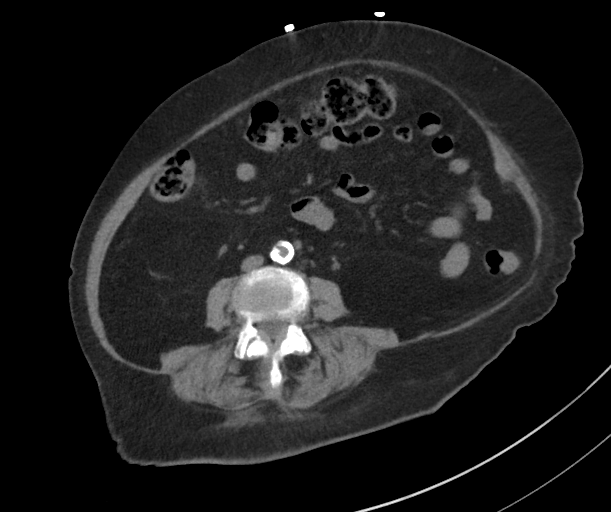
[im 53/94  soft-tissue]
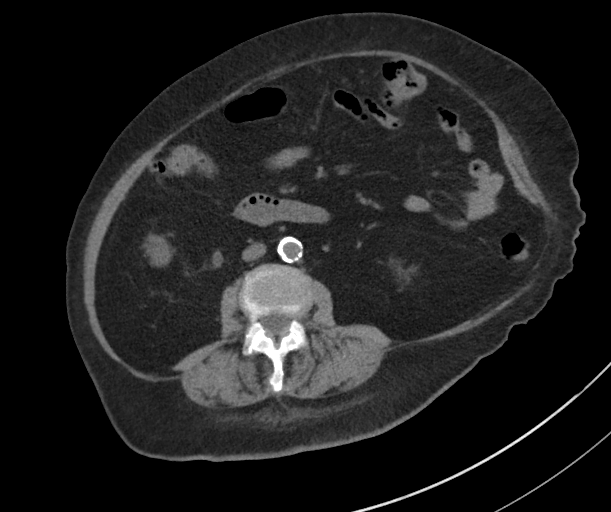
[im 64/94  soft-tissue]
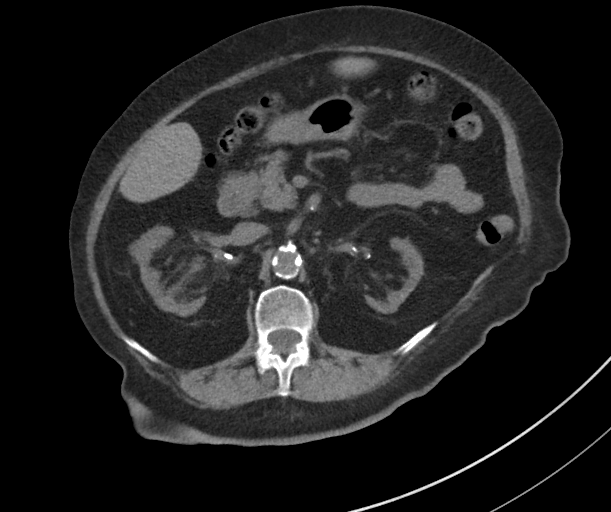
[im 70/94  soft-tissue]
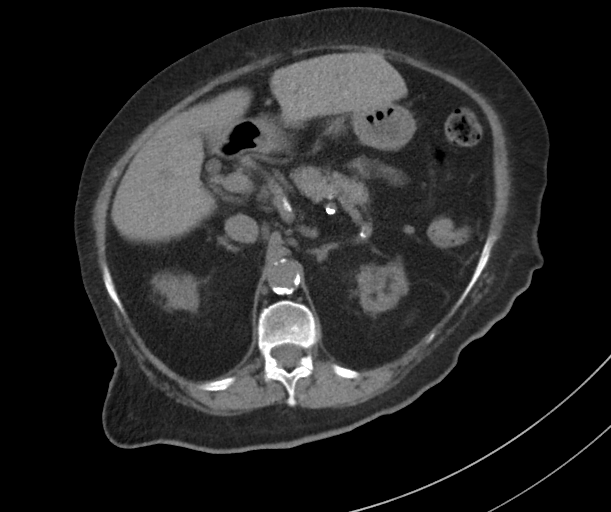
[im 70/94  bone]
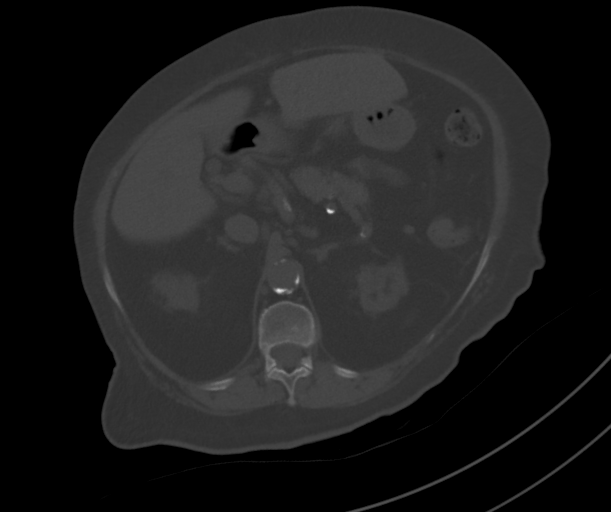
[im 76/94  soft-tissue]
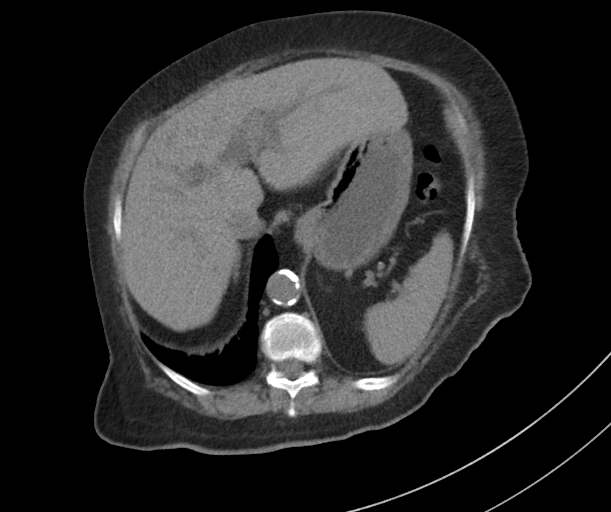
[im 88/94  soft-tissue]
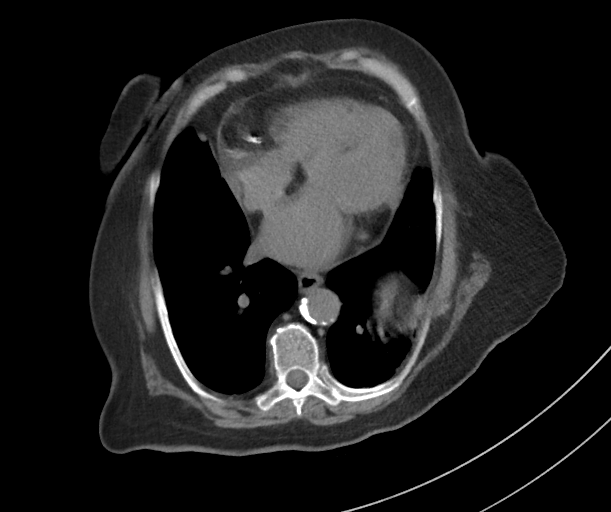

[Series 5: coronal st · coronal · 0.86mm/px · 3 of 114 slices shown]
[im 38/114  soft-tissue]
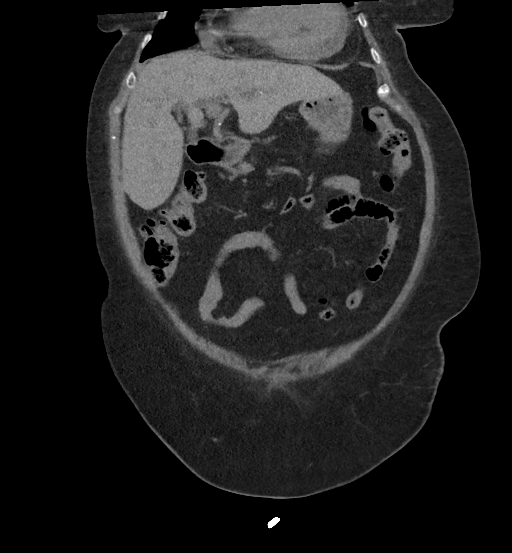
[im 51/114  soft-tissue]
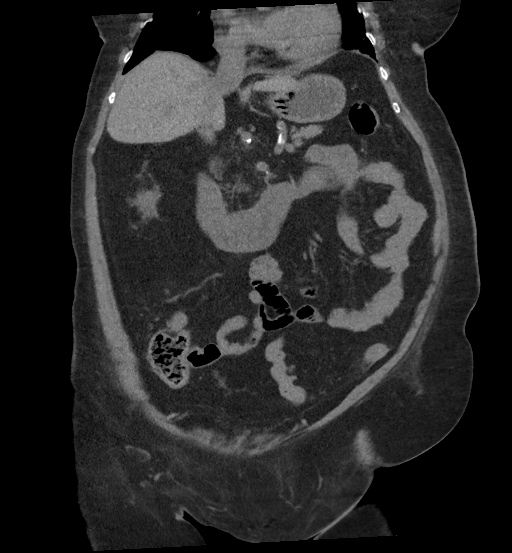
[im 63/114  soft-tissue]
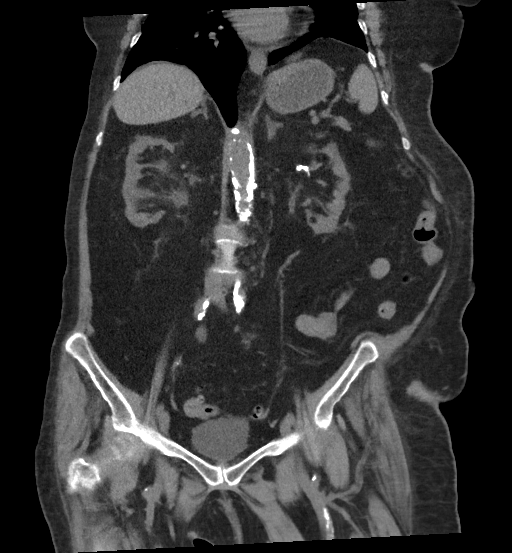

[14 of 46 positions shown; findings below may reference images not displayed]

FINDINGS: Lower chest: No acute abnormality. Coronary artery calcifications.
Scarring of the bilateral lung bases.

Hepatobiliary: No solid liver abnormality is seen. No gallstones,
gallbladder wall thickening, or biliary dilatation.

Pancreas: Unremarkable. No pancreatic ductal dilatation or
surrounding inflammatory changes.

Spleen: Normal in size without significant abnormality.

Adrenals/Urinary Tract: Adrenal glands are unremarkable. Atrophic
appearance of the bilateral kidneys. There is mild right
hydronephrosis and hydroureter to the ureterovesicular junction with
fat stranding about the renal pelvis (series 2, image 35). There is
a very tiny calculus in the dependent urinary bladder (series 2,
image 77). Small nonobstructive calculus of the superior pole of the
left kidney.

Stomach/Bowel: Stomach is within normal limits. Appendix appears
normal. No evidence of bowel wall thickening, distention, or
inflammatory changes.

Vascular/Lymphatic: Aortic atherosclerosis. No enlarged abdominal or
pelvic lymph nodes.

Reproductive: Status post hysterectomy.

Other: No abdominal wall hernia or abnormality. No abdominopelvic
ascites.

Musculoskeletal: No acute or significant osseous findings.
IMPRESSION: 1. There is mild right hydronephrosis and hydroureter to the
ureterovesicular junction with fat stranding about the renal pelvis.
There is a very tiny calculus in the dependent urinary bladder.
Findings are consistent with recently passed calculus.
2. Small additional nonobstructive calculus of the superior pole of
the left kidney.
3. Coronary artery disease.  Aortic Atherosclerosis ([3P]-[3P]).

## 2019-12-28 MED ORDER — HYDROCODONE-ACETAMINOPHEN 5-325 MG PO TABS: 1.0000 | ORAL_TABLET | Freq: Four times a day (QID) | ORAL | 0 refills | Status: AC | PRN

## 2019-12-28 MED ORDER — ONDANSETRON 4 MG PO TBDP: 4.0000 mg | ORAL_TABLET | Freq: Three times a day (TID) | ORAL | 0 refills | Status: AC | PRN

## 2019-12-28 MED ORDER — ONDANSETRON HCL 4 MG/2ML IJ SOLN
4.0000 mg | Freq: Once | INTRAMUSCULAR | Status: AC
  Administered 2019-12-28: 4 mg via INTRAVENOUS
  Filled 2019-12-28: qty 2

## 2019-12-28 MED ORDER — FENTANYL CITRATE (PF) 100 MCG/2ML IJ SOLN
50.0000 ug | Freq: Once | INTRAMUSCULAR | Status: AC
  Administered 2019-12-28: 50 ug via INTRAVENOUS
  Filled 2019-12-28: qty 2

## 2019-12-28 NOTE — Discharge Instructions (Addendum)
You have a very small kidney stone that has passed through your right ureter and is currently in your bladder which you should be able to pass without any difficulty.  Make sure you are drinking plenty of fluids to flush out your bladder and make sure that this does pass without any complications.  Use the urine strainer so you will know when it is gone.  Follow-up with Dr. Gerarda Fraction if you have any worsening or persistent symptoms, of course returning here if you have symptoms that are after hours.  You have been given a small prescription for pain medication if needed, do not drive within 4 hours of taking this medication as it may make you drowsy.

## 2019-12-28 NOTE — ED Triage Notes (Signed)
Pt presents to ED with complaints of right sided abdominal pain since 0900 this am and nausea.

## 2019-12-28 NOTE — ED Provider Notes (Signed)
[facility] EMERGENCY DEPARTMENT Provider Note   CSN: [id number] Arrival date & time: 12/28/19  1058     History Chief Complaint  Patient presents with  . Abdominal Pain    [name] is a 79 y.o. female with a history of DM, HTN, CKD stage III with cystic kidney disease and history of kidney stones presenting with sudden onset of right flank pain with radiation into her right groin which started around 8:30 am today.  She also endorses nausea without emesis, denies fevers, dysuria, hematuria or increased urinary frequency.  She has had no PO intake today and has found no alleviators for her symptoms.  Of note, CT abd 08/02/11 negative for aortic aneurysm.   HPI     Past Medical History:  Diagnosis Date  . Anxiety disorder   . Cat allergies   . Cerebrovascular disease    CVA-left sided weakness  . Chronic kidney disease (CKD), stage III (moderate) 08/04/2011  . Coronary atherosclerosis of unspecified type of vessel, native or graft    MI x2  . Depression   . Diabetes mellitus without complication ([place])   . Glaucoma   . Hemorrhoids   . Hyperlipidemia, mixed   . Kidney stones   . Polyclonal gammopathy 01/29/2012  . PVD (peripheral vascular disease) ([place])   . Unspecified essential hypertension     Patient Active Problem List   Diagnosis Date Noted  . History of colonic polyps   . Diverticulosis of colon without hemorrhage   . Hx of adenomatous colonic polyps 03/05/2015  . Polyclonal gammopathy 01/29/2012  . Chronic kidney disease (CKD), stage III (moderate) 08/04/2011  . Kidney stones 08/04/2011  . Pyelonephritis, acute 08/02/2011  . Dehydration 08/02/2011  . Nausea 08/02/2011  . DM type 2, uncontrolled, with renal complications ([place]) 23/76/2831  . Incontinence 08/02/2011  . Obesity 08/02/2011  . ARF (acute renal failure) ([place]) 08/02/2011  . CAROTID STENOSIS 09/20/2008  . CHEST PAIN 09/20/2008  . DM 09/19/2008  . HYPERLIPIDEMIA-MIXED 09/19/2008  . Essential  hypertension 09/19/2008  . Coronary atherosclerosis 09/19/2008  . PVD 09/19/2008  . RENAL INSUFFICIENCY 09/19/2008  . CONTRAST DYE ALLERGY, HX OF 09/19/2008  . HEMORRHOIDS 09/12/2008  . Constipation 09/12/2008  . HEMATOCHEZIA 09/12/2008    Past Surgical History:  Procedure Laterality Date  . ABDOMINAL HYSTERECTOMY    . CHOLECYSTECTOMY    . COLONOSCOPY  09/26/08   DVV:OHYWVP rectum/diminutive rectosigmoid polyp/pan colonic diverticula  . COLONOSCOPY N/A 03/14/2015   Procedure: COLONOSCOPY;  Surgeon: Daneil Dolin, MD;  Location: [facility];  Service: Endoscopy;  Laterality: N/A;  1030 - moved to 10:15 - office to notify  . KIDNEY STONE SURGERY    . laparoectomy    . polyectomy    . TUBAL LIGATION       OB History    Gravida  3   Para  2   Term  2   Preterm      AB  1   Living        SAB  1   TAB      Ectopic      Multiple      Live Births              Family History  Problem Relation Age of Onset  . Heart attack Father   . Kidney disease Mother   . Colon cancer Other        aunt  . Colon polyps Son   . Colon  polyps Other        siblings    Social History   Tobacco Use  . Smoking status: Former Research scientist (life sciences)  . Smokeless tobacco: Never Used  Substance Use Topics  . Alcohol use: No  . Drug use: No    Home Medications Prior to Admission medications   Medication Sig Start Date End Date Taking? Authorizing Provider  acetaminophen (TYLENOL) 500 MG tablet Take 1,000 mg by mouth every 6 (six) hours as needed for mild pain or moderate pain.    [provider]  alendronate (FOSAMAX) 70 MG tablet Take by mouth.    [provider]  ALPRAZolam Duanne Moron) 0.25 MG tablet Take 0.25 mg by mouth 3 (three) times daily as needed. 07/22/19   [provider]  amLODipine (NORVASC) 10 MG tablet Take 10 mg by mouth daily.      [provider]  aspirin 81 MG tablet Take 81 mg by mouth every morning.     [provider]    calcitonin, salmon, (MIACALCIN/FORTICAL) 200 UNIT/ACT nasal spray 1 spray daily. 04/14/19   [provider]  cyanocobalamin (,VITAMIN B-12,) 1000 MCG/ML injection Inject 1,000 mcg into the muscle every 30 (thirty) days.    [provider]  diazepam (VALIUM) 10 MG tablet Take 10 mg by mouth every 6 (six) hours as needed. Takes 3-4 times per day as needed for being upset. Always takes 1 tablet at bedtime    [provider]  docusate sodium (COLACE) 100 MG capsule Take 100 mg by mouth 2 (two) times daily.    [provider]  fluticasone (FLONASE) 50 MCG/ACT nasal spray SMARTSIG:1 Spray(s) Both Nares Twice Daily PRN 07/22/19   [provider]  furosemide (LASIX) 20 MG tablet Take 20 mg by mouth as needed. For decreasing your potassium level and for fluid retention. 08/05/11 04/16/17  Rexene Alberts, MD  gabapentin (NEURONTIN) 100 MG capsule Take 1 capsule (100 mg total) by mouth 3 (three) times daily. 10/26/17   Carole Civil, MD  glipiZIDE (GLUCOTROL XL) 10 MG 24 hr tablet Take 10 mg by mouth 2 (two) times daily.     [provider]  glucose blood (ONETOUCH ULTRA) test strip  02/18/18   [provider]  hydrALAZINE (APRESOLINE) 25 MG tablet Take 25 mg by mouth 3 (three) times daily.    [provider]  insulin glargine (LANTUS) 100 UNIT/ML injection Inject 20-50 Units into the skin at bedtime. Adjusts amount based on her blood sugar levels    [provider]  isosorbide mononitrate (IMDUR) 30 MG 24 hr tablet Take 30 mg by mouth daily.     [provider]  Melatonin 3 MG TABS Take 3 mg by mouth at bedtime as needed (sleep).     [provider]  metoprolol tartrate (LOPRESSOR) 50 MG tablet Take 50 mg by mouth 2 (two) times daily.    [provider]  nitroGLYCERIN (NITROSTAT) 0.4 MG SL tablet Place 0.4 mg under the tongue every 5 (five) minutes x 3 doses as needed (last dose 1 week ago 03/05/2015).  For chest pain    [provider]  ONETOUCH ULTRA test strip USE TO TEST 4 TIMES3DAILY. 07/28/19   [provider]  OVER THE COUNTER MEDICATION Take 1 capsule by mouth daily. [place]    [provider]  pantoprazole (PROTONIX) 40 MG tablet Take 40 mg by mouth daily. 07/22/19   [provider]  PROLIA 60 MG/ML SOSY  injection  07/22/19   [provider]  rosuvastatin (CRESTOR) 40 MG tablet TAKE ONE TABLET BY MOUTH AT BEDTIME. 03/10/17   Lelon Perla, MD  sodium polystyrene (KAYEXALATE) powder Take by mouth. 08/25/11   [provider]    Allergies    Contrast media [iodinated diagnostic agents], Hydromorphone hcl, Biaxin [clarithromycin], Cephalexin, Penicillins, Sulfonamide derivatives, and Sulfa antibiotics  Review of Systems   Review of Systems  Constitutional: Negative for chills and fever.  HENT: Negative for congestion and sore throat.   Eyes: Negative.   Respiratory: Negative for chest tightness and shortness of breath.   Cardiovascular: Negative for chest pain.  Gastrointestinal: Positive for abdominal pain and nausea. Negative for vomiting.  Genitourinary: Positive for flank pain. Negative for dysuria, hematuria and urgency.  Musculoskeletal: Negative for arthralgias, joint swelling and neck pain.  Skin: Negative.  Negative for rash and wound.  Neurological: Negative for dizziness, weakness, light-headedness, numbness and headaches.  Psychiatric/Behavioral: Negative.     Physical Exam Updated Vital Signs BP (!) 155/45   Pulse 68   Temp 97.8 F (36.6 C)   Resp 18   Ht 5\' 1"  (1.549 m)   Wt 65.8 kg   SpO2 96%   BMI 27.40 kg/m   Physical Exam Vitals and nursing note reviewed.  Constitutional:      Appearance: She is well-developed.  HENT:     Head: Normocephalic and atraumatic.  Eyes:     Conjunctiva/sclera: Conjunctivae normal.  Cardiovascular:     Rate and Rhythm: Normal rate and regular rhythm.      Heart sounds: Normal heart sounds.  Pulmonary:     Effort: Pulmonary effort is normal.     Breath sounds: Normal breath sounds. No wheezing.  Abdominal:     General: Abdomen is protuberant. Bowel sounds are normal.     Palpations: Abdomen is soft.     Tenderness: There is abdominal tenderness in the right upper quadrant and epigastric area. There is right CVA tenderness. There is no guarding.  Musculoskeletal:        General: Normal range of motion.     Cervical back: Normal range of motion.  Skin:    General: Skin is warm and dry.  Neurological:     Mental Status: She is alert.     ED Results / Procedures / Treatments   Labs (all labs ordered are listed, but only abnormal results are displayed) Labs Reviewed  COMPREHENSIVE METABOLIC PANEL - Abnormal; Notable for the following components:      Result Value   CO2 19 (*)    Glucose, Bld 203 (*)    BUN 26 (*)    Creatinine, Ser 2.53 (*)    GFR calc non Af Amer 17 (*)    GFR calc Af Amer 20 (*)    All other components within normal limits  CBC - Abnormal; Notable for the following components:   WBC 12.7 (*)    RBC 3.51 (*)    Hemoglobin 10.4 (*)    HCT 33.8 (*)    All other components within normal limits  URINALYSIS, ROUTINE W REFLEX MICROSCOPIC - Abnormal; Notable for the following components:   APPearance CLOUDY (*)    Glucose, UA 50 (*)    Hgb urine dipstick LARGE (*)    Protein, ur 100 (*)    Leukocytes,Ua LARGE (*)    RBC / HPF >50 (*)    WBC, UA >50 (*)    Bacteria, UA RARE (*)  All other components within normal limits  URINE CULTURE  LIPASE, BLOOD    EKG None  Radiology CT Renal Stone Study  Result Date: 12/28/2019 CLINICAL DATA:  Right-sided abdominal pain, nausea, history of kidney stones EXAM: CT ABDOMEN AND PELVIS WITHOUT CONTRAST TECHNIQUE: Multidetector CT imaging of the abdomen and pelvis was performed following the standard protocol without IV contrast. COMPARISON:  11/03/2014 FINDINGS: Lower  chest: No acute abnormality. Coronary artery calcifications. Scarring of the bilateral lung bases. Hepatobiliary: No solid liver abnormality is seen. No gallstones, gallbladder wall thickening, or biliary dilatation. Pancreas: Unremarkable. No pancreatic ductal dilatation or surrounding inflammatory changes. Spleen: Normal in size without significant abnormality. Adrenals/Urinary Tract: Adrenal glands are unremarkable. Atrophic appearance of the bilateral kidneys. There is mild right hydronephrosis and hydroureter to the ureterovesicular junction with fat stranding about the renal pelvis (series 2, image 35). There is a very tiny calculus in the dependent urinary bladder (series 2, image 77). Small nonobstructive calculus of the superior pole of the left kidney. Stomach/Bowel: Stomach is within normal limits. Appendix appears normal. No evidence of bowel wall thickening, distention, or inflammatory changes. Vascular/Lymphatic: Aortic atherosclerosis. No enlarged abdominal or pelvic lymph nodes. Reproductive: Status post hysterectomy. Other: No abdominal wall hernia or abnormality. No abdominopelvic ascites. Musculoskeletal: No acute or significant osseous findings. IMPRESSION: 1. There is mild right hydronephrosis and hydroureter to the ureterovesicular junction with fat stranding about the renal pelvis. There is a very tiny calculus in the dependent urinary bladder. Findings are consistent with recently passed calculus. 2. Small additional nonobstructive calculus of the superior pole of the left kidney. 3. Coronary artery disease.  Aortic Atherosclerosis (ICD10-I70.0). Electronically Signed   By: Eddie Candle M.D.   On: 12/28/2019 16:24    Procedures Procedures (including critical care time)  Medications Ordered in ED Medications  ondansetron (ZOFRAN) injection 4 mg (4 mg Intravenous Given 12/28/19 1501)  fentaNYL (SUBLIMAZE) injection 50 mcg (50 mcg Intravenous Given 12/28/19 1502)    ED Course  I have  reviewed the triage vital signs and the nursing notes.  Pertinent labs & imaging results that were available during my care of the patient were reviewed by me and considered in my medical decision making (see chart for details).    MDM Rules/Calculators/A&P                          Patient's labs and imaging were reviewed and discussed with patient and her husband at the bedside.  She has a small kidney stone which is actually in her dependent bladder.  She does endorse significantly improved pain since upon first arrival.  Urine culture has been ordered.  She does have lots of red cells and white cells in her urine, but denies dysuria, negative bacteria and negative nitrites, doubt this is a UTI.  Her renal function is reduced with a creatinine of 2.53.  This is a chronic finding and not significantly worsened today.  She was encouraged to increase her fluid intake.  She was given a small prescription for hydrocodone, caution regarding sedation.  Urine strainer given.  She was advised to follow-up with her PCP and/or return here for any worsened or new symptoms.  Given she no longer has a ureteral stone, doubt any complication from complete passage of this tiny stone. Final Clinical Impression(s) / ED Diagnoses Final diagnoses:  Kidney stone on right side    Rx / DC Orders ED Discharge Orders    None  Evalee Jefferson, PA-C 12/28/19 1729    Margette Fast, MD 12/29/19 1052

## 2019-12-31 LAB — URINE CULTURE: Culture: >=100000 — AB

## 2020-01-01 ENCOUNTER — Telehealth: Payer: Self-pay

## 2020-01-01 NOTE — Telephone Encounter (Signed)
Post ED Visit - Positive Culture Follow-up: Unsuccessful Patient Follow-up  Culture assessed and recommendations reviewed by:  []  Elenor Quinones, Pharm.D. []  Heide Guile, Pharm.D., BCPS AQ-ID []  Parks Neptune, Pharm.D., BCPS []  Alycia Rossetti, Pharm.D., BCPS []  [place], Pharm.D., BCPS, AAHIVP []  Legrand Como, Pharm.D., BCPS, AAHIVP []  Wynell Balloon, PharmD []  Vincenza Hews, PharmD, BCPS Apolonio Schneiders Rumbarger Pharm D Positive urine culture Symptom check May need Bactrim single strength if symptoms to take 1 BID x 3 days with food per Suella Broad [x]  Patient discharged without antimicrobial prescription and treatment is now indicated []  Organism is resistant to prescribed ED discharge antimicrobial []  Patient with positive blood cultures   Unable to contact patient after 3 attempts, letter will be sent to address on file  Genia Del 01/01/2020, 11:04 AM

## 2020-01-01 NOTE — Progress Notes (Signed)
ED Antimicrobial Stewardship Positive Culture Follow Up   [name] is an 79 y.o. female who presented to [facility] on 12/28/2019 with a chief complaint of  Chief Complaint  Patient presents with  . Abdominal Pain    Recent Results (from the past 720 hour(s))  Urine Culture     Status: Abnormal   Collection Time: 12/28/19  2:53 PM   Specimen: Urine, Clean Catch  Result Value Ref Range Status   Specimen Description   Final    URINE, CLEAN CATCH Performed at [facility], [address], [place], [place] [address]    Special Requests   Final    NONE Performed at [facility], [address] [place], [place] [address]    Culture >=100,000 COLONIES/mL KLEBSIELLA PNEUMONIAE (A)  Final   Report Status 12/31/2019 FINAL  Final   Organism ID, Bacteria KLEBSIELLA PNEUMONIAE (A)  Final      Susceptibility   Klebsiella pneumoniae - MIC*    AMPICILLIN RESISTANT Resistant     CEFAZOLIN <=4 SENSITIVE Sensitive     CEFTRIAXONE <=0.25 SENSITIVE Sensitive     CIPROFLOXACIN <=0.25 SENSITIVE Sensitive     GENTAMICIN <=1 SENSITIVE Sensitive     IMIPENEM <=0.25 SENSITIVE Sensitive     NITROFURANTOIN 128 RESISTANT Resistant     TRIMETH/SULFA <=20 SENSITIVE Sensitive     AMPICILLIN/SULBACTAM 4 SENSITIVE Sensitive     PIP/TAZO <=4 SENSITIVE Sensitive     * >=100,000 COLONIES/mL KLEBSIELLA PNEUMONIAE    []  Treated with N/A, organism resistant to prescribed antimicrobial [x]  Patient discharged originally without antimicrobial agent and treatment is now indicated  New antibiotic prescription: If pt has UTI symptoms, start bactrim 1 single-strength tablet PO BID x 3 days. Noted intolerance to sulfa antibiotics. Recommend taking with food to minimize nausea and vomiting.   ED Provider: Suella Broad, PA-C   Jaxin Fulfer, Rande Lawman 01/01/2020, 10:35 AM Clinical Pharmacist Monday - Friday phone -  [phone] Saturday - Sunday phone - [phone]

## 2020-01-11 DIAGNOSIS — E1129 Type 2 diabetes mellitus with other diabetic kidney complication: Secondary | ICD-10-CM | POA: Diagnosis not present

## 2020-01-11 DIAGNOSIS — Z1389 Encounter for screening for other disorder: Secondary | ICD-10-CM | POA: Diagnosis not present

## 2020-01-11 DIAGNOSIS — Z87442 Personal history of urinary calculi: Secondary | ICD-10-CM | POA: Diagnosis not present

## 2020-01-11 DIAGNOSIS — E663 Overweight: Secondary | ICD-10-CM | POA: Diagnosis not present

## 2020-01-11 DIAGNOSIS — I10 Essential (primary) hypertension: Secondary | ICD-10-CM | POA: Diagnosis not present

## 2020-01-11 DIAGNOSIS — Z6827 Body mass index (BMI) 27.0-27.9, adult: Secondary | ICD-10-CM | POA: Diagnosis not present

## 2020-01-11 DIAGNOSIS — Z0001 Encounter for general adult medical examination with abnormal findings: Secondary | ICD-10-CM | POA: Diagnosis not present

## 2020-01-11 DIAGNOSIS — E1151 Type 2 diabetes mellitus with diabetic peripheral angiopathy without gangrene: Secondary | ICD-10-CM | POA: Diagnosis not present

## 2020-01-11 DIAGNOSIS — M1991 Primary osteoarthritis, unspecified site: Secondary | ICD-10-CM | POA: Diagnosis not present

## 2020-01-11 DIAGNOSIS — N200 Calculus of kidney: Secondary | ICD-10-CM | POA: Diagnosis not present

## 2020-02-28 NOTE — Progress Notes (Signed)
HPI: FU CAD; last catheterization in September 2008. At that time, she was found to have significant left circumflex disease as well as marginal disease to correlate with perfusion abnormality on her Myoview. However, it was felt that these are small vessels and would be difficult to intervene on either percutaneously or from a surgical standpoint. Her only other disease was in the PDA at 50-70%. Nuclear study May 2016 showed ejection fraction 76% and inferior lateral ischemia consistent with known circumflex disease.Carotid dopplers11/20 revealed 40-59 right and 1 to 39% left.Since I last saw her,she denies dyspnea, palpitations or syncope.  She has some chest discomfort when she gets upset which is longstanding.  Current Outpatient Medications  Medication Sig Dispense Refill   acetaminophen (TYLENOL) 500 MG tablet Take 1,000 mg by mouth every 6 (six) hours as needed for mild pain or moderate pain.     alendronate (FOSAMAX) 70 MG tablet Take by mouth.     ALPRAZolam (XANAX) 0.25 MG tablet Take 0.25 mg by mouth 3 (three) times daily as needed.     amLODipine (NORVASC) 10 MG tablet Take 10 mg by mouth daily.       aspirin 81 MG tablet Take 81 mg by mouth every morning.      calcitonin, salmon, (MIACALCIN/FORTICAL) 200 UNIT/ACT nasal spray 1 spray daily.     cyanocobalamin (,VITAMIN B-12,) 1000 MCG/ML injection Inject 1,000 mcg into the muscle every 30 (thirty) days.     diazepam (VALIUM) 10 MG tablet Take 10 mg by mouth every 6 (six) hours as needed. Takes 3-4 times per day as needed for being upset. Always takes 1 tablet at bedtime     docusate sodium (COLACE) 100 MG capsule Take 100 mg by mouth 2 (two) times daily.     fluticasone (FLONASE) 50 MCG/ACT nasal spray SMARTSIG:1 Spray(s) Both Nares Twice Daily PRN     furosemide (LASIX) 20 MG tablet Take 20 mg by mouth as needed. For decreasing your potassium level and for fluid retention.     gabapentin (NEURONTIN) 100 MG  capsule Take 1 capsule (100 mg total) by mouth 3 (three) times daily. 90 capsule 2   glipiZIDE (GLUCOTROL XL) 10 MG 24 hr tablet Take 10 mg by mouth 2 (two) times daily.      glucose blood (ONETOUCH ULTRA) test strip      hydrALAZINE (APRESOLINE) 25 MG tablet Take 25 mg by mouth 3 (three) times daily.     HYDROcodone-acetaminophen (NORCO/VICODIN) 5-325 MG tablet Take 1 tablet by mouth every 6 (six) hours as needed for moderate pain. 8 tablet 0   insulin glargine (LANTUS) 100 UNIT/ML injection Inject 20-50 Units into the skin at bedtime. Adjusts amount based on her blood sugar levels     isosorbide mononitrate (IMDUR) 30 MG 24 hr tablet Take 30 mg by mouth daily.      Melatonin 3 MG TABS Take 3 mg by mouth at bedtime as needed (sleep).      metoprolol tartrate (LOPRESSOR) 50 MG tablet Take 50 mg by mouth 2 (two) times daily.     nitroGLYCERIN (NITROSTAT) 0.4 MG SL tablet Place 0.4 mg under the tongue every 5 (five) minutes x 3 doses as needed (last dose 1 week ago 03/05/2015). For chest pain     ondansetron (ZOFRAN ODT) 4 MG disintegrating tablet Take 1 tablet (4 mg total) by mouth every 8 (eight) hours as needed for nausea or vomiting. 12 tablet 0   ONETOUCH ULTRA test strip  USE TO TEST 4 TIMES3DAILY.     OVER THE COUNTER MEDICATION Take 1 capsule by mouth daily. [place] RED KRILL OIL     pantoprazole (PROTONIX) 40 MG tablet Take 40 mg by mouth daily.     PROLIA 60 MG/ML SOSY injection      rosuvastatin (CRESTOR) 40 MG tablet TAKE ONE TABLET BY MOUTH AT BEDTIME. 30 tablet 11   sodium polystyrene (KAYEXALATE) powder Take by mouth.     No current facility-administered medications for this visit.     Past Medical History:  Diagnosis Date   Anxiety disorder    Cat allergies    Cerebrovascular disease    CVA-left sided weakness   Chronic kidney disease (CKD), stage III (moderate) 08/04/2011   Coronary atherosclerosis of unspecified type of vessel, native or graft    MI x2    Depression    Diabetes mellitus without complication ([place])    Glaucoma    Hemorrhoids    Hyperlipidemia, mixed    Kidney stones    Polyclonal gammopathy 01/29/2012   PVD (peripheral vascular disease) ([place])    Unspecified essential hypertension     Past Surgical History:  Procedure Laterality Date   ABDOMINAL HYSTERECTOMY     CHOLECYSTECTOMY     COLONOSCOPY  09/26/08   DDU:KGURKY rectum/diminutive rectosigmoid polyp/pan colonic diverticula   COLONOSCOPY N/A 03/14/2015   Procedure: COLONOSCOPY;  Surgeon: Daneil Dolin, MD;  Location: [facility];  Service: Endoscopy;  Laterality: N/A;  1030 - moved to 10:15 - office to notify   KIDNEY STONE SURGERY     laparoectomy     polyectomy     TUBAL LIGATION      Social History   Socioeconomic History   Marital status: Married    Spouse name: Not on file   Number of children: Not on file   Years of education: Not on file   Highest education level: Not on file  Occupational History   Occupation: Retired  Tobacco Use   Smoking status: Former Smoker   Smokeless tobacco: Never Used  Substance and Sexual Activity   Alcohol use: No   Drug use: No   Sexual activity: Not on file  Other Topics Concern   Not on file  Social History Narrative   Not on file   Social Determinants of Health   Financial Resource Strain:    Difficulty of Paying Living Expenses: Not on file  Food Insecurity:    Worried About Charity fundraiser in the Last Year: Not on file   [place] in the Last Year: Not on file  Transportation Needs:    Lack of Transportation (Medical): Not on file   Lack of Transportation (Non-Medical): Not on file  Physical Activity:    Days of Exercise per Week: Not on file   Minutes of Exercise per Session: Not on file  Stress:    Feeling of Stress : Not on file  Social Connections:    Frequency of Communication with Friends and Family: Not on file   Frequency of Social Gatherings  with Friends and Family: Not on file   Attends Religious Services: Not on file   Active Member of Clubs or Organizations: Not on file   Attends Archivist Meetings: Not on file   Marital Status: Not on file  Intimate Partner Violence:    Fear of Current or Ex-Partner: Not on file   Emotionally Abused: Not on file   Physically Abused: Not  on file   Sexually Abused: Not on file    Family History  Problem Relation Age of Onset   Heart attack Father    Kidney disease Mother    Colon cancer Other        aunt   Colon polyps Son    Colon polyps Other        siblings    ROS: Some knee pain but no fevers or chills, productive cough, hemoptysis, dysphasia, odynophagia, melena, hematochezia, dysuria, hematuria, rash, seizure activity, orthopnea, PND, pedal edema, claudication. Remaining systems are negative.  Physical Exam: Well-developed well-nourished in no acute distress.  Skin is warm and dry.  HEENT is normal.  Neck is supple.  Chest is clear to auscultation with normal expansion.  Cardiovascular exam is regular rate and rhythm.  Abdominal exam nontender or distended. No masses palpated. Extremities show no edema. neuro grossly intact  ECG-sinus bradycardia, first-degree AV block, low voltage, nonspecific ST changes.  Personally reviewed  A/P  1 coronary artery disease-plan to continue aspirin and statin.  Previous nuclear study did show ischemia in the distribution of her known coronary disease.  We have elected to treat this medically particularly in light of baseline renal insufficiency and risk of contrast nephropathy if cardiac catheterization pursued.  She also would rather be conservative.  2 hypertension-patient's blood pressure is controlled.  Continue present medications.  3 hyperlipidemia-continue statin.  4 carotid artery disease-we will arrange follow-up carotid Dopplers.  5 chronic stage IV kidney disease-monitored by nephrology.  Kirk Ruths, MD

## 2020-02-29 DIAGNOSIS — E538 Deficiency of other specified B group vitamins: Secondary | ICD-10-CM | POA: Diagnosis not present

## 2020-02-29 DIAGNOSIS — D649 Anemia, unspecified: Secondary | ICD-10-CM | POA: Diagnosis not present

## 2020-03-07 ENCOUNTER — Encounter: Payer: Self-pay | Admitting: Cardiology

## 2020-03-07 ENCOUNTER — Other Ambulatory Visit (HOSPITAL_COMMUNITY): Payer: Self-pay | Admitting: Cardiology

## 2020-03-07 ENCOUNTER — Ambulatory Visit (HOSPITAL_COMMUNITY)
Admission: RE | Admit: 2020-03-07 | Discharge: 2020-03-07 | Disposition: A | Discharge status: Home / Self Care | Payer: PPO | Source: Ambulatory Visit | Attending: Internal Medicine | Admitting: Internal Medicine

## 2020-03-07 ENCOUNTER — Other Ambulatory Visit: Payer: Self-pay

## 2020-03-07 ENCOUNTER — Ambulatory Visit: Payer: PPO | Admitting: Cardiology

## 2020-03-07 ENCOUNTER — Encounter: Payer: Self-pay | Admitting: *Deleted

## 2020-03-07 VITALS — BP 122/50 | HR 60 | Ht 61.0 in | Wt 140.4 lb

## 2020-03-07 DIAGNOSIS — I2510 Atherosclerotic heart disease of native coronary artery without angina pectoris: Secondary | ICD-10-CM

## 2020-03-07 DIAGNOSIS — I6523 Occlusion and stenosis of bilateral carotid arteries: Secondary | ICD-10-CM | POA: Diagnosis not present

## 2020-03-07 DIAGNOSIS — I10 Essential (primary) hypertension: Secondary | ICD-10-CM | POA: Diagnosis not present

## 2020-03-07 NOTE — Patient Instructions (Signed)

## 2020-04-02 DIAGNOSIS — Z7984 Long term (current) use of oral hypoglycemic drugs: Secondary | ICD-10-CM | POA: Diagnosis not present

## 2020-04-02 DIAGNOSIS — E663 Overweight: Secondary | ICD-10-CM | POA: Diagnosis not present

## 2020-04-02 DIAGNOSIS — E1129 Type 2 diabetes mellitus with other diabetic kidney complication: Secondary | ICD-10-CM | POA: Diagnosis not present

## 2020-04-02 DIAGNOSIS — R5081 Fever presenting with conditions classified elsewhere: Secondary | ICD-10-CM | POA: Diagnosis not present

## 2020-04-02 DIAGNOSIS — G629 Polyneuropathy, unspecified: Secondary | ICD-10-CM | POA: Diagnosis not present

## 2020-04-02 DIAGNOSIS — R809 Proteinuria, unspecified: Secondary | ICD-10-CM | POA: Diagnosis not present

## 2020-04-02 DIAGNOSIS — E1122 Type 2 diabetes mellitus with diabetic chronic kidney disease: Secondary | ICD-10-CM | POA: Diagnosis not present

## 2020-04-02 DIAGNOSIS — R6889 Other general symptoms and signs: Secondary | ICD-10-CM | POA: Diagnosis not present

## 2020-04-02 DIAGNOSIS — E211 Secondary hyperparathyroidism, not elsewhere classified: Secondary | ICD-10-CM | POA: Diagnosis not present

## 2020-04-02 DIAGNOSIS — E875 Hyperkalemia: Secondary | ICD-10-CM | POA: Diagnosis not present

## 2020-04-02 DIAGNOSIS — I6523 Occlusion and stenosis of bilateral carotid arteries: Secondary | ICD-10-CM | POA: Diagnosis not present

## 2020-04-02 DIAGNOSIS — N170 Acute kidney failure with tubular necrosis: Secondary | ICD-10-CM | POA: Diagnosis not present

## 2020-04-02 DIAGNOSIS — N189 Chronic kidney disease, unspecified: Secondary | ICD-10-CM | POA: Diagnosis not present

## 2020-04-02 DIAGNOSIS — E538 Deficiency of other specified B group vitamins: Secondary | ICD-10-CM | POA: Diagnosis not present

## 2020-04-02 DIAGNOSIS — Z6826 Body mass index (BMI) 26.0-26.9, adult: Secondary | ICD-10-CM | POA: Diagnosis not present

## 2020-04-02 DIAGNOSIS — D631 Anemia in chronic kidney disease: Secondary | ICD-10-CM | POA: Diagnosis not present

## 2020-04-04 ENCOUNTER — Other Ambulatory Visit (HOSPITAL_COMMUNITY): Payer: Self-pay | Admitting: Internal Medicine

## 2020-04-04 ENCOUNTER — Other Ambulatory Visit: Payer: Self-pay | Admitting: Internal Medicine

## 2020-04-04 DIAGNOSIS — S8992XA Unspecified injury of left lower leg, initial encounter: Secondary | ICD-10-CM

## 2020-04-04 DIAGNOSIS — S79912A Unspecified injury of left hip, initial encounter: Secondary | ICD-10-CM

## 2020-04-04 DIAGNOSIS — R4701 Aphasia: Secondary | ICD-10-CM

## 2020-04-05 DIAGNOSIS — E1129 Type 2 diabetes mellitus with other diabetic kidney complication: Secondary | ICD-10-CM | POA: Diagnosis not present

## 2020-04-05 DIAGNOSIS — E872 Acidosis: Secondary | ICD-10-CM | POA: Diagnosis not present

## 2020-04-05 DIAGNOSIS — E211 Secondary hyperparathyroidism, not elsewhere classified: Secondary | ICD-10-CM | POA: Diagnosis not present

## 2020-04-05 DIAGNOSIS — N189 Chronic kidney disease, unspecified: Secondary | ICD-10-CM | POA: Diagnosis not present

## 2020-04-05 DIAGNOSIS — D631 Anemia in chronic kidney disease: Secondary | ICD-10-CM | POA: Diagnosis not present

## 2020-04-05 DIAGNOSIS — R809 Proteinuria, unspecified: Secondary | ICD-10-CM | POA: Diagnosis not present

## 2020-04-05 DIAGNOSIS — E1122 Type 2 diabetes mellitus with diabetic chronic kidney disease: Secondary | ICD-10-CM | POA: Diagnosis not present

## 2020-04-17 ENCOUNTER — Ambulatory Visit (HOSPITAL_COMMUNITY)
Admission: RE | Admit: 2020-04-17 | Discharge: 2020-04-17 | Disposition: A | Discharge status: Home / Self Care | Payer: PPO | Source: Ambulatory Visit | Attending: Internal Medicine | Admitting: Internal Medicine

## 2020-04-17 ENCOUNTER — Other Ambulatory Visit: Payer: Self-pay

## 2020-04-17 ENCOUNTER — Other Ambulatory Visit (HOSPITAL_COMMUNITY): Payer: Self-pay | Admitting: Internal Medicine

## 2020-04-17 DIAGNOSIS — G319 Degenerative disease of nervous system, unspecified: Secondary | ICD-10-CM | POA: Diagnosis not present

## 2020-04-17 DIAGNOSIS — S8992XA Unspecified injury of left lower leg, initial encounter: Secondary | ICD-10-CM

## 2020-04-17 DIAGNOSIS — R4701 Aphasia: Secondary | ICD-10-CM | POA: Diagnosis not present

## 2020-04-17 DIAGNOSIS — H748X1 Other specified disorders of right middle ear and mastoid: Secondary | ICD-10-CM | POA: Diagnosis not present

## 2020-04-17 DIAGNOSIS — S79912A Unspecified injury of left hip, initial encounter: Secondary | ICD-10-CM | POA: Diagnosis not present

## 2020-04-17 DIAGNOSIS — M25552 Pain in left hip: Secondary | ICD-10-CM | POA: Diagnosis not present

## 2020-04-17 DIAGNOSIS — I6782 Cerebral ischemia: Secondary | ICD-10-CM | POA: Diagnosis not present

## 2020-04-17 DIAGNOSIS — I708 Atherosclerosis of other arteries: Secondary | ICD-10-CM | POA: Diagnosis not present

## 2020-04-17 IMAGING — DX DG HIP (WITH OR WITHOUT PELVIS) 2-3V*L*
3 series · 3 of 3 positions shown · non-contrast
Comparison: CT of the pelvis [DATE]

CLINICAL DATA: Left hip pain after fall three months ago.

EXAM:
DG HIP (WITH OR WITHOUT PELVIS) 2-3V LEFT

[pelvis ap]
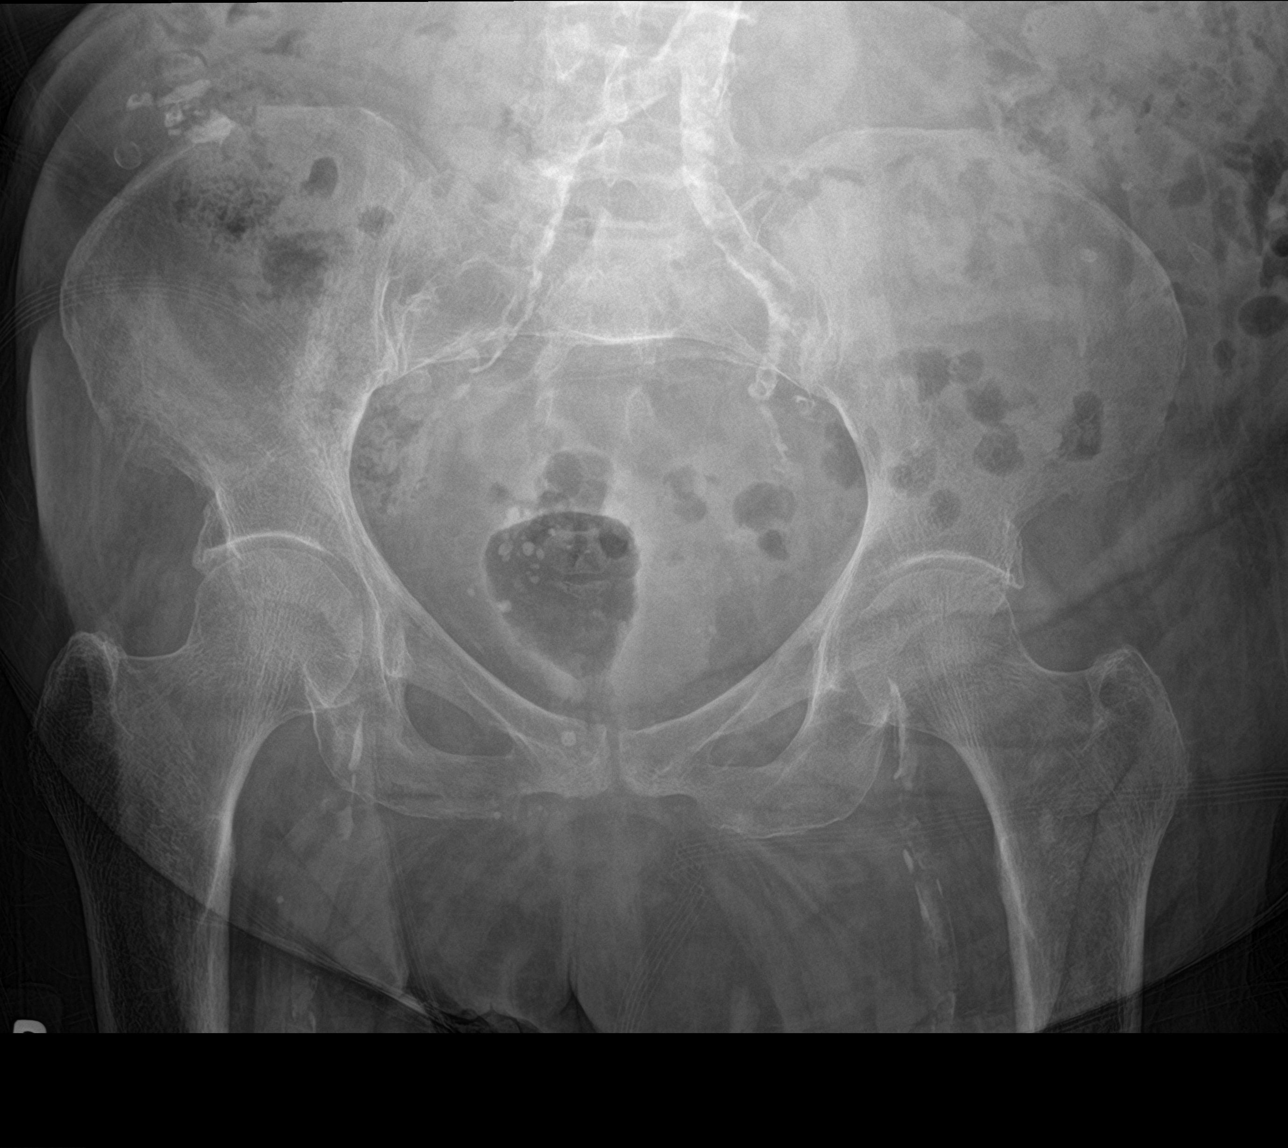

[hip ap]
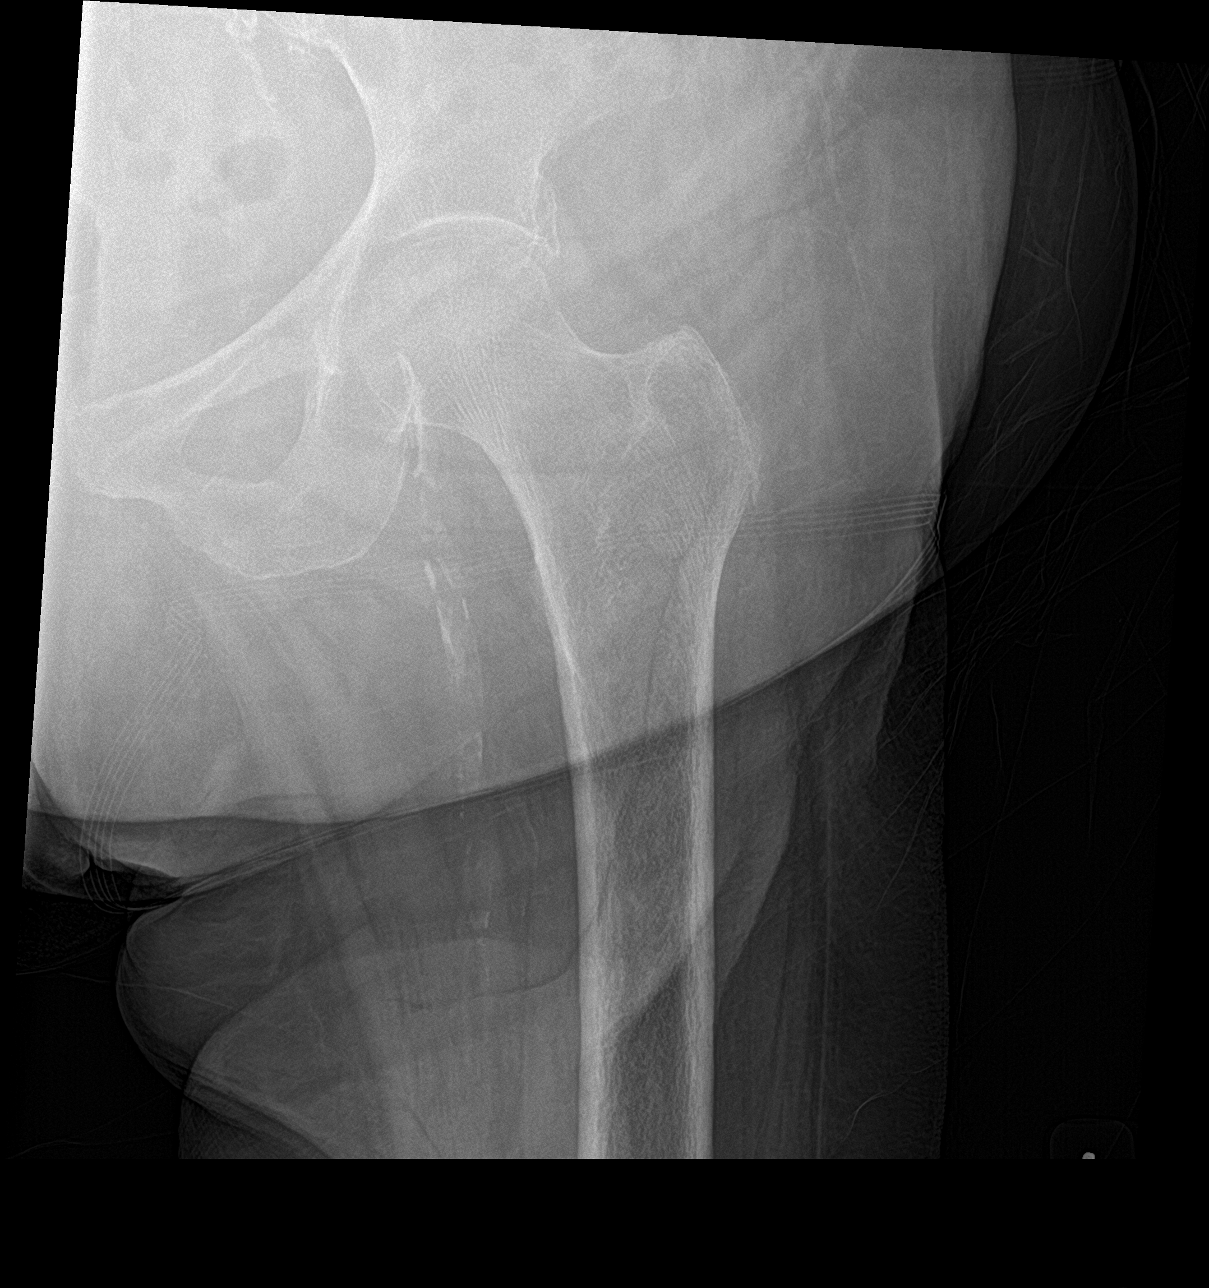

[hip frog leg]
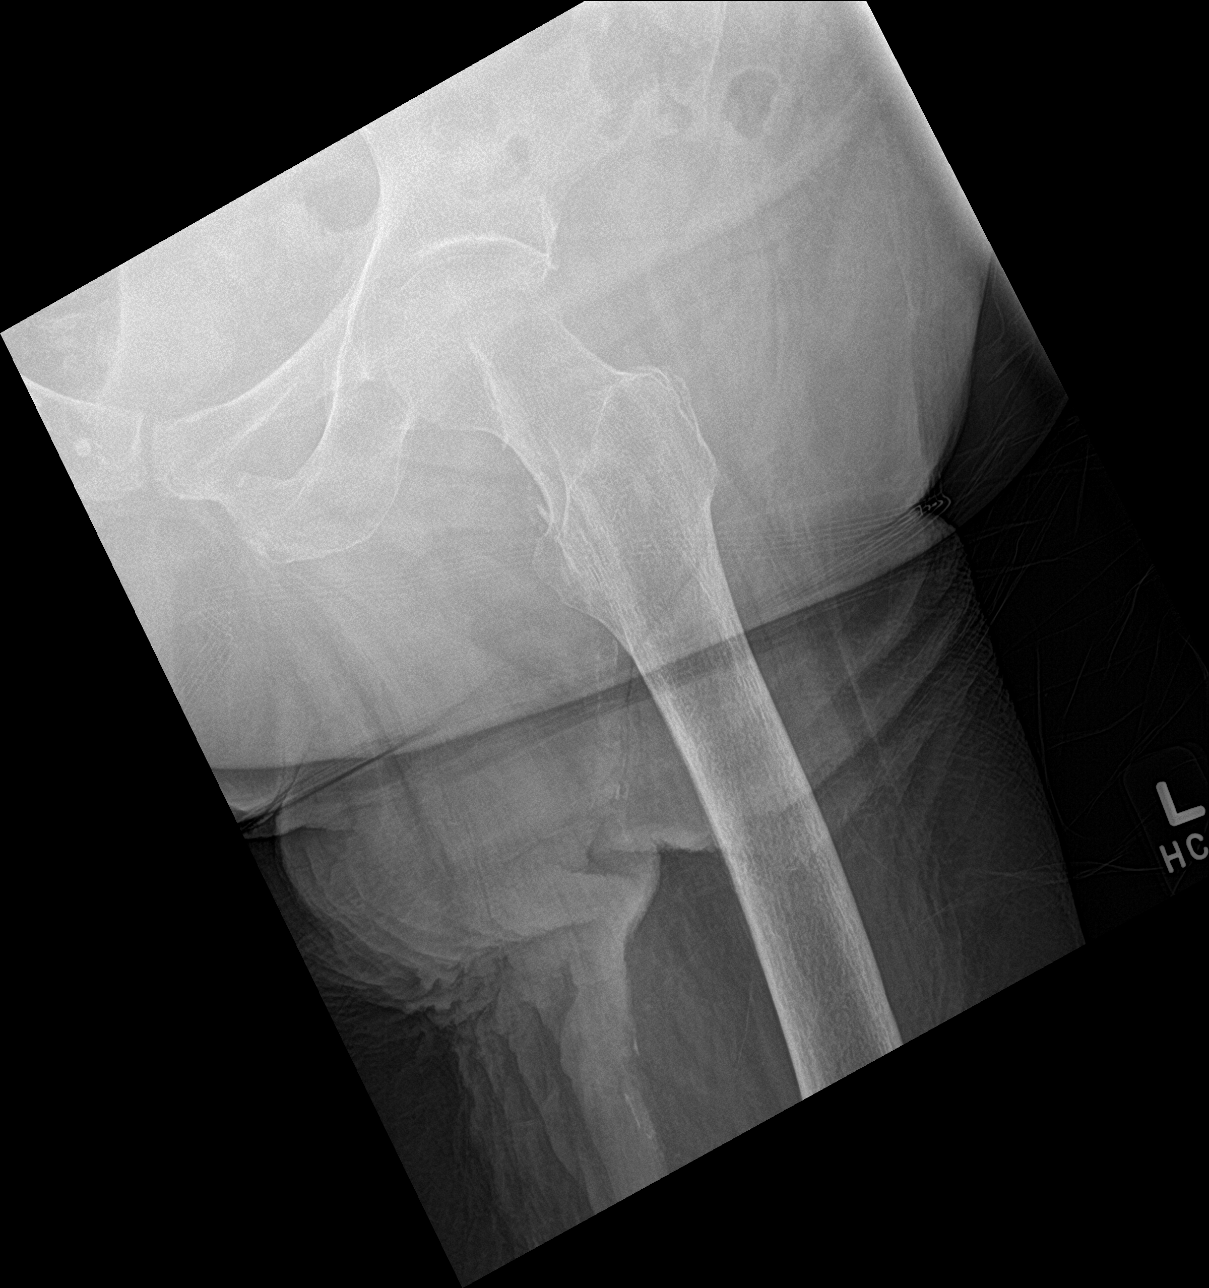

[3 of 3 positions shown; findings below may reference images not displayed]

FINDINGS: Left hip is located. No acute bone or soft tissue abnormality is
present. Vascular calcifications are noted.
IMPRESSION: 1. No acute or focal abnormality to explain left hip pain.
2. Atherosclerosis.

## 2020-04-17 IMAGING — MR MR HEAD W/O CM
11 of 12 series · 40 of 48 positions shown · non-contrast
Comparison: [DATE] report and prior.

CLINICAL DATA: Aphasia

EXAM:
MRI HEAD WITHOUT CONTRAST
TECHNIQUE: Multiplanar, multiecho pulse sequences of the brain and surrounding
structures were obtained without intravenous contrast.

[Series 5: DWI · axial · 4.0mm · 0.88mm/px · z∈[-53,+86]mm · 4 of 36 slices shown (1 of 6)]
[im 1/36]
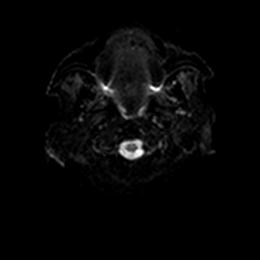
[im 12/36]
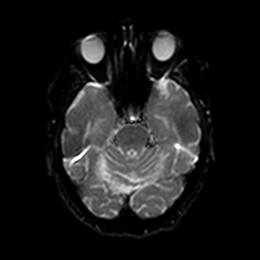
[im 24/36]
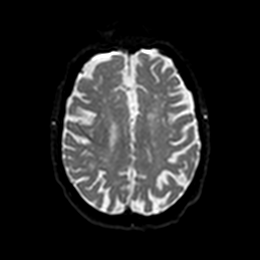
[im 36/36]
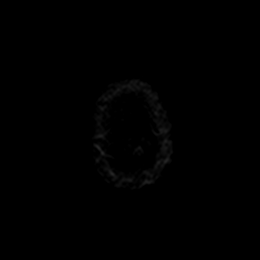

[Series 5: DWI · axial · 4.0mm · 0.88mm/px · z∈[-53,+86]mm · 4 of 36 slices shown (2 of 6)]
[im 1/36]
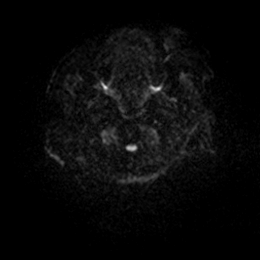
[im 12/36]
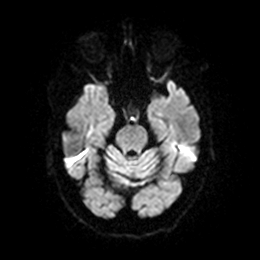
[im 24/36]
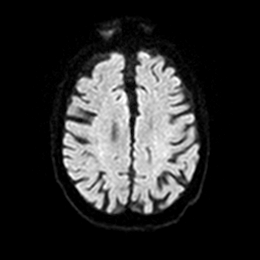
[im 36/36]
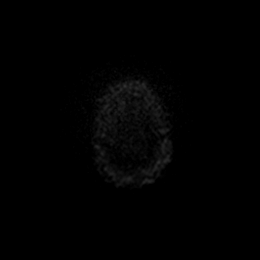

[Series 6: DWI · axial · 4.0mm · 0.88mm/px · z∈[-53,+86]mm · 5 of 36 slices shown (3 of 6)]
[im 1/36]
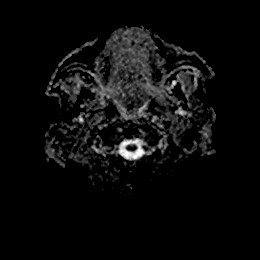
[im 9/36]
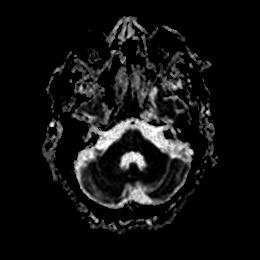
[im 18/36]
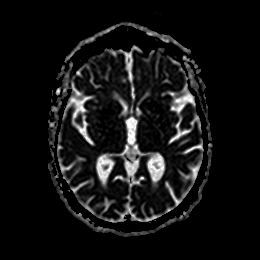
[im 27/36]
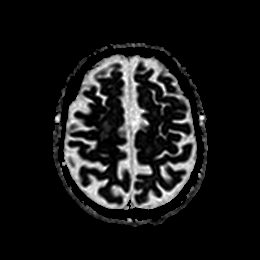
[im 36/36]
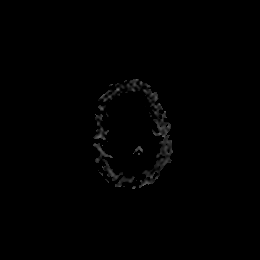

[Series 7: DWI · coronal · 5.0mm · 0.88mm/px · 4 of 28 slices shown (4 of 6)]
[im 1/28]
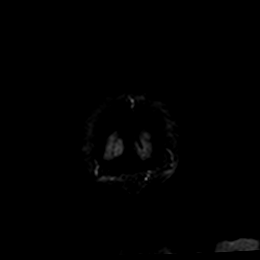
[im 10/28]
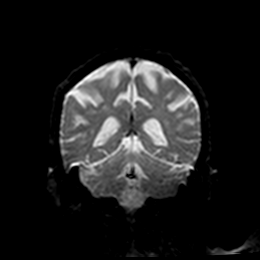
[im 19/28]
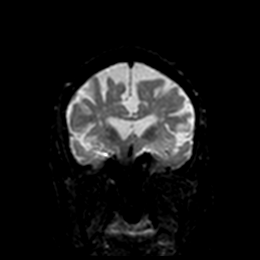
[im 28/28]
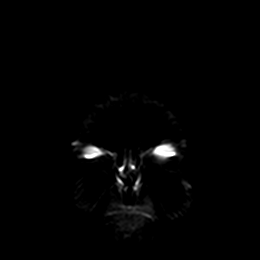

[Series 7: DWI · coronal · 5.0mm · 0.88mm/px · 4 of 28 slices shown (5 of 6)]
[im 1/28]
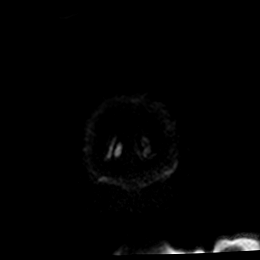
[im 10/28]
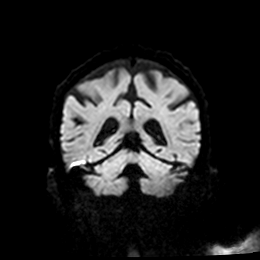
[im 19/28]
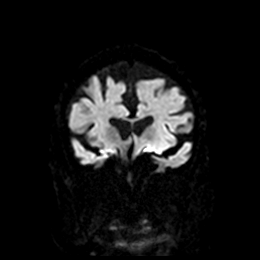
[im 28/28]
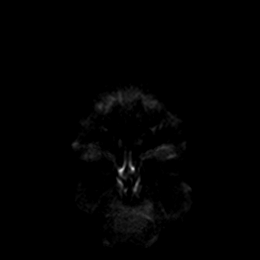

[Series 8: DWI · coronal · 5.0mm · 0.88mm/px · 4 of 28 slices shown (6 of 6)]
[im 1/28]
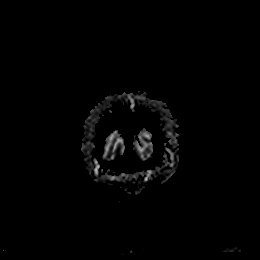
[im 10/28]
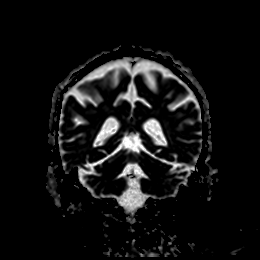
[im 19/28]
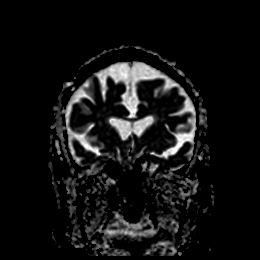
[im 28/28]
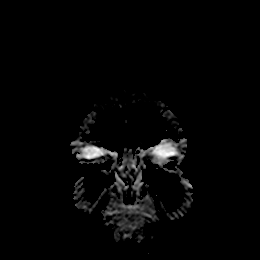

[Series 9: T1 · sagittal · 5.0mm · 0.94mm/px · 2 of 19 slices shown]
[im 1/19]
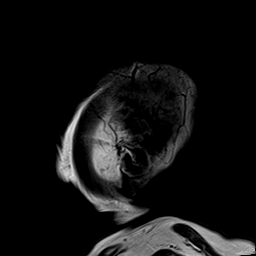
[im 19/19]
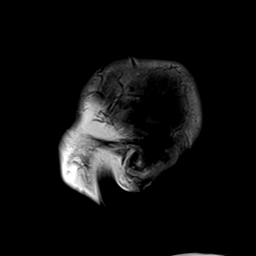

[Series 10: T2 · axial · 5.0mm · 0.72mm/px · z∈[-49,+83]mm · 3 of 20 slices shown (1 of 2)]
[im 1/20]
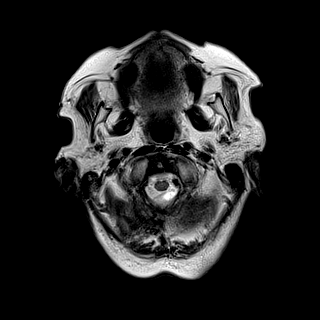
[im 10/20]
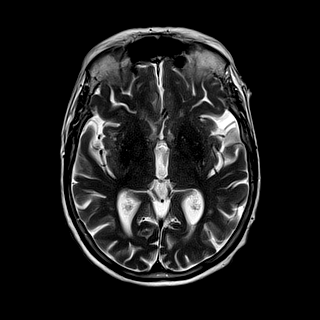
[im 20/20]
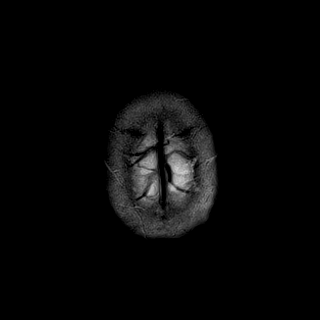

[Series 11: ax hemo · axial · 5.0mm · 0.86mm/px · z∈[-57,+86]mm · 3 of 25 slices shown]
[im 1/25]
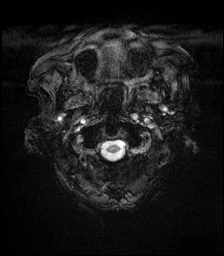
[im 13/25]
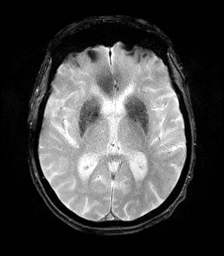
[im 25/25]
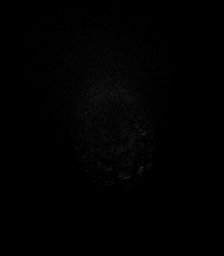

[Series 12: FLAIR · axial · 4.0mm · 0.43mm/px · z∈[-47,+76]mm · 4 of 32 slices shown]
[im 1/32]
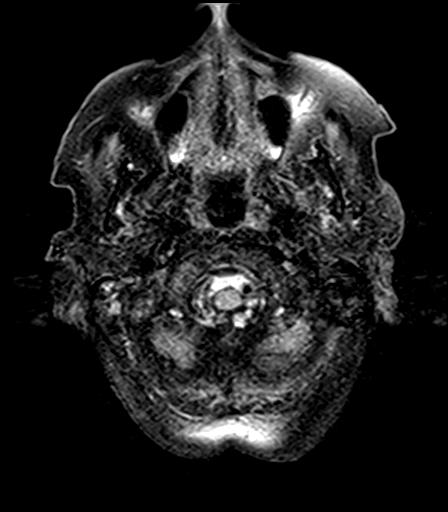
[im 11/32]
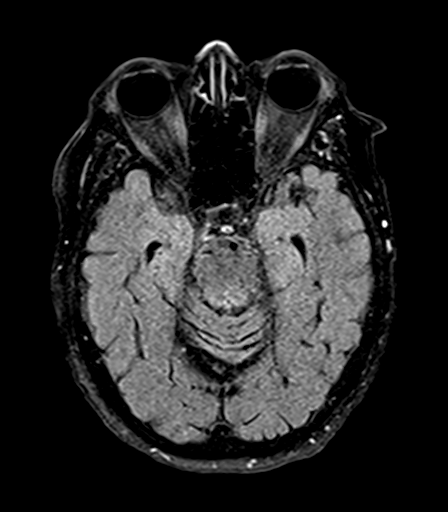
[im 21/32]
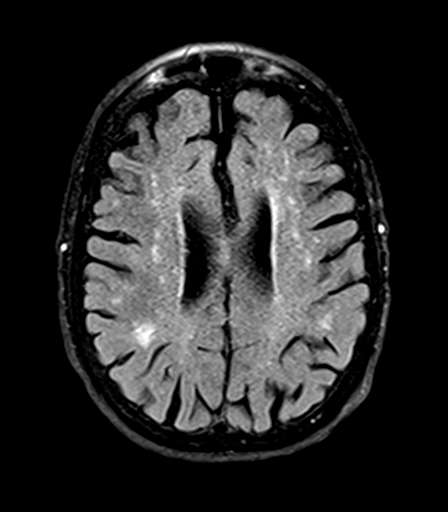
[im 32/32]
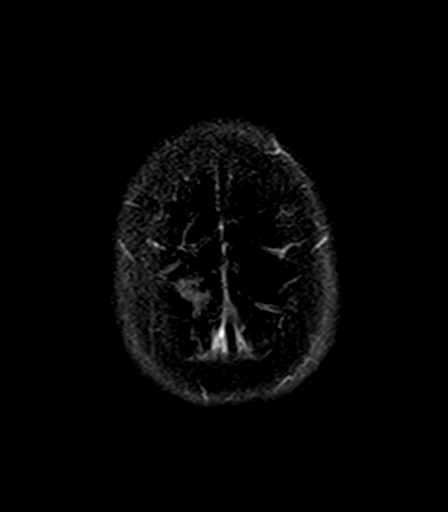

[Series 14: T2 · coronal · 5.0mm · 0.72mm/px · 3 of 24 slices shown (2 of 2)]
[im 1/24]
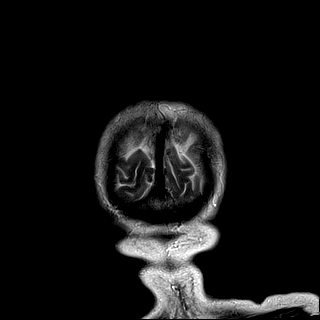
[im 12/24]
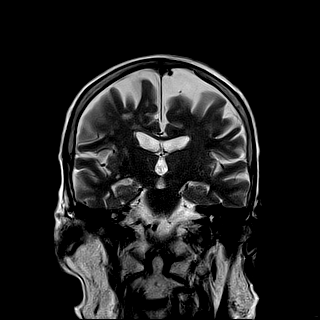
[im 24/24]
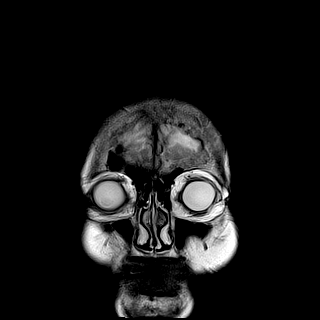

[40 of 48 positions shown; findings below may reference images not displayed]

FINDINGS: Brain: No diffusion-weighted signal abnormality. No intracranial
hemorrhage. No midline shift, ventriculomegaly or extra-axial fluid
collection. No mass lesion. Mild cerebral atrophy with ex vacuo
dilatation. Moderate chronic microvascular ischemic changes.

Vascular: Normal flow voids.

Skull and upper cervical spine: Normal marrow signal.

Sinuses/Orbits: Sequela of bilateral lens replacement. Small left
sphenoid sinus mucous retention cyst. Trace right mastoid free
fluid.

Other: None.
IMPRESSION: No acute intracranial process.

Mild cerebral atrophy and moderate chronic microvascular ischemic
changes.

## 2020-04-17 IMAGING — DX DG KNEE COMPLETE 4+V*L*
4 series · 4 of 4 positions shown · non-contrast
Comparison: None.

CLINICAL DATA: Injury of the left knee. Initial encounter.

EXAM:
LEFT KNEE - COMPLETE 4+ VIEW

[knee ap]
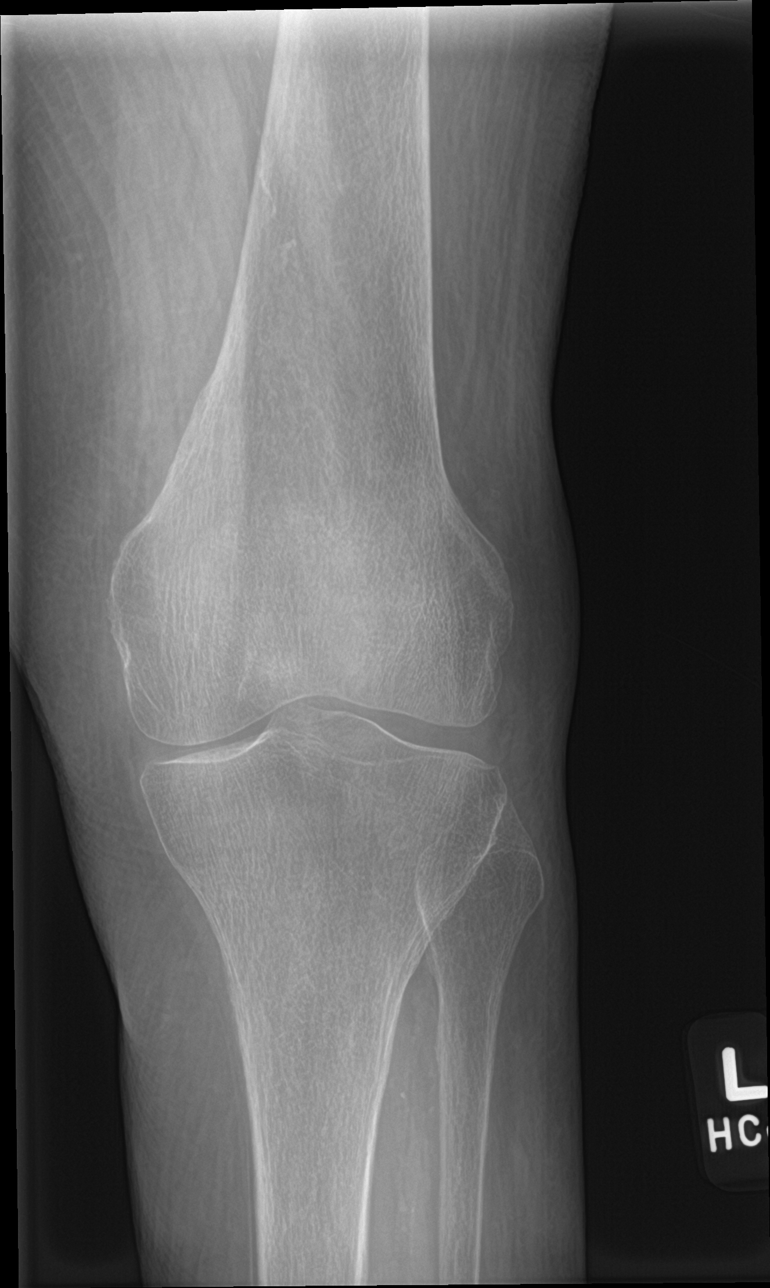

[knee obl (1 of 2)]
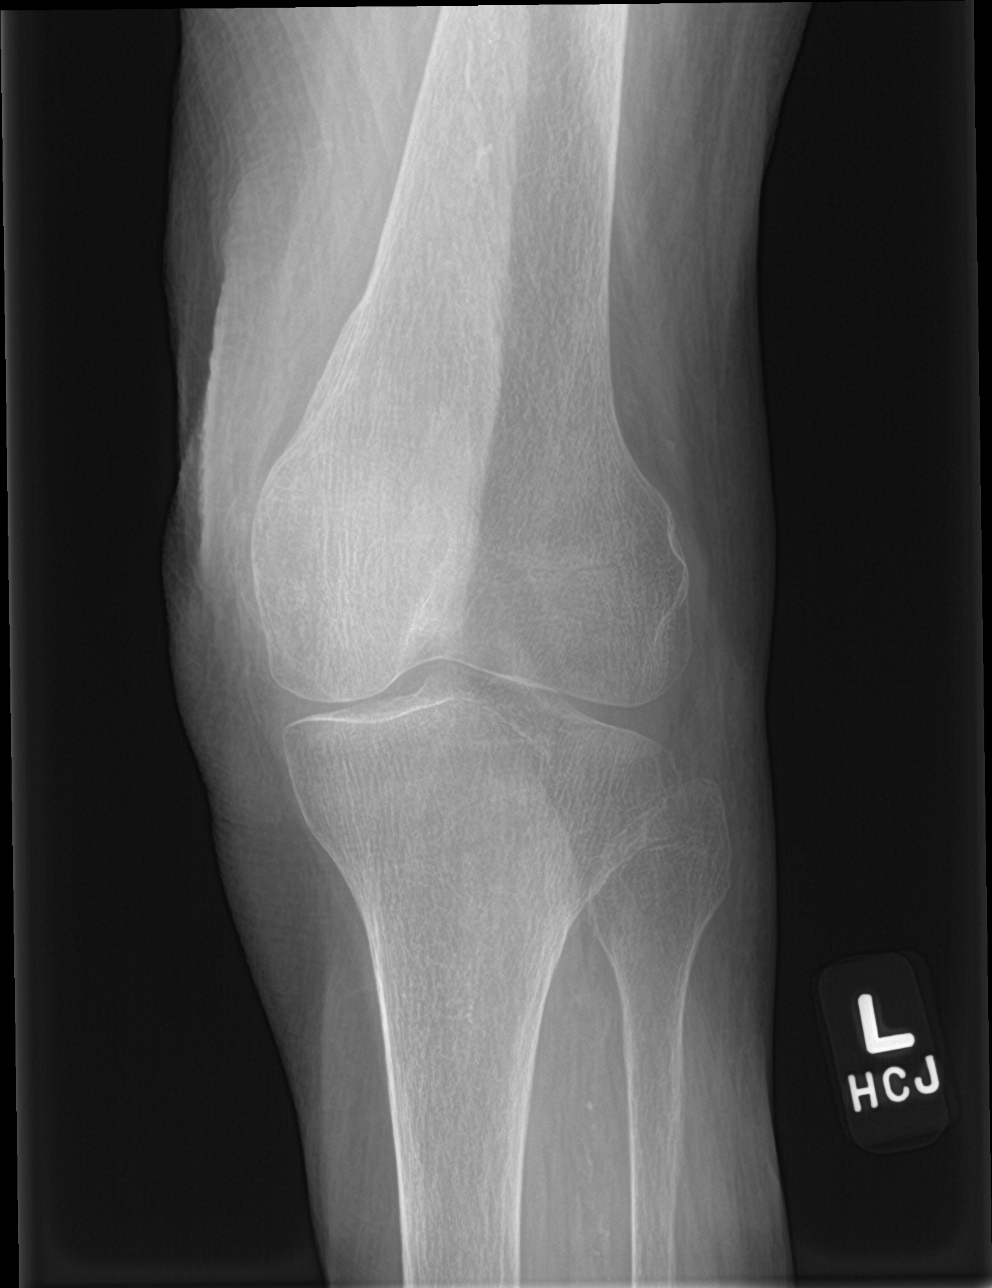

[knee obl (2 of 2)]
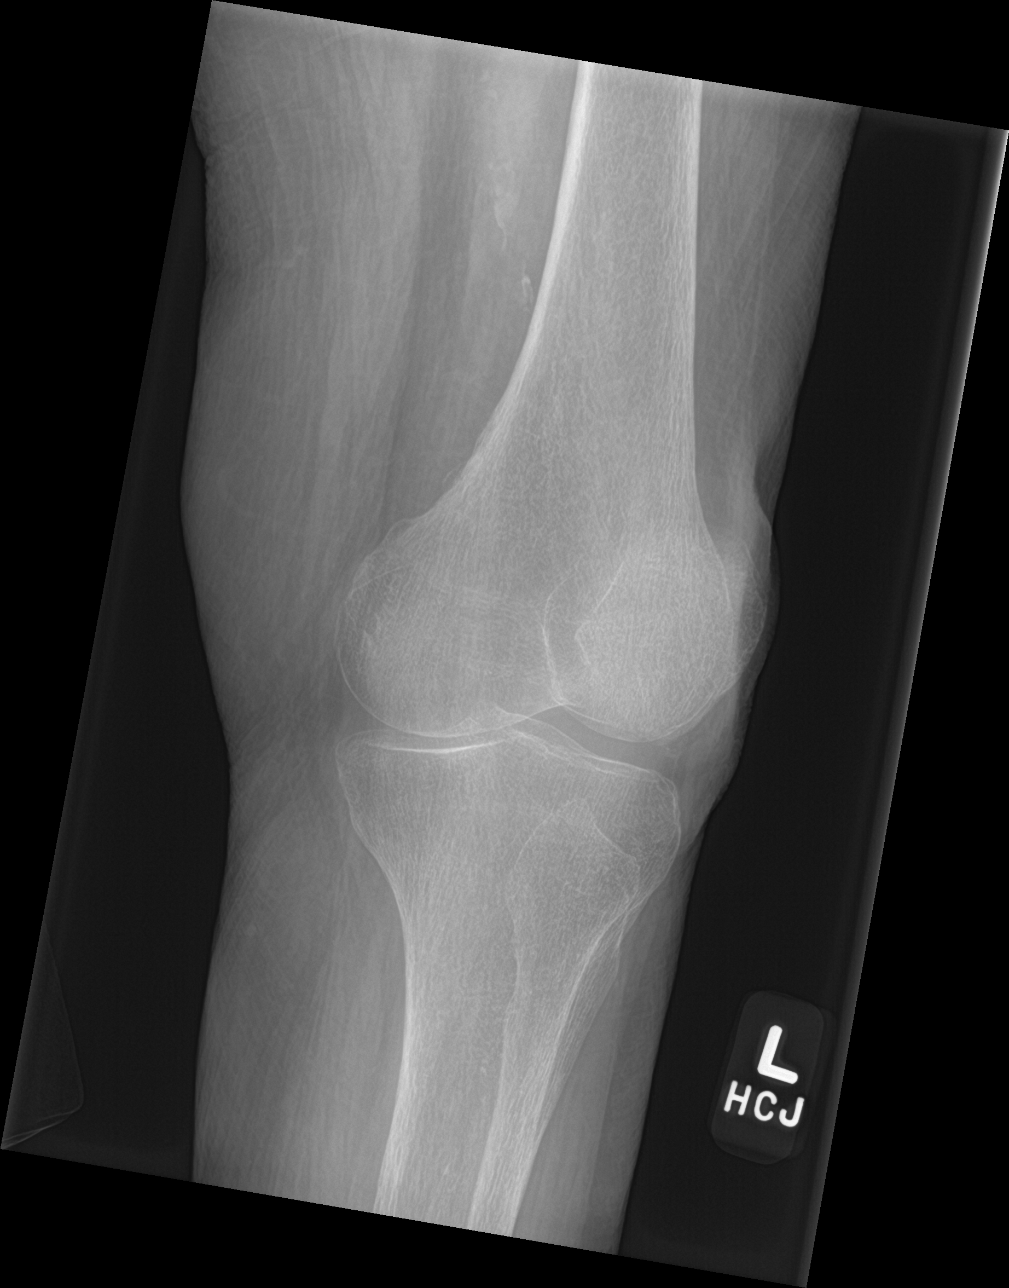

[knee lat]
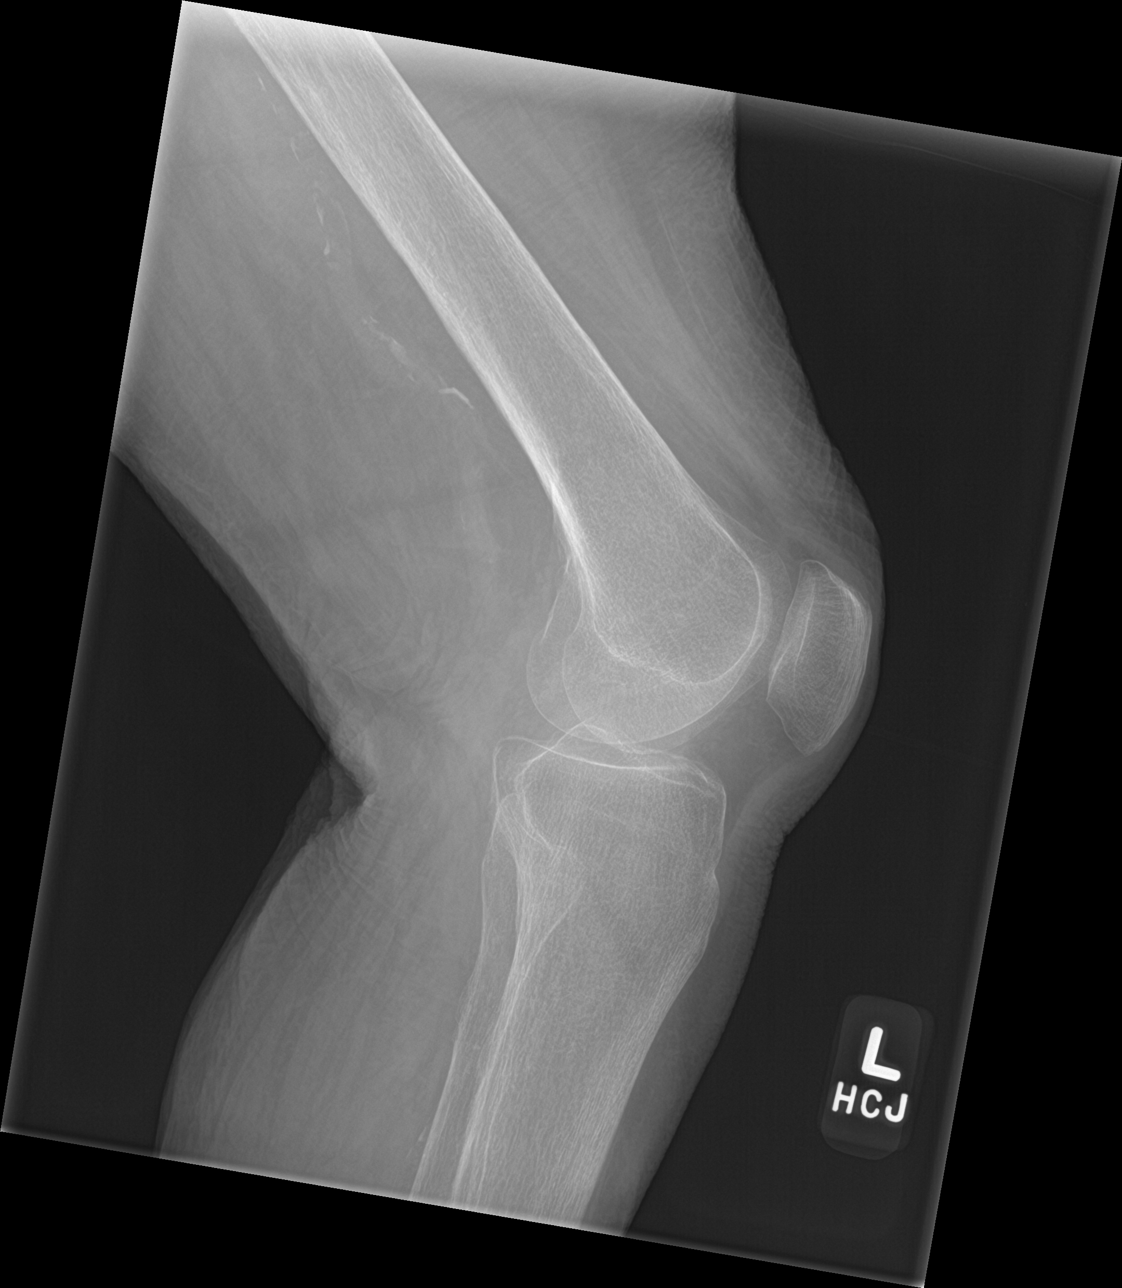

[4 of 4 positions shown; findings below may reference images not displayed]

FINDINGS: Left knee is located. No significant effusion is present. No acute
or healing fracture is present. Atherosclerotic calcifications are
present within the femoral artery.
IMPRESSION: 1. No acute or healing fracture.
2. Atherosclerosis.

## 2020-05-01 DIAGNOSIS — E119 Type 2 diabetes mellitus without complications: Secondary | ICD-10-CM | POA: Diagnosis not present

## 2020-05-09 DIAGNOSIS — R809 Proteinuria, unspecified: Secondary | ICD-10-CM | POA: Diagnosis not present

## 2020-05-09 DIAGNOSIS — N184 Chronic kidney disease, stage 4 (severe): Secondary | ICD-10-CM | POA: Diagnosis not present

## 2020-05-09 DIAGNOSIS — Z6826 Body mass index (BMI) 26.0-26.9, adult: Secondary | ICD-10-CM | POA: Diagnosis not present

## 2020-05-09 DIAGNOSIS — E1165 Type 2 diabetes mellitus with hyperglycemia: Secondary | ICD-10-CM | POA: Diagnosis not present

## 2020-05-09 DIAGNOSIS — E1129 Type 2 diabetes mellitus with other diabetic kidney complication: Secondary | ICD-10-CM | POA: Diagnosis not present

## 2020-05-09 DIAGNOSIS — N189 Chronic kidney disease, unspecified: Secondary | ICD-10-CM | POA: Diagnosis not present

## 2020-05-09 DIAGNOSIS — D631 Anemia in chronic kidney disease: Secondary | ICD-10-CM | POA: Diagnosis not present

## 2020-05-09 DIAGNOSIS — E1122 Type 2 diabetes mellitus with diabetic chronic kidney disease: Secondary | ICD-10-CM | POA: Diagnosis not present

## 2020-05-09 DIAGNOSIS — E211 Secondary hyperparathyroidism, not elsewhere classified: Secondary | ICD-10-CM | POA: Diagnosis not present

## 2020-05-09 DIAGNOSIS — E872 Acidosis: Secondary | ICD-10-CM | POA: Diagnosis not present

## 2020-05-09 DIAGNOSIS — I10 Essential (primary) hypertension: Secondary | ICD-10-CM | POA: Diagnosis not present

## 2020-05-09 DIAGNOSIS — E538 Deficiency of other specified B group vitamins: Secondary | ICD-10-CM | POA: Diagnosis not present

## 2020-05-09 DIAGNOSIS — D510 Vitamin B12 deficiency anemia due to intrinsic factor deficiency: Secondary | ICD-10-CM | POA: Diagnosis not present

## 2020-06-21 DIAGNOSIS — E1122 Type 2 diabetes mellitus with diabetic chronic kidney disease: Secondary | ICD-10-CM | POA: Diagnosis not present

## 2020-06-21 DIAGNOSIS — E872 Acidosis: Secondary | ICD-10-CM | POA: Diagnosis not present

## 2020-06-21 DIAGNOSIS — E211 Secondary hyperparathyroidism, not elsewhere classified: Secondary | ICD-10-CM | POA: Diagnosis not present

## 2020-06-21 DIAGNOSIS — D631 Anemia in chronic kidney disease: Secondary | ICD-10-CM | POA: Diagnosis not present

## 2020-06-21 DIAGNOSIS — N189 Chronic kidney disease, unspecified: Secondary | ICD-10-CM | POA: Diagnosis not present

## 2020-06-21 DIAGNOSIS — E1129 Type 2 diabetes mellitus with other diabetic kidney complication: Secondary | ICD-10-CM | POA: Diagnosis not present

## 2020-06-21 DIAGNOSIS — R809 Proteinuria, unspecified: Secondary | ICD-10-CM | POA: Diagnosis not present

## 2020-07-05 ENCOUNTER — Encounter: Payer: Self-pay | Admitting: *Deleted

## 2020-07-09 ENCOUNTER — Ambulatory Visit: Payer: PPO | Admitting: Neurology

## 2020-07-09 ENCOUNTER — Encounter: Payer: Self-pay | Admitting: Neurology

## 2020-07-09 VITALS — BP 168/60 | HR 72

## 2020-07-09 DIAGNOSIS — E1142 Type 2 diabetes mellitus with diabetic polyneuropathy: Secondary | ICD-10-CM

## 2020-07-09 DIAGNOSIS — M5442 Lumbago with sciatica, left side: Secondary | ICD-10-CM

## 2020-07-09 DIAGNOSIS — R269 Unspecified abnormalities of gait and mobility: Secondary | ICD-10-CM | POA: Diagnosis not present

## 2020-07-09 DIAGNOSIS — G8929 Other chronic pain: Secondary | ICD-10-CM
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
## 2020-07-10 ENCOUNTER — Telehealth: Payer: Self-pay | Admitting: Neurology

## 2020-07-10 NOTE — Telephone Encounter (Signed)
Health team no auth. Patient is scheduled at [facility] for Monday 07/23/20 to arrive at 12:30 pm. Patient is aware of time & day.

## 2020-07-23 ENCOUNTER — Ambulatory Visit (HOSPITAL_COMMUNITY)
Admission: RE | Admit: 2020-07-23 | Discharge: 2020-07-23 | Disposition: A | Discharge status: Home / Self Care | Payer: PPO | Source: Ambulatory Visit | Attending: Neurology | Admitting: Neurology

## 2020-07-23 ENCOUNTER — Telehealth: Payer: Self-pay | Admitting: Neurology

## 2020-07-23 DIAGNOSIS — R269 Unspecified abnormalities of gait and mobility: Secondary | ICD-10-CM | POA: Diagnosis not present

## 2020-07-23 DIAGNOSIS — G8929 Other chronic pain: Secondary | ICD-10-CM | POA: Insufficient documentation

## 2020-07-23 DIAGNOSIS — M5442 Lumbago with sciatica, left side: Secondary | ICD-10-CM | POA: Diagnosis not present

## 2020-07-23 DIAGNOSIS — E538 Deficiency of other specified B group vitamins: Secondary | ICD-10-CM | POA: Diagnosis not present

## 2020-07-23 DIAGNOSIS — E1142 Type 2 diabetes mellitus with diabetic polyneuropathy: Secondary | ICD-10-CM

## 2020-07-23 DIAGNOSIS — M545 Low back pain, unspecified: Secondary | ICD-10-CM | POA: Diagnosis not present

## 2020-07-23 DIAGNOSIS — M25562 Pain in left knee: Secondary | ICD-10-CM | POA: Diagnosis not present

## 2020-07-23 IMAGING — MR MR LUMBAR SPINE W/O CM
5 series · 31 of 48 positions shown · non-contrast
Comparison: [DATE].

CLINICAL DATA: Low back pain, > 6 wks

EXAM:
MRI LUMBAR SPINE WITHOUT CONTRAST
TECHNIQUE: Multiplanar, multisequence MR imaging of the lumbar spine was
performed. No intravenous contrast was administered.

[Series 9: T2 · sagittal · 4.0mm · 0.68mm/px · 7 of 15 slices shown (1 of 2)]
[im 1/15]
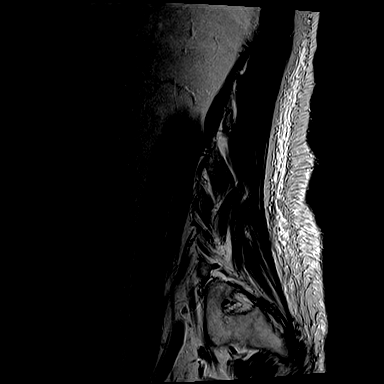
[im 3/15]
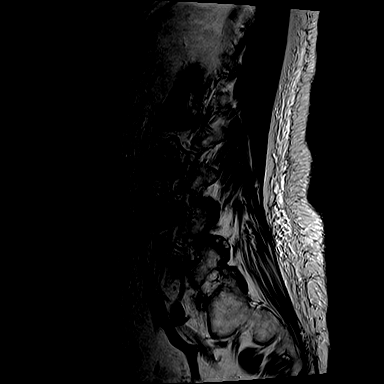
[im 5/15]
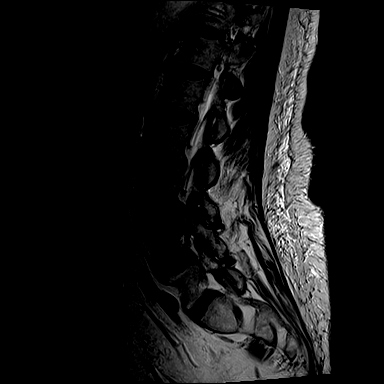
[im 8/15]
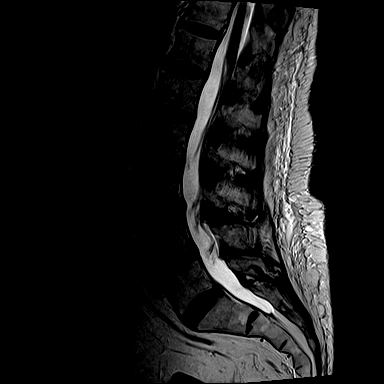
[im 10/15]
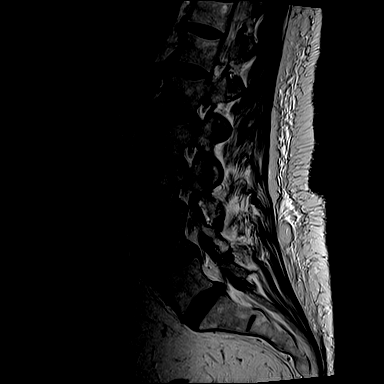
[im 12/15]
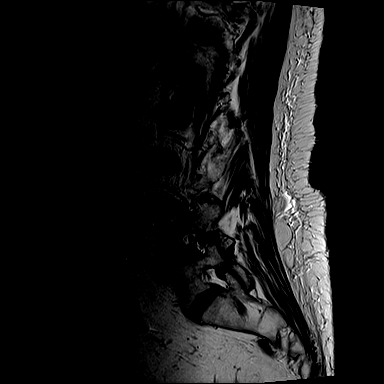
[im 15/15]
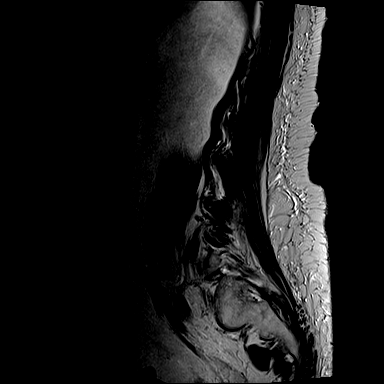

[Series 10: T1 · sagittal · 4.0mm · 0.81mm/px · 7 of 15 slices shown (1 of 2)]
[im 1/15]
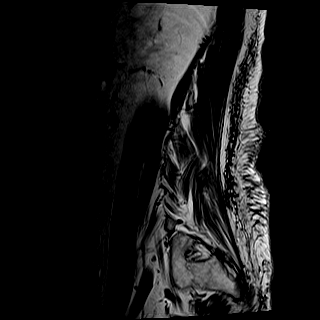
[im 3/15]
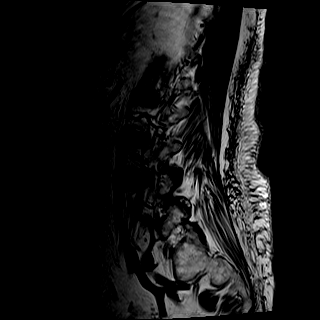
[im 5/15]
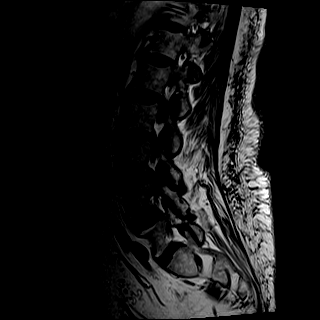
[im 8/15]
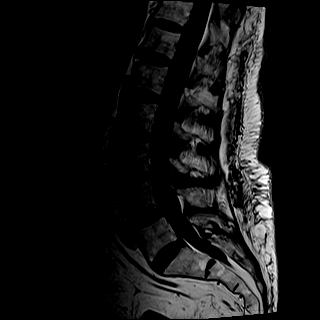
[im 10/15]
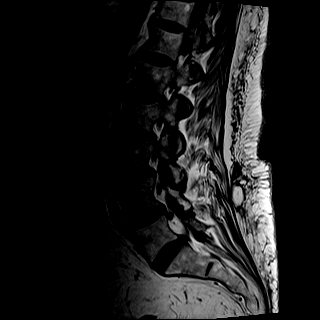
[im 12/15]
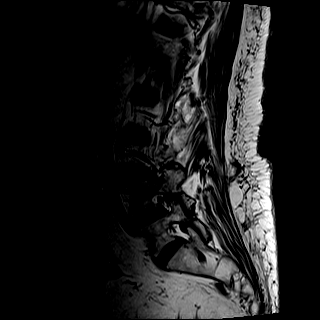
[im 15/15]
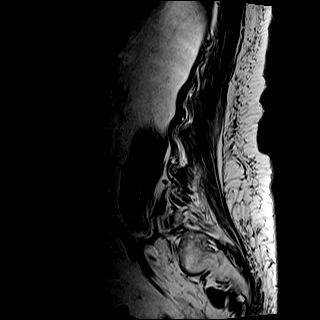

[Series 11: STIR · sagittal · 4.0mm · 0.51mm/px · 1 of 15 slices shown]
[im 1/15]
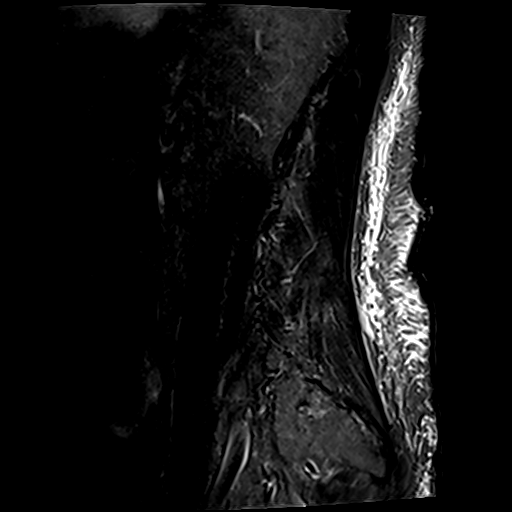

[Series 12: T2 · axial · 4.0mm · 0.70mm/px · z∈[-25,+183]mm · 8 of 34 slices shown (2 of 2)]
[im 1/34]
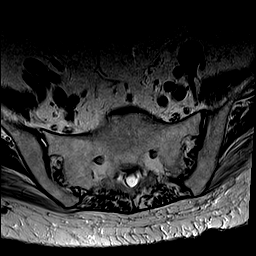
[im 6/34]
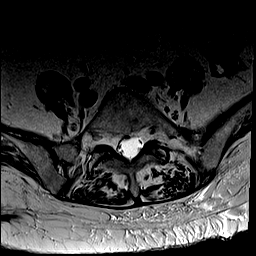
[im 11/34]
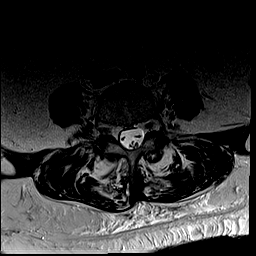
[im 16/34]
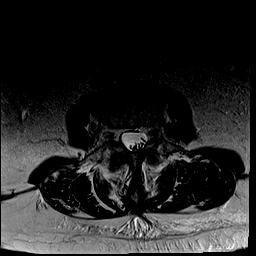
[im 18/34]
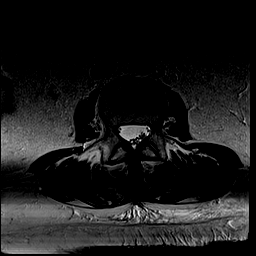
[im 23/34]
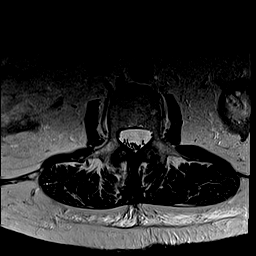
[im 28/34]
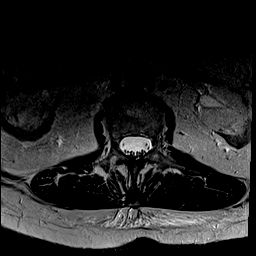
[im 34/34]
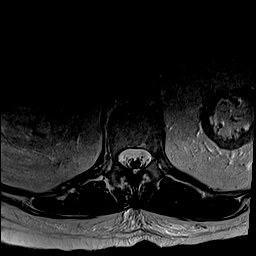

[Series 13: T1 · axial · 4.0mm · 0.35mm/px · z∈[-25,+183]mm · 8 of 34 slices shown (2 of 2)]
[im 1/34]
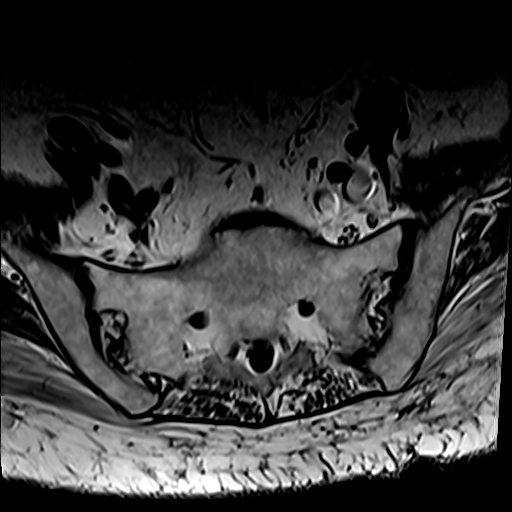
[im 6/34]
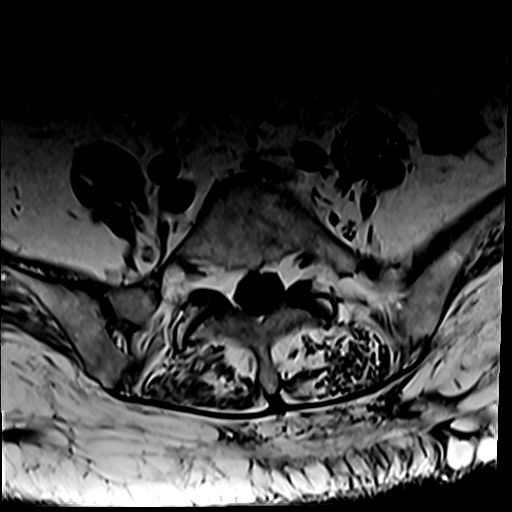
[im 11/34]
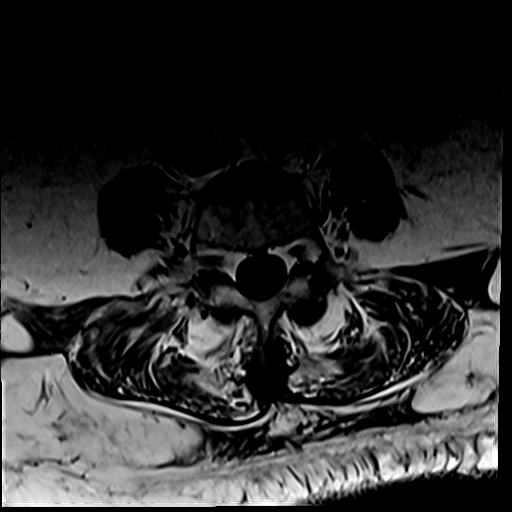
[im 16/34]
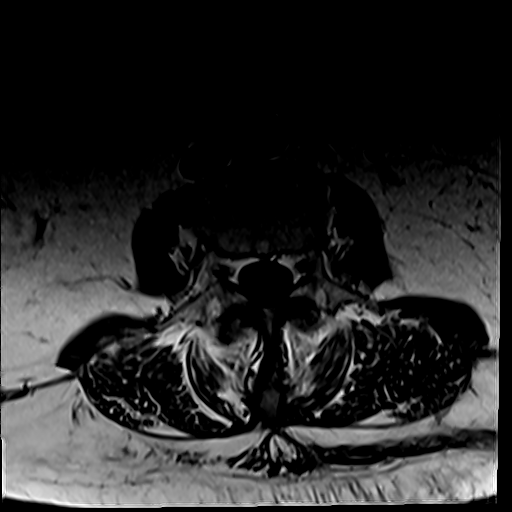
[im 18/34]
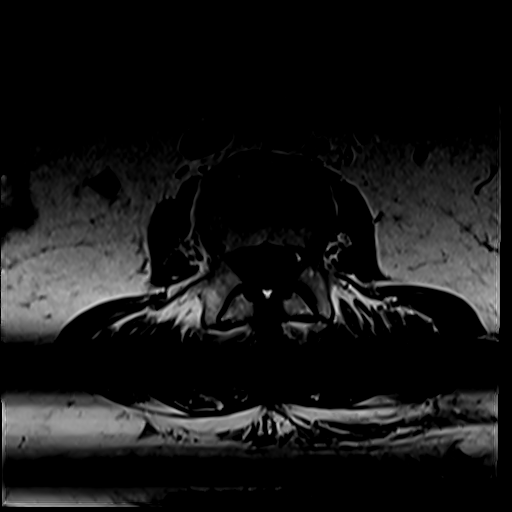
[im 23/34]
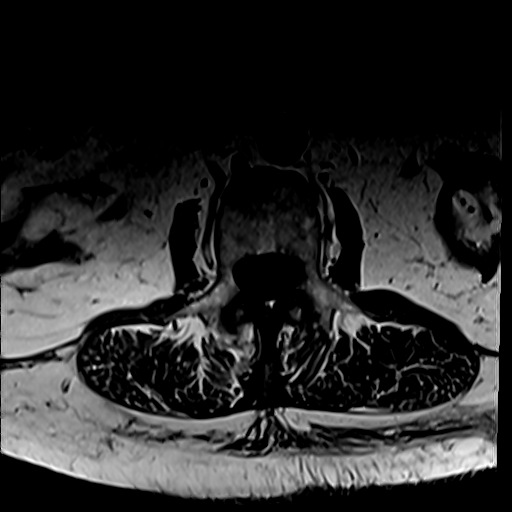
[im 28/34]
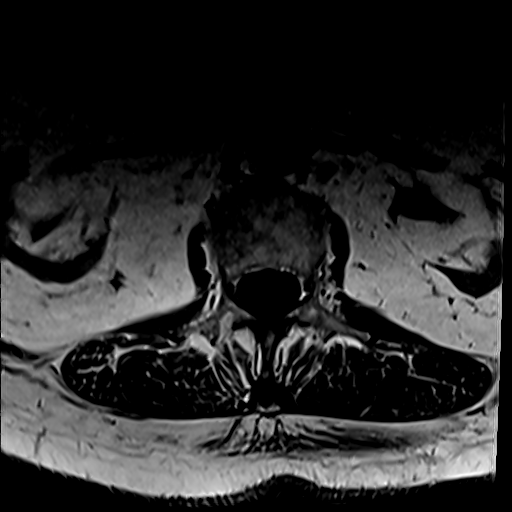
[im 34/34]
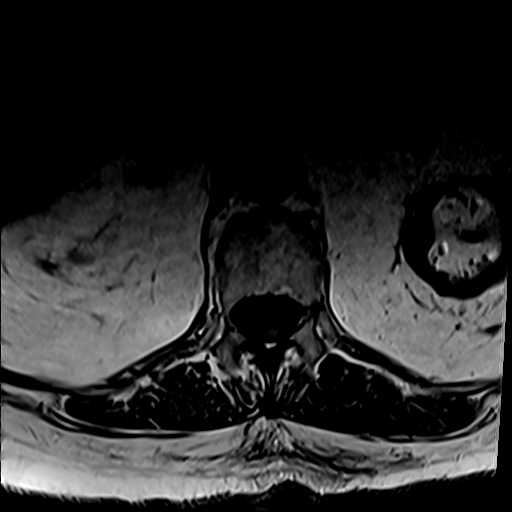

[31 of 48 positions shown; findings below may reference images not displayed]

FINDINGS: Segmentation:  Standard.

Alignment:  Normal.

Vertebrae: Minimal Modic type 2 endplate degenerative changes.
Scattered hemangiomata.

Conus medullaris and cauda equina: Conus extends to the L1 level.
Conus and cauda equina appear normal.

Disc levels: Multilevel desiccation.

L1-2: Minimal disc bulge. Patent spinal canal. Mild bilateral neural
foraminal narrowing.

L2-3: Minimal disc bulge and facet degenerative spurring. Patent
spinal canal. Mild bilateral neural foraminal narrowing.

L3-4: Disc bulge, ligamentum flavum thickening and bilateral facet
hypertrophy. Mild spinal canal and moderate bilateral neural
foraminal narrowing.

L4-5: Disc bulge, prominent ligamentum flavum and bilateral facet
hypertrophy. Shallow right foraminal protrusion. Patent spinal
canal. Mild bilateral neural foraminal narrowing.

L5-S1: Disc bulge with superimposed right foraminal protrusion
abutting the right S1 nerve root. Facet degenerative spurring.
Patent spinal and left neural foramen. Mild right neural foraminal
narrowing.

Paraspinal and other soft tissues: Negative.
IMPRESSION: Mild spinal canal and moderate bilateral neural foraminal narrowing
at the L3-4 level.

Mild bilateral L1-3, L4-5 and right L5-S1 neural foraminal
narrowing.

## 2020-07-23 IMAGING — MR MR KNEE*L* W/O CM
7 series · 40 of 40 positions shown · non-contrast
Comparison: X-ray [DATE]

CLINICAL DATA: Left knee pain for 3 weeks

EXAM:
MRI OF THE LEFT KNEE WITHOUT CONTRAST
TECHNIQUE: Multiplanar, multisequence MR imaging of the knee was performed. No
intravenous contrast was administered.

[Series 8: T2 fat-sat · axial · left · 4.0mm · 0.47mm/px · z∈[-89,+35]mm · 5 of 26 slices shown (1 of 3)]
[im 1/26]
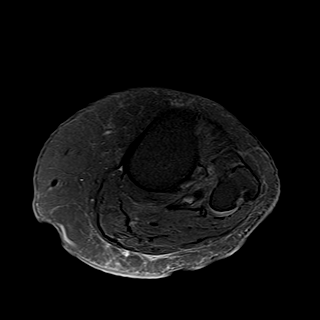
[im 7/26]
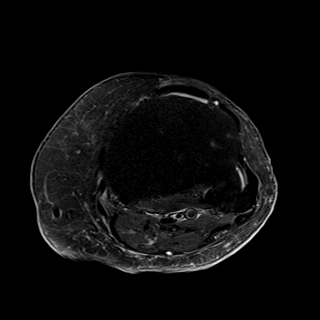
[im 13/26]
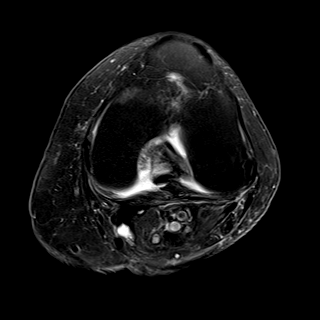
[im 19/26]
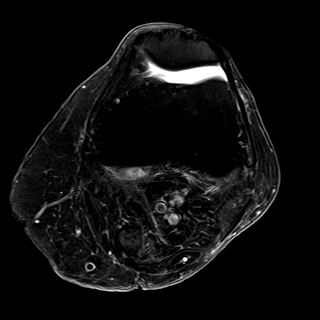
[im 26/26]
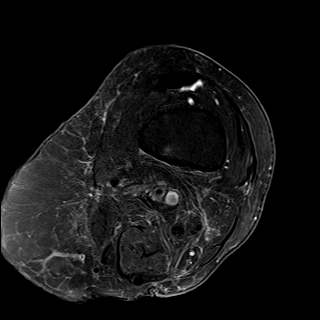

[Series 9: T1 · coronal · left · 4.0mm · 0.59mm/px · 6 of 24 slices shown]
[im 1/24]
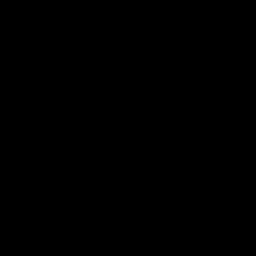
[im 5/24]
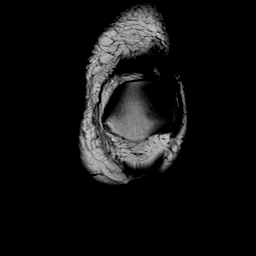
[im 10/24]
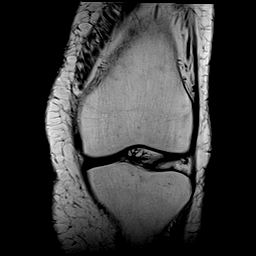
[im 14/24]
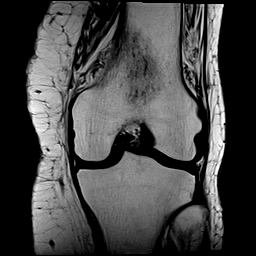
[im 19/24]
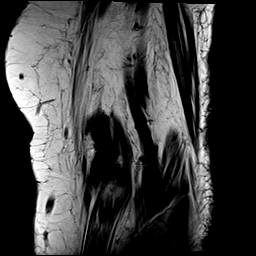
[im 24/24]
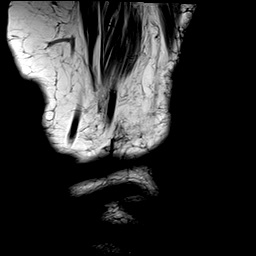

[Series 10: T2 fat-sat · coronal · left · 4.0mm · 0.59mm/px · 7 of 28 slices shown (2 of 3)]
[im 1/28]
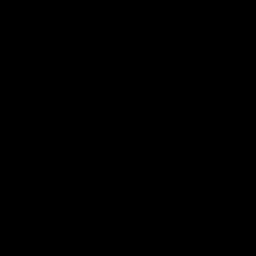
[im 5/28]
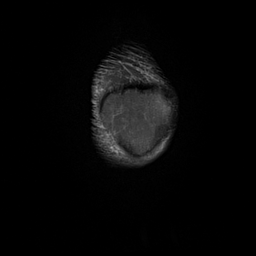
[im 10/28]
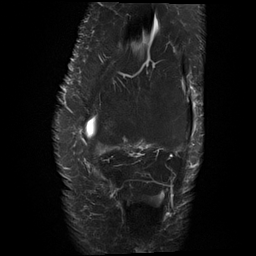
[im 14/28]
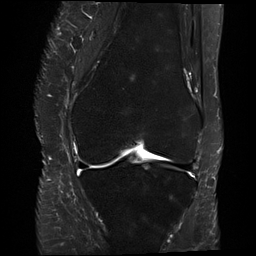
[im 19/28]
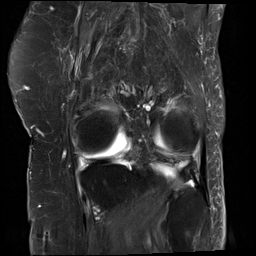
[im 23/28]
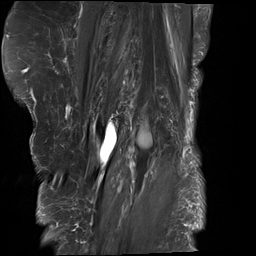
[im 28/28]
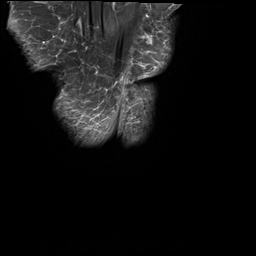

[Series 11: PD fat-sat · coronal · left · 4.0mm · 0.59mm/px · 6 of 27 slices shown (1 of 2)]
[im 1/27]
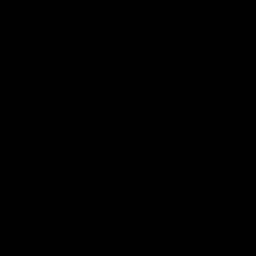
[im 6/27]
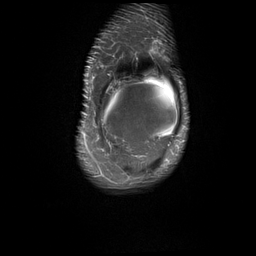
[im 11/27]
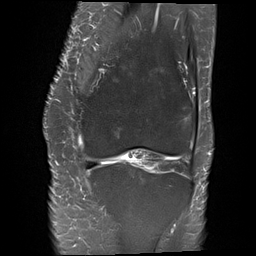
[im 16/27]
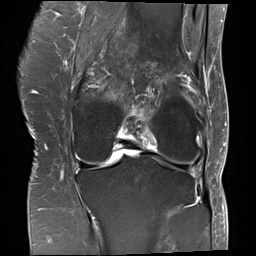
[im 21/27]
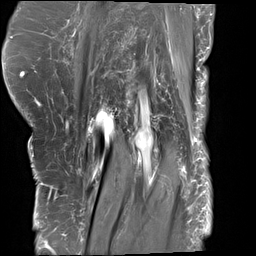
[im 27/27]
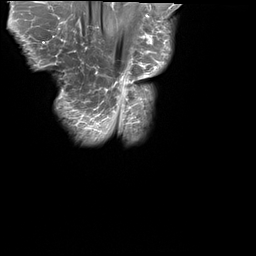

[Series 12: PD fat-sat · sagittal · left · 3.0mm · 0.59mm/px · 7 of 28 slices shown (2 of 2)]
[im 1/28]
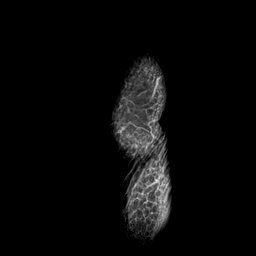
[im 5/28]
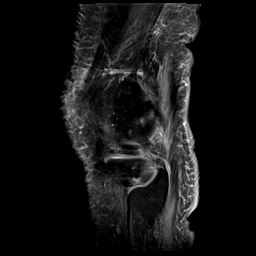
[im 10/28]
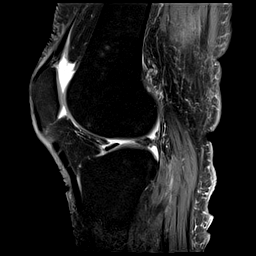
[im 14/28]
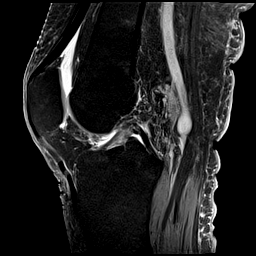
[im 19/28]
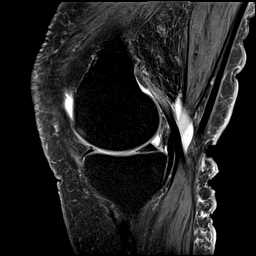
[im 23/28]
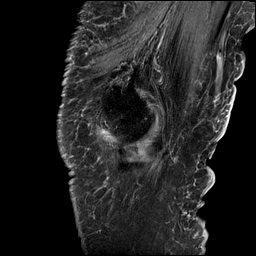
[im 28/28]
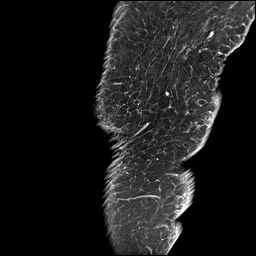

[Series 13: T2 fat-sat · sagittal · left · 3.0mm · 0.59mm/px · 7 of 28 slices shown (3 of 3)]
[im 1/28]
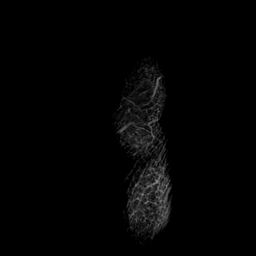
[im 5/28]
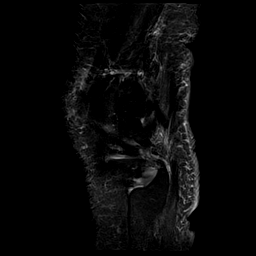
[im 10/28]
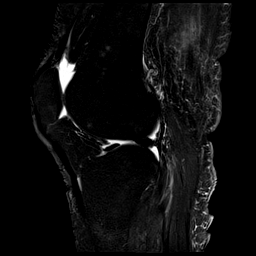
[im 14/28]
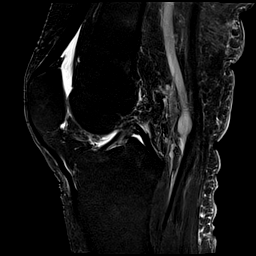
[im 19/28]
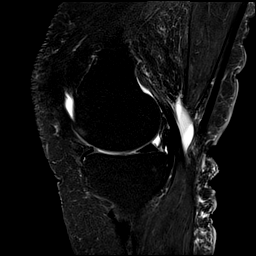
[im 23/28]
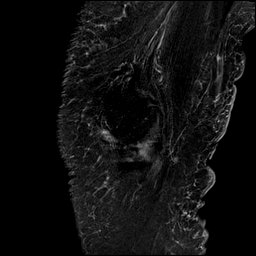
[im 28/28]
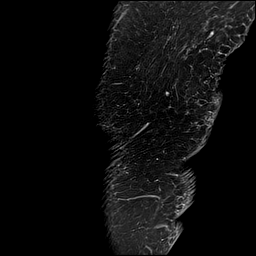

[Series 14: PD · coronal · left · 2.0mm · 0.47mm/px · 2 of 10 slices shown]
[im 1/10]
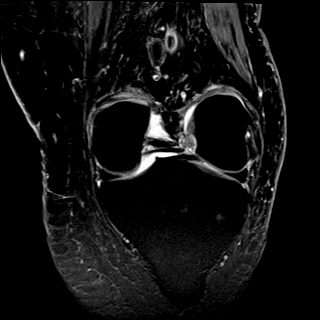
[im 10/10]
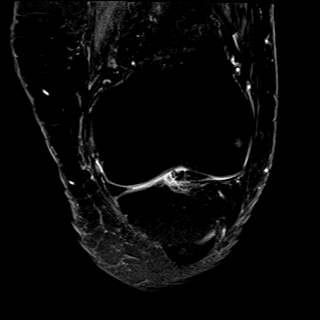

[40 of 40 positions shown; findings below may reference images not displayed]

FINDINGS: MENISCI

Medial meniscus: Intrasubstance degeneration with subtle
degenerative tear involving the free edge and undersurface of the
posterior horn (series 12, images 9-10).

Lateral meniscus: Intrasubstance degeneration without discrete tear.

LIGAMENTS

Cruciates:  Intact ACL and PCL.

Collaterals: Medial collateral ligament is intact. Lateral
collateral ligament complex is intact.

CARTILAGE

Patellofemoral: 4 mm full-thickness cartilage defect involving the
inferior aspect of the medial trochlea (series 13, image 14). No
patellar cartilage defect.

Medial: Mild chondral thinning of the weight-bearing medial
compartment.

Lateral:  No chondral defect.

Joint:  Small joint effusion.  Mild edema within Hoffa's fat.

Popliteal Fossa:  Small Baker's cyst.  Intact popliteus tendon.

Extensor Mechanism:  Intact quadriceps tendon and patellar tendon.

Bones: No focal marrow signal abnormality. No fracture or
dislocation.

Other: None.
IMPRESSION: 1. Intrasubstance degeneration with subtle degenerative tear
involving the free edge and undersurface of the posterior horn of
the medial meniscus.
2. Mild osteoarthritis with small focal cartilage defect involving
the medial trochlea.
3. Small joint effusion and small Baker's cyst.

## 2020-07-23 NOTE — Telephone Encounter (Signed)
  IMPRESSION: Mild spinal canal and moderate bilateral neural foraminal narrowing at the L3-4 level.  Mild bilateral L1-3, L4-5 and right L5-S1 neural foraminal narrowing.   Please call patient, MRI of lumbar spine showed mild degenerative changes, there is no evidence of spinal cord or nerve root compression

## 2020-07-23 NOTE — Telephone Encounter (Signed)
I called the patient with her results. She verbalized understanding.

## 2020-07-24 ENCOUNTER — Telehealth: Payer: Self-pay | Admitting: Neurology

## 2020-07-24 NOTE — Telephone Encounter (Signed)
   IMPRESSION: 1. Intrasubstance degeneration with subtle degenerative tear involving the free edge and undersurface of the posterior horn of the medial meniscus. 2. Mild osteoarthritis with small focal cartilage defect involving the medial trochlea. 3. Small joint effusion and small Baker's cyst.  Please call patient, MRI of left knee showed degenerative changes, she would benefit orthopedic evaluation, I have forwarded the MRI report to her primary care physician Redmond School, MD She may contact him for referral

## 2020-07-24 NOTE — Telephone Encounter (Signed)
I called the patient and provided her with the MRI knee results. She will follow up with her PCP to discuss an orthopaedic referral. She will keep her pending NCV/EMG appt here on 08/13/20.

## 2020-08-13 ENCOUNTER — Ambulatory Visit: Payer: PPO | Admitting: Neurology

## 2020-08-13 ENCOUNTER — Ambulatory Visit (INDEPENDENT_AMBULATORY_CARE_PROVIDER_SITE_OTHER): Payer: PPO | Admitting: Neurology

## 2020-08-13 ENCOUNTER — Telehealth: Payer: Self-pay | Admitting: Neurology

## 2020-08-13 DIAGNOSIS — G8929 Other chronic pain: Secondary | ICD-10-CM | POA: Diagnosis not present

## 2020-08-13 DIAGNOSIS — M5442 Lumbago with sciatica, left side: Secondary | ICD-10-CM | POA: Diagnosis not present

## 2020-08-13 DIAGNOSIS — E1142 Type 2 diabetes mellitus with diabetic polyneuropathy: Secondary | ICD-10-CM

## 2020-08-13 DIAGNOSIS — R531 Weakness: Secondary | ICD-10-CM | POA: Diagnosis not present

## 2020-08-13 DIAGNOSIS — Z0289 Encounter for other administrative examinations: Secondary | ICD-10-CM

## 2020-08-13 DIAGNOSIS — R269 Unspecified abnormalities of gait and mobility: Secondary | ICD-10-CM

## 2020-08-13 NOTE — Progress Notes (Signed)
EMG report is under procedure tab. 

## 2020-08-13 NOTE — Telephone Encounter (Signed)
Open in error
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
## 2020-08-13 NOTE — Procedures (Signed)
Full Name: [name] Gender: Female MRN #: [id number] Date of Birth: 1941-02-25    Visit Date: 08/13/2020 09:13 Age: 80 Years Examining Physician: Marcial Pacas, MD  Referring Physician: Marcial Pacas, MD History: 80 years old female, with long history of diabetes, knee injury, complains of knee pain, gait abnormality, generalized weakness  Summary of the test:  Nerve conduction study:  Bilateral superficial peroneal, sural, ulnar sensory responses were absent. Bilateral tibial, peroneal to EDB, and the left ulnar motor responses also showed significantly decreased CMAP amplitude, with normal distal latency, mildly decreased on low normal range conduction velocity.  Bilateral tibial, left ulnar F-wave latency was normal  Electromyography:  Selected needle examinations were performed at bilateral lower extremity muscles, bilateral lumbosacral paraspinal muscles, left upper extremity muscles and the left cervical paraspinal muscles  There was no significant abnormality found, in specific, there was no evidence of inflammatory myopathy, or significant chronic neuropathic changes.  Conclusion: This is an abnormal study.  There is electrodiagnostic evidence of severe length dependent sensorimotor polyneuropathy.  There is no evidence of chronic bilateral lumbosacral radiculopathy, or inflammatory myopathy.   ------------------------------- Marcial Pacas, M.D. PhD.  [facility] [address], [place], [place] [address] Tel: [phone] Fax: [phone]  Verbal informed consent was obtained from the patient, patient was informed of potential risk of procedure, including bruising, bleeding, hematoma formation, infection, muscle weakness, muscle pain, numbness, among others.        [place]    Nerve / Sites Muscle Latency Ref. Amplitude Ref. Rel Amp Segments Distance Velocity Ref. Area    ms ms mV mV %  cm m/s m/s mVms  L Ulnar - ADM     Wrist ADM 3.0  ?3.3 2.0 ?6.0 100 Wrist - ADM 7   3.4     B.Elbow ADM 6.5  3.0  152 B.Elbow - Wrist 19 55 ?49 8.8     A.Elbow ADM 8.9  2.1  71.3 A.Elbow - B.Elbow 10 41 ?49 6.7  R Peroneal - EDB     Ankle EDB 3.8 ?6.5 1.1 ?2.0 100 Ankle - EDB 9   3.3     Fib head EDB 10.5  0.9  82.5 Fib head - Ankle 25 37 ?44 2.9     Pop fossa EDB 13.9  0.7  82.4 Pop fossa - Fib head 10 30 ?44 2.1         Pop fossa - Ankle      L Peroneal - EDB     Ankle EDB 4.0 ?6.5 0.8 ?2.0 100 Ankle - EDB 9   2.0     Fib head EDB 10.4  0.5  69.5 Fib head - Ankle 24 37 ?44 1.5     Pop fossa EDB 13.3  0.5  89.9 Pop fossa - Fib head 10 36 ?44 1.5         Pop fossa - Ankle      R Tibial - AH     Ankle AH 4.1 ?5.8 2.2 ?4.0 100 Ankle - AH 9   6.8     Pop fossa AH 14.1  1.6  72.9 Pop fossa - Ankle 34 34 ?41 5.6  L Tibial - AH     Ankle AH 4.7 ?5.8 0.9 ?4.0 100 Ankle - AH 9   2.8     Pop fossa AH 14.9  1.0  104 Pop fossa - Ankle 32 31 ?41 1.8  [place]    Nerve / Sites Rec. Site Peak Lat Ref.  Amp Ref. Segments Distance    ms ms V V  cm  R Sural - Ankle (Calf)     Calf Ankle NR ?4.4 NR ?6 Calf - Ankle 14  L Sural - Ankle (Calf)     Calf Ankle NR ?4.4 NR ?6 Calf - Ankle 14  R Superficial peroneal - Ankle     Lat leg Ankle NR ?4.4 NR ?6 Lat leg - Ankle 14  L Superficial peroneal - Ankle     Lat leg Ankle NR ?4.4 NR ?6 Lat leg - Ankle 14  L Ulnar - Orthodromic, (Dig V, Mid palm)     Dig V Wrist NR ?3.1 NR ?5 Dig V - Wrist 39               F  Wave    Nerve F Lat Ref.   ms ms  R Tibial - AH 58.5 ?56.0  L Tibial - AH 56.8 ?56.0  L Ulnar - ADM 30.9 ?32.0           EMG Summary Table    Spontaneous MUAP Recruitment  Muscle IA Fib PSW Fasc Other Amp Dur. Poly Pattern  R. Tibialis anterior Normal None None None _______ Normal Normal Normal Normal  R. Tibialis posterior Normal None None None _______ Normal Normal Normal Normal  R. Peroneus longus Normal None None None _______ Normal Normal Normal Normal  R. Vastus lateralis  Normal None None None _______ Normal Normal Normal Normal  R. Iliopsoas Normal None None None _______ Normal Normal Normal Normal  L. Tibialis anterior Normal None None None _______ Normal Normal Normal Normal  L. Tibialis posterior Normal None None None _______ Normal Normal Normal Normal  L. Peroneus longus Normal None None None _______ Normal Normal Normal Normal  L. Gastrocnemius (Medial head) Normal None None None _______ Normal Normal Normal Normal  L. Vastus lateralis Normal None None None _______ Normal Normal Normal Normal  L. Iliopsoas Normal None None None _______ Normal Normal Normal Normal  L. Gluteus medius Normal None None None _______ Normal Normal Normal Normal  R. Lumbar paraspinals (mid) Normal None None None _______ Normal Normal Normal Normal  R. Lumbar paraspinals (low) Normal None None None _______ Normal Normal Normal Normal  L. Lumbar paraspinals (low) Normal None None None _______ Normal Normal Normal Normal  L. Lumbar paraspinals (mid) Normal None None None _______ Normal Normal Normal Normal  L. First dorsal interosseous Normal None None None _______ Normal Normal Normal Normal  L. Pronator teres Normal None None None _______ Normal Normal Normal Normal  L. Biceps brachii Normal None None None _______ Normal Normal Normal Normal  L. Deltoid Normal None None None _______ Normal Normal Normal Normal  L. Triceps brachii Normal None None None _______ Normal Normal Normal Normal  L. Cervical paraspinals Normal None None None _______ Normal Normal Normal Normal

## 2020-08-14 ENCOUNTER — Telehealth: Payer: Self-pay | Admitting: Neurology

## 2020-08-14 LAB — HGB A1C W/O EAG: Hgb A1c MFr Bld: 6.1 % — ABNORMAL HIGH (ref 4.8–5.6)

## 2020-08-14 LAB — VITAMIN B12: Vitamin B-12: 922 pg/mL (ref 232–1245)

## 2020-08-14 LAB — THYROID PANEL WITH TSH
Free Thyroxine Index: 1.7 (ref 1.2–4.9)
T3 Uptake Ratio: 21 % — ABNORMAL LOW (ref 24–39)
T4, Total: 8.1 ug/dL (ref 4.5–12.0)
TSH: 3.96 u[IU]/mL (ref 0.450–4.500)

## 2020-08-14 LAB — FERRITIN: Ferritin: 26 ng/mL (ref 15–150)

## 2020-08-14 LAB — ACETYLCHOLINE RECEPTOR, BINDING: AChR Binding Ab, Serum: <0.03 nmol/L (ref 0.00–0.24)

## 2020-08-14 LAB — VITAMIN D 25 HYDROXY (VIT D DEFICIENCY, FRACTURES): Vit D, 25-Hydroxy: 34.9 ng/mL (ref 30.0–100.0)

## 2020-08-14 LAB — CK: Total CK: 56 U/L (ref 26–161)

## 2020-08-14 NOTE — Telephone Encounter (Signed)
Please call patient, laboratory evaluation showed slight elevated A1c 6.1,  Rest of the laboratory evaluation showed no significant abnormalities,   I have forward the lab result to her primary care Redmond School, MD

## 2020-08-15 ENCOUNTER — Encounter: Payer: PPO | Admitting: Neurology

## 2020-08-15 NOTE — Telephone Encounter (Addendum)
Left patient a detailed message, with results, on voicemail (ok per DPR). Also, instructed her to follow up with PCP.  Provided our number to call back with any questions.

## 2020-09-03 ENCOUNTER — Ambulatory Visit (HOSPITAL_COMMUNITY): Payer: PPO | Admitting: Physical Therapy

## 2020-09-11 ENCOUNTER — Encounter (HOSPITAL_COMMUNITY): Payer: Self-pay | Admitting: Physical Therapy

## 2020-09-11 ENCOUNTER — Ambulatory Visit (HOSPITAL_COMMUNITY): Payer: PPO | Attending: Neurology | Admitting: Physical Therapy

## 2020-09-11 ENCOUNTER — Other Ambulatory Visit: Payer: Self-pay

## 2020-09-11 DIAGNOSIS — M25562 Pain in left knee: Secondary | ICD-10-CM | POA: Diagnosis not present

## 2020-09-11 DIAGNOSIS — R2689 Other abnormalities of gait and mobility: Secondary | ICD-10-CM

## 2020-09-11 DIAGNOSIS — R29898 Other symptoms and signs involving the musculoskeletal system: Secondary | ICD-10-CM | POA: Diagnosis not present

## 2020-09-11 DIAGNOSIS — D510 Vitamin B12 deficiency anemia due to intrinsic factor deficiency: Secondary | ICD-10-CM | POA: Diagnosis not present

## 2020-09-11 DIAGNOSIS — M6281 Muscle weakness (generalized): Secondary | ICD-10-CM | POA: Diagnosis not present

## 2020-09-11 NOTE — Therapy (Signed)
[place], [place], [address] Phone: [phone]   Fax:  [phone]  Physical Therapy Evaluation  Patient Details  Name: [name] MRN: [id number] Date of Birth: Jul 06, 1940 Referring Provider (PT): Marcial Pacas MD   Encounter Date: 09/11/2020   PT End of Session - 09/11/20 1614    Visit Number 1    Number of Visits 12    Date for PT Re-Evaluation 10/23/20    Authorization Type [employer] (based on medical necessity, follow MCR guidelines)    Progress Note Due on Visit 10    PT Start Time 1538    PT Stop Time 1611    PT Time Calculation (min) 33 min    Activity Tolerance Patient tolerated treatment well    Behavior During Therapy [facility] for tasks assessed/performed           Past Medical History:  Diagnosis Date  . Anxiety disorder   . Aphasia   . Carotid stenosis   . Cat allergies   . Cerebrovascular disease    CVA-left sided weakness  . Chronic kidney disease (CKD), stage III (moderate) ([place]) 08/04/2011  . Coronary atherosclerosis of unspecified type of vessel, native or graft    MI x2  . Depression   . Diabetes mellitus without complication ([place])   . Expressive aphasia   . Glaucoma   . Hemorrhoids   . Hyperlipidemia, mixed   . Kidney stones   . Polyclonal gammopathy 01/29/2012  . PVD (peripheral vascular disease) ([place])   . Stroke ([place])   . Unspecified essential hypertension     Past Surgical History:  Procedure Laterality Date  . ABDOMINAL HYSTERECTOMY    . APPENDECTOMY    . CATARACT EXTRACTION    . CHOLECYSTECTOMY    . COLONOSCOPY  09/26/08   XQJ:JHERDE rectum/diminutive rectosigmoid polyp/pan colonic diverticula  . COLONOSCOPY N/A 03/14/2015   Procedure: COLONOSCOPY;  Surgeon: Daneil Dolin, MD;  [facility];  Service: Endoscopy;  Laterality: N/A;  1030 - moved to 10:15 - office to notify  . KIDNEY STONE SURGERY    . laparoectomy    . polyectomy    . TUBAL  LIGATION      There were no vitals filed for this visit.    Subjective Assessment - 09/11/20 1540    Subjective Patient is a 80 y.o. female who presents to physical therapy with c/o weakness following a fall about 5 months ago. She went to a neurologist who said she didn't have a stroke. She is having trouble with walking, standing, transfers. She just got a wheel chair about month ago to get around. She was using a walker before that. Her main goal is to get back to walking.    Limitations Standing;Walking;House hold activities    Patient Stated Goals get to walking    Currently in Pain? No/denies              [facility] PT Assessment - 09/11/20 0001      Assessment   Medical Diagnosis DM with neuropathy, gait abnormality, chronic LBP,  with L sciatica, weakness    Referring Provider (PT) Marcial Pacas MD    Onset Date/Surgical Date 12/28/19    Prior Therapy [facility]      Precautions   Precautions Fall      Restrictions   Weight Bearing Restrictions No      Balance Screen   Has the patient fallen in the past 6 months  Yes    How many times? 2    Has the patient had a decrease in activity level because of a fear of falling?  Yes    Is the patient reluctant to leave their home because of a fear of falling?  Yes      Prior Function   Level of Independence Needs assistance with ADLs;Needs assistance with homemaking   was walking with RW   Vocation Retired      [employer]   Overall Cognitive Status Within Functional Limits for tasks assessed      Observation/Other Assessments   Observations seated in wheelchair    Focus on Therapeutic Outcomes (FOTO)  n/a      ROM / Strength   AROM / PROM / Strength AROM;Strength      AROM   Overall AROM Comments lacking L TKE      Strength   Strength Assessment Site Hip;Knee;Ankle    Right/Left Hip Right;Left    Right Hip Flexion 4/5    Left Hip Flexion 3+/5    Right/Left Knee Right;Left    Right Knee Flexion 4+/5    Right Knee Extension  4+/5    Left Knee Flexion 3/5    Left Knee Extension 3-/5    Right/Left Ankle Right;Left    Right Ankle Dorsiflexion 5/5    Left Ankle Dorsiflexion 3/5      Palpation   Palpation comment TTP medial joint line, popliteal fossa L knee      Transfers   Comments transfer to standing with use of hands and bilateral HHA to come to full standing, LE trembling upon standing                      Objective measurements completed on examination: See above findings.       [facility] Adult PT Treatment/Exercise - 09/11/20 0001      Exercises   Exercises Knee/Hip      Knee/Hip Exercises: Seated   Long Arc Quad Both;10 reps    Long Arc Quad Limitations 5 second holds    Knee/Hip Flexion alternating marches 10x bilateral 5 second holds, ankle pumps 20x                  PT Education - 09/11/20 1540    Education Details Patient educated on exam findings, POC, scope of PT, HEP    Person(s) Educated Patient    Methods Explanation;Demonstration;Handout    Comprehension Verbalized understanding;Returned demonstration            PT Short Term Goals - 09/11/20 1620      PT SHORT TERM GOAL #1   Title Patient will be independent with HEP in order to improve functional outcomes.    Time 3    Period Weeks    Status New    Target Date 10/02/20      PT SHORT TERM GOAL #2   Title Patient will report at least 25% improvement in symptoms for improved quality of life.    Time 3    Period Weeks    Status New    Target Date 10/02/20             PT Long Term Goals - 09/11/20 1621      PT LONG TERM GOAL #1   Title Patient will report at least 75% improvement in symptoms for improved quality of life.    Time 6    Period Weeks    Status New  Target Date 10/23/20      PT LONG TERM GOAL #2   Title Patient will be able to complete 5x STS in under 11.4 seconds in order to reduce the risk of falls.    Time 6    Period Weeks    Status New    Target Date 10/23/20       PT LONG TERM GOAL #3   Title Patient will be able to ambulate at least 100 feet with RW with supervision for improved household mobility.    Time 6    Period Weeks    Status New    Target Date 10/23/20      PT LONG TERM GOAL #4   Title Patient will be able to stand at least 5 minutes for improved ability to make a sandwich.    Time 6    Period Weeks    Status New    Target Date 10/23/20                  Plan - 09/11/20 1615    Clinical Impression Statement Patient is a 80 y.o. female who presents to physical therapy with c/o weakness and knee pain following a fall about 5 months ago. She presents with pain limited deficits in bilateral LE and L knee strength, ROM, endurance, transfers, gait, balance, and functional mobility with ADL. She is having to modify and restrict ADL as indicated by subjective information and objective measures which is affecting overall participation. Patient will benefit from skilled physical therapy in order to improve function and reduce impairment.    Personal Factors and Comorbidities Age;Behavior Pattern;Social Background;Comorbidity 3+;Fitness;Time since onset of injury/illness/exacerbation    Comorbidities HTN, DM with neuropathy, chronic back pain, falls    Examination-Activity Limitations Bend;Lift;Locomotion Level;Hygiene/Grooming;Transfers;Stand;Stairs;Squat    Examination-Participation Restrictions Lawyer;Shop;Cleaning;Community Activity;Meal Prep;Laundry    Stability/Clinical Decision Making Stable/Uncomplicated    Clinical Decision Making Low    Rehab Potential Fair    PT Frequency 2x / week    PT Duration 6 weeks    PT Treatment/Interventions ADLs/Self Care Home Management;Aquatic Therapy;Cryotherapy;Electrical Stimulation;Iontophoresis 4mg /ml Dexamethasone;Moist Heat;Traction;Ultrasound;DME Instruction;Gait training;Stair training;Functional mobility training;Therapeutic exercise;Therapeutic activities;Neuromuscular  re-education;Patient/family education;Orthotic Fit/Training;Manual techniques;Manual lymph drainage;Compression bandaging;Scar mobilization;Passive range of motion;Dry needling;Energy conservation;Splinting;Taping;Vasopneumatic Device    PT Next Visit Plan continue with seated/table exercises for strength and endurance, progress to standing, gait, and balance training as able    PT Home Exercise Plan 5/17 marching, LAQ, ankle pumps    Consulted and Agree with Plan of Care Patient           Patient will benefit from skilled therapeutic intervention in order to improve the following deficits and impairments:  Abnormal gait,Decreased endurance,Increased edema,Decreased activity tolerance,Decreased knowledge of use of DME,Decreased balance,Decreased mobility,Difficulty walking,Decreased strength,Pain,Decreased range of motion,Improper body mechanics,Impaired flexibility  Visit Diagnosis: Muscle weakness (generalized)  Other abnormalities of gait and mobility  Other symptoms and signs involving the musculoskeletal system  Left knee pain, unspecified chronicity     Problem List Patient Active Problem List   Diagnosis Date Noted  . Weakness 08/13/2020  . DM type 2 with diabetic peripheral neuropathy ([place]) 07/09/2020  . Gait abnormality 07/09/2020  . Chronic left-sided low back pain with left-sided sciatica 07/09/2020  . History of colonic polyps   . Diverticulosis of colon without hemorrhage   . Hx of adenomatous colonic polyps 03/05/2015  . Polyclonal gammopathy 01/29/2012  . Chronic kidney disease (CKD), stage III (moderate) ([place]) 08/04/2011  . Kidney stones 08/04/2011  .  Pyelonephritis, acute 08/02/2011  . Dehydration 08/02/2011  . Nausea 08/02/2011  . DM type 2, uncontrolled, with renal complications ([place]) 16/24/4695  . Incontinence 08/02/2011  . Obesity 08/02/2011  . ARF (acute renal failure) ([place]) 08/02/2011  . CAROTID STENOSIS 09/20/2008  . CHEST PAIN 09/20/2008  . DM  09/19/2008  . HYPERLIPIDEMIA-MIXED 09/19/2008  . Essential hypertension 09/19/2008  . Coronary atherosclerosis 09/19/2008  . PVD 09/19/2008  . RENAL INSUFFICIENCY 09/19/2008  . CONTRAST DYE ALLERGY, HX OF 09/19/2008  . HEMORRHOIDS 09/12/2008  . Constipation 09/12/2008  . HEMATOCHEZIA 09/12/2008    4:25 PM, 09/11/20 Mearl Latin PT, DPT Physical Therapist at [place], [place], [address] Phone: [phone]   Fax:  [phone]  Name: JAZZLYN HUIZENGA MRN: [id number] Date of Birth: Sep 19, 1940

## 2020-09-11 NOTE — Patient Instructions (Signed)
Access Code: [facility] URL: [email or web address] Date: 09/11/2020 Prepared by: Margie Billet  Exercises Seated March - 1-2 x daily - 7 x weekly - 2 sets - 5 reps - 5 second hold Seated Long Arc Quad - 1-2 x daily - 7 x weekly - 2 sets - 5 reps Seated Ankle Pumps - 1-2 x daily - 7 x weekly - 2 sets - 10 reps

## 2020-09-13 ENCOUNTER — Ambulatory Visit (HOSPITAL_COMMUNITY): Payer: PPO | Admitting: Physical Therapy

## 2020-09-13 ENCOUNTER — Encounter (HOSPITAL_COMMUNITY): Payer: Self-pay | Admitting: Physical Therapy

## 2020-09-13 ENCOUNTER — Other Ambulatory Visit: Payer: Self-pay

## 2020-09-13 DIAGNOSIS — M6281 Muscle weakness (generalized): Secondary | ICD-10-CM

## 2020-09-13 DIAGNOSIS — R2689 Other abnormalities of gait and mobility: Secondary | ICD-10-CM

## 2020-09-13 DIAGNOSIS — R29898 Other symptoms and signs involving the musculoskeletal system: Secondary | ICD-10-CM

## 2020-09-13 NOTE — Therapy (Signed)
[place], [place], [address] Phone: [phone]   Fax:  [phone]  Physical Therapy Treatment  Patient Details  Name: [name] MRN: [id number] Date of Birth: Nov 16, 1940 Referring Provider (PT): Marcial Pacas MD   Encounter Date: 09/13/2020   PT End of Session - 09/13/20 1620    Visit Number 2    Number of Visits 12    Date for PT Re-Evaluation 10/23/20    Authorization Type [employer] (based on medical necessity, follow MCR guidelines)    Progress Note Due on Visit 10    PT Start Time 1615    PT Stop Time 1655    PT Time Calculation (min) 40 min    Activity Tolerance Patient tolerated treatment well    Behavior During Therapy [facility] for tasks assessed/performed           Past Medical History:  Diagnosis Date  . Anxiety disorder   . Aphasia   . Carotid stenosis   . Cat allergies   . Cerebrovascular disease    CVA-left sided weakness  . Chronic kidney disease (CKD), stage III (moderate) ([place]) 08/04/2011  . Coronary atherosclerosis of unspecified type of vessel, native or graft    MI x2  . Depression   . Diabetes mellitus without complication ([place])   . Expressive aphasia   . Glaucoma   . Hemorrhoids   . Hyperlipidemia, mixed   . Kidney stones   . Polyclonal gammopathy 01/29/2012  . PVD (peripheral vascular disease) ([place])   . Stroke ([place])   . Unspecified essential hypertension     Past Surgical History:  Procedure Laterality Date  . ABDOMINAL HYSTERECTOMY    . APPENDECTOMY    . CATARACT EXTRACTION    . CHOLECYSTECTOMY    . COLONOSCOPY  09/26/08   [id number] rectum/diminutive rectosigmoid polyp/pan colonic diverticula  . COLONOSCOPY N/A 03/14/2015   Procedure: COLONOSCOPY;  Surgeon: Daneil Dolin, MD;  [facility];  Service: Endoscopy;  Laterality: N/A;  1030 - moved to 10:15 - office to notify  . KIDNEY STONE SURGERY    . laparoectomy    . polyectomy    . TUBAL  LIGATION      There were no vitals filed for this visit.   Subjective Assessment - 09/13/20 1621    Subjective States that she was a little sore in the legs from doing the exercises. Current pain is 5/10 in her legs and described as soreness.    Limitations Standing;Walking;House hold activities    Patient Stated Goals get to walking    Currently in Pain? Yes    Pain Score 5     Pain Location Leg    Pain Orientation Right;Left              [facility] PT Assessment - 09/13/20 0001      Assessment   Medical Diagnosis DM with neuropathy, gait abnormality, chronic LBP,  with L sciatica, weakness    Referring Provider (PT) Marcial Pacas MD                         [facility] Adult PT Treatment/Exercise - 09/13/20 0001      Knee/Hip Exercises: Standing   Other Standing Knee Exercises trialed stepping in// bars - patietn collapses even with mod assist --> mini marches right in front of WC with mod assist and knee blocked 3x5 bilateral    Other Standing Knee Exercises  standing with bilateral UE support in bars 2.5 minutes x2      Knee/Hip Exercises: Seated   Long Arc Quad Both;10 reps;2 sets    Knee/Hip Flexion alternating marches 10x bilateral 5 second holds, ankle pumps 20x    Sit to [employer] --   12 reps, mod assist hands on thighs (instead of pulling herself forward)                   PT Short Term Goals - 09/11/20 1620      PT SHORT TERM GOAL #1   Title Patient will be independent with HEP in order to improve functional outcomes.    Time 3    Period Weeks    Status New    Target Date 10/02/20      PT SHORT TERM GOAL #2   Title Patient will report at least 25% improvement in symptoms for improved quality of life.    Time 3    Period Weeks    Status New    Target Date 10/02/20             PT Long Term Goals - 09/11/20 1621      PT LONG TERM GOAL #1   Title Patient will report at least 75% improvement in symptoms for improved quality of life.    Time 6     Period Weeks    Status New    Target Date 10/23/20      PT LONG TERM GOAL #2   Title Patient will be able to complete 5x STS in under 11.4 seconds in order to reduce the risk of falls.    Time 6    Period Weeks    Status New    Target Date 10/23/20      PT LONG TERM GOAL #3   Title Patient will be able to ambulate at least 100 feet with RW with supervision for improved household mobility.    Time 6    Period Weeks    Status New    Target Date 10/23/20      PT LONG TERM GOAL #4   Title Patient will be able to stand at least 5 minutes for improved ability to make a sandwich.    Time 6    Period Weeks    Status New    Target Date 10/23/20                 Plan - 09/13/20 1620    Clinical Impression Statement Trialed standing and stepping which was very difficult for patient as she collapses at her knees and doesn't trust her legs. Patient became upset with stepping exercise secondary to how weak she was. Encouraged patient and continued with standing endurance. Educated patient in practicing pushing with sit to stand instead of pulling herself forward. Able to stand up with min guard assist 2x by end of session. Will continue with current POC.    Personal Factors and Comorbidities Age;Behavior Pattern;Social Background;Comorbidity 3+;Fitness;Time since onset of injury/illness/exacerbation    Comorbidities HTN, DM with neuropathy, chronic back pain, falls    Examination-Activity Limitations Bend;Lift;Locomotion Level;Hygiene/Grooming;Transfers;Stand;Stairs;Squat    Examination-Participation Restrictions Lawyer;Shop;Cleaning;Community Activity;Meal Prep;Laundry    Stability/Clinical Decision Making Stable/Uncomplicated    Rehab Potential Fair    PT Frequency 2x / week    PT Duration 6 weeks    PT Treatment/Interventions ADLs/Self Care Home Management;Aquatic Therapy;Cryotherapy;Electrical Stimulation;Iontophoresis 4mg /ml Dexamethasone;Moist  Heat;Traction;Ultrasound;DME Instruction;Gait training;Stair training;Functional mobility training;Therapeutic exercise;Therapeutic activities;Neuromuscular re-education;Patient/family education;Orthotic Fit/Training;Manual  techniques;Manual lymph drainage;Compression bandaging;Scar mobilization;Passive range of motion;Dry needling;Energy conservation;Splinting;Taping;Vasopneumatic Device    PT Next Visit Plan continue with seated/table exercises for strength and endurance, progress to standing, gait, and balance training as able    PT Home Exercise Plan 5/17 marching, LAQ, ankle pumps; 5/19 standing endurance at counter    Consulted and Agree with Plan of Care Patient           Patient will benefit from skilled therapeutic intervention in order to improve the following deficits and impairments:  Abnormal gait,Decreased endurance,Increased edema,Decreased activity tolerance,Decreased knowledge of use of DME,Decreased balance,Decreased mobility,Difficulty walking,Decreased strength,Pain,Decreased range of motion,Improper body mechanics,Impaired flexibility  Visit Diagnosis: Muscle weakness (generalized)  Other abnormalities of gait and mobility  Other symptoms and signs involving the musculoskeletal system     Problem List Patient Active Problem List   Diagnosis Date Noted  . Weakness 08/13/2020  . DM type 2 with diabetic peripheral neuropathy ([place]) 07/09/2020  . Gait abnormality 07/09/2020  . Chronic left-sided low back pain with left-sided sciatica 07/09/2020  . History of colonic polyps   . Diverticulosis of colon without hemorrhage   . Hx of adenomatous colonic polyps 03/05/2015  . Polyclonal gammopathy 01/29/2012  . Chronic kidney disease (CKD), stage III (moderate) ([place]) 08/04/2011  . Kidney stones 08/04/2011  . Pyelonephritis, acute 08/02/2011  . Dehydration 08/02/2011  . Nausea 08/02/2011  . DM type 2, uncontrolled, with renal complications ([place]) 49/17/9150  .  Incontinence 08/02/2011  . Obesity 08/02/2011  . ARF (acute renal failure) ([place]) 08/02/2011  . CAROTID STENOSIS 09/20/2008  . CHEST PAIN 09/20/2008  . DM 09/19/2008  . HYPERLIPIDEMIA-MIXED 09/19/2008  . Essential hypertension 09/19/2008  . Coronary atherosclerosis 09/19/2008  . PVD 09/19/2008  . RENAL INSUFFICIENCY 09/19/2008  . CONTRAST DYE ALLERGY, HX OF 09/19/2008  . HEMORRHOIDS 09/12/2008  . Constipation 09/12/2008  . HEMATOCHEZIA 09/12/2008   5:00 PM, 09/13/20 Jerene Pitch, DPT Physical Therapy with [facility]  [phone] office   [place] [address] [place], [place], [address] Phone: [phone]   Fax:  [phone]  Name: [name] MRN: [id number] Date of Birth: 24-Jul-1940

## 2020-09-18 ENCOUNTER — Ambulatory Visit (HOSPITAL_COMMUNITY): Payer: PPO

## 2020-09-18 ENCOUNTER — Telehealth (HOSPITAL_COMMUNITY): Payer: Self-pay

## 2020-09-18 NOTE — Telephone Encounter (Signed)
Called pt to try to reschedule apt on 5/26.  Husband stated to go ahead and cancel this apt.  Stated he wishes for copy of exercises to be complete at home, wife may wish for earlier D/C.   Ihor Austin, LPTA/CLT; Delana Meyer [phone]

## 2020-09-20 ENCOUNTER — Encounter (HOSPITAL_COMMUNITY): Payer: PPO

## 2020-09-21 DIAGNOSIS — D631 Anemia in chronic kidney disease: Secondary | ICD-10-CM | POA: Diagnosis not present

## 2020-09-21 DIAGNOSIS — R809 Proteinuria, unspecified: Secondary | ICD-10-CM | POA: Diagnosis not present

## 2020-09-21 DIAGNOSIS — E872 Acidosis: Secondary | ICD-10-CM | POA: Diagnosis not present

## 2020-09-21 DIAGNOSIS — N189 Chronic kidney disease, unspecified: Secondary | ICD-10-CM | POA: Diagnosis not present

## 2020-09-21 DIAGNOSIS — E1129 Type 2 diabetes mellitus with other diabetic kidney complication: Secondary | ICD-10-CM | POA: Diagnosis not present

## 2020-09-21 DIAGNOSIS — E1122 Type 2 diabetes mellitus with diabetic chronic kidney disease: Secondary | ICD-10-CM | POA: Diagnosis not present

## 2020-09-21 DIAGNOSIS — E211 Secondary hyperparathyroidism, not elsewhere classified: Secondary | ICD-10-CM | POA: Diagnosis not present

## 2020-09-27 ENCOUNTER — Telehealth (HOSPITAL_COMMUNITY): Payer: Self-pay | Admitting: Physical Therapy

## 2020-09-27 ENCOUNTER — Ambulatory Visit (HOSPITAL_COMMUNITY): Payer: PPO | Admitting: Physical Therapy

## 2020-09-27 DIAGNOSIS — R809 Proteinuria, unspecified: Secondary | ICD-10-CM | POA: Diagnosis not present

## 2020-09-27 DIAGNOSIS — N189 Chronic kidney disease, unspecified: Secondary | ICD-10-CM | POA: Diagnosis not present

## 2020-09-27 DIAGNOSIS — E1129 Type 2 diabetes mellitus with other diabetic kidney complication: Secondary | ICD-10-CM | POA: Diagnosis not present

## 2020-09-27 DIAGNOSIS — E1122 Type 2 diabetes mellitus with diabetic chronic kidney disease: Secondary | ICD-10-CM | POA: Diagnosis not present

## 2020-09-27 DIAGNOSIS — D631 Anemia in chronic kidney disease: Secondary | ICD-10-CM | POA: Diagnosis not present

## 2020-09-27 DIAGNOSIS — E211 Secondary hyperparathyroidism, not elsewhere classified: Secondary | ICD-10-CM | POA: Diagnosis not present

## 2020-09-27 NOTE — Telephone Encounter (Signed)
Called pt re missed appointment.  Pt states that she can not afford $30.00 each visit and would like for [place] to discharge her.  Pt requests exercises be sent to her so she can be doing them at home.  Rayetta Humphrey, [place] CLT [phone]

## 2020-09-27 NOTE — Therapy (Deleted)
[facility] [address] [place], [place], [address] Phone: [phone]   Fax:  [phone]  Patient Details  Name: [name] MRN: [id number] Date of Birth: 04-Nov-1940 Referring Provider:  Redmond School, MD  Encounter Date: 09/27/2020 PHYSICAL THERAPY DISCHARGE SUMMARY  Visits from Start of Care: 2  Current functional level related to goals / functional outcomes: Remains weak    Remaining deficits: Strength and mobility    Education / Equipment: HEP  Plan: Patient agrees to discharge.  Patient goals were not met. Patient is being discharged due to financial reasons.  ?????    Rayetta Humphrey, PT CLT [phone] 09/27/2020, 4:34 PM  [facility] [address] [place], [place], [address] Phone: [phone]   Fax:  [phone]

## 2020-10-01 ENCOUNTER — Encounter (HOSPITAL_COMMUNITY): Payer: PPO

## 2020-10-03 ENCOUNTER — Encounter (HOSPITAL_COMMUNITY): Payer: PPO | Admitting: Physical Therapy

## 2020-10-08 ENCOUNTER — Encounter (HOSPITAL_COMMUNITY): Payer: PPO

## 2020-10-10 ENCOUNTER — Encounter (HOSPITAL_COMMUNITY): Payer: PPO | Admitting: Physical Therapy

## 2020-10-10 DIAGNOSIS — E538 Deficiency of other specified B group vitamins: Secondary | ICD-10-CM | POA: Diagnosis not present

## 2020-10-15 ENCOUNTER — Encounter (HOSPITAL_COMMUNITY): Payer: PPO

## 2020-10-15 DIAGNOSIS — E1122 Type 2 diabetes mellitus with diabetic chronic kidney disease: Secondary | ICD-10-CM | POA: Diagnosis not present

## 2020-10-15 DIAGNOSIS — N189 Chronic kidney disease, unspecified: Secondary | ICD-10-CM | POA: Diagnosis not present

## 2020-10-15 DIAGNOSIS — D631 Anemia in chronic kidney disease: Secondary | ICD-10-CM | POA: Diagnosis not present

## 2020-10-15 DIAGNOSIS — R809 Proteinuria, unspecified: Secondary | ICD-10-CM | POA: Diagnosis not present

## 2020-10-15 DIAGNOSIS — E211 Secondary hyperparathyroidism, not elsewhere classified: Secondary | ICD-10-CM | POA: Diagnosis not present

## 2020-10-15 DIAGNOSIS — E1129 Type 2 diabetes mellitus with other diabetic kidney complication: Secondary | ICD-10-CM | POA: Diagnosis not present

## 2020-10-17 ENCOUNTER — Encounter (HOSPITAL_COMMUNITY): Payer: PPO | Admitting: Physical Therapy

## 2020-10-22 ENCOUNTER — Encounter (HOSPITAL_COMMUNITY): Payer: PPO

## 2020-10-24 ENCOUNTER — Encounter (HOSPITAL_COMMUNITY): Payer: PPO | Admitting: Physical Therapy

## 2020-10-26 DIAGNOSIS — E1122 Type 2 diabetes mellitus with diabetic chronic kidney disease: Secondary | ICD-10-CM | POA: Diagnosis not present

## 2020-10-26 DIAGNOSIS — R809 Proteinuria, unspecified: Secondary | ICD-10-CM | POA: Diagnosis not present

## 2020-10-26 DIAGNOSIS — N189 Chronic kidney disease, unspecified: Secondary | ICD-10-CM | POA: Diagnosis not present

## 2020-10-26 DIAGNOSIS — E1129 Type 2 diabetes mellitus with other diabetic kidney complication: Secondary | ICD-10-CM | POA: Diagnosis not present

## 2020-10-26 DIAGNOSIS — E211 Secondary hyperparathyroidism, not elsewhere classified: Secondary | ICD-10-CM | POA: Diagnosis not present

## 2020-10-26 DIAGNOSIS — D631 Anemia in chronic kidney disease: Secondary | ICD-10-CM | POA: Diagnosis not present

## 2020-10-31 DIAGNOSIS — N189 Chronic kidney disease, unspecified: Secondary | ICD-10-CM | POA: Diagnosis not present

## 2020-10-31 DIAGNOSIS — R809 Proteinuria, unspecified: Secondary | ICD-10-CM | POA: Diagnosis not present

## 2020-10-31 DIAGNOSIS — N170 Acute kidney failure with tubular necrosis: Secondary | ICD-10-CM | POA: Diagnosis not present

## 2020-10-31 DIAGNOSIS — E211 Secondary hyperparathyroidism, not elsewhere classified: Secondary | ICD-10-CM | POA: Diagnosis not present

## 2020-10-31 DIAGNOSIS — D631 Anemia in chronic kidney disease: Secondary | ICD-10-CM | POA: Diagnosis not present

## 2020-10-31 DIAGNOSIS — E1122 Type 2 diabetes mellitus with diabetic chronic kidney disease: Secondary | ICD-10-CM | POA: Diagnosis not present

## 2020-10-31 DIAGNOSIS — E1129 Type 2 diabetes mellitus with other diabetic kidney complication: Secondary | ICD-10-CM | POA: Diagnosis not present

## 2020-11-13 DIAGNOSIS — E538 Deficiency of other specified B group vitamins: Secondary | ICD-10-CM | POA: Diagnosis not present

## 2020-11-26 DIAGNOSIS — E1129 Type 2 diabetes mellitus with other diabetic kidney complication: Secondary | ICD-10-CM | POA: Diagnosis not present

## 2020-11-26 DIAGNOSIS — R809 Proteinuria, unspecified: Secondary | ICD-10-CM | POA: Diagnosis not present

## 2020-11-26 DIAGNOSIS — E211 Secondary hyperparathyroidism, not elsewhere classified: Secondary | ICD-10-CM | POA: Diagnosis not present

## 2020-11-26 DIAGNOSIS — E1122 Type 2 diabetes mellitus with diabetic chronic kidney disease: Secondary | ICD-10-CM | POA: Diagnosis not present

## 2020-11-26 DIAGNOSIS — N189 Chronic kidney disease, unspecified: Secondary | ICD-10-CM | POA: Diagnosis not present

## 2020-11-26 DIAGNOSIS — D631 Anemia in chronic kidney disease: Secondary | ICD-10-CM | POA: Diagnosis not present

## 2020-11-26 DIAGNOSIS — N170 Acute kidney failure with tubular necrosis: Secondary | ICD-10-CM | POA: Diagnosis not present

## 2020-11-29 DIAGNOSIS — N189 Chronic kidney disease, unspecified: Secondary | ICD-10-CM | POA: Diagnosis not present

## 2020-11-29 DIAGNOSIS — R809 Proteinuria, unspecified: Secondary | ICD-10-CM | POA: Diagnosis not present

## 2020-11-29 DIAGNOSIS — E1129 Type 2 diabetes mellitus with other diabetic kidney complication: Secondary | ICD-10-CM | POA: Diagnosis not present

## 2020-11-29 DIAGNOSIS — E211 Secondary hyperparathyroidism, not elsewhere classified: Secondary | ICD-10-CM | POA: Diagnosis not present

## 2020-11-29 DIAGNOSIS — E1122 Type 2 diabetes mellitus with diabetic chronic kidney disease: Secondary | ICD-10-CM | POA: Diagnosis not present

## 2020-11-29 DIAGNOSIS — D631 Anemia in chronic kidney disease: Secondary | ICD-10-CM | POA: Diagnosis not present

## 2020-12-11 DIAGNOSIS — E538 Deficiency of other specified B group vitamins: Secondary | ICD-10-CM | POA: Diagnosis not present

## 2020-12-21 DIAGNOSIS — E1142 Type 2 diabetes mellitus with diabetic polyneuropathy: Secondary | ICD-10-CM | POA: Diagnosis not present

## 2021-01-15 NOTE — Progress Notes (Signed)
HPI: FU CAD; last catheterization in September 2008. At that time, she was found to have significant left circumflex disease as well as marginal disease to correlate with perfusion abnormality on her Myoview. However, it was felt that these are small vessels and would be difficult to intervene on either percutaneously or from a surgical standpoint. Her only other disease was in the PDA at 50-70%. Nuclear study May 2016 showed ejection fraction 76% and inferior lateral ischemia consistent with known circumflex disease. Carotid dopplers 11/21 revealed 40-59 right and 1 to 39% left. Since I last saw her,   Current Outpatient Medications  Medication Sig Dispense Refill   acetaminophen (TYLENOL) 500 MG tablet Take 1,000 mg by mouth every 6 (six) hours as needed for mild pain or moderate pain.     alendronate (FOSAMAX) 70 MG tablet Take by mouth.     ALPRAZolam (XANAX) 0.25 MG tablet Take 0.25 mg by mouth 3 (three) times daily as needed.     amLODipine (NORVASC) 10 MG tablet Take 10 mg by mouth daily.     aspirin 81 MG tablet Take 81 mg by mouth every morning.     cyanocobalamin (,VITAMIN B-12,) 1000 MCG/ML injection Inject 1,000 mcg into the muscle every 30 (thirty) days.     diazepam (VALIUM) 10 MG tablet Take 10 mg by mouth every 6 (six) hours as needed. Takes 3-4 times per day as needed for being upset. Always takes 1 tablet at bedtime     fluticasone (FLONASE) 50 MCG/ACT nasal spray SMARTSIG:1 Spray(s) Both Nares Twice Daily PRN     gabapentin (NEURONTIN) 100 MG capsule Take 1 capsule (100 mg total) by mouth 3 (three) times daily. 90 capsule 2   glipiZIDE (GLUCOTROL XL) 10 MG 24 hr tablet Take 10 mg by mouth 2 (two) times daily.     glucose blood (ONETOUCH ULTRA) test strip      hydrALAZINE (APRESOLINE) 25 MG tablet Take 25 mg by mouth 3 (three) times daily.     HYDROcodone-acetaminophen (NORCO/VICODIN) 5-325 MG tablet Take 1 tablet by mouth every 6 (six) hours as needed for moderate pain. 8  tablet 0   insulin glargine (LANTUS) 100 UNIT/ML injection Inject 20-50 Units into the skin at bedtime. Adjusts amount based on her blood sugar levels     isosorbide mononitrate (IMDUR) 30 MG 24 hr tablet Take 30 mg by mouth daily.     losartan (COZAAR) 25 MG tablet Take by mouth. Patient takes 1 tablet daily     Melatonin 3 MG TABS Take 3 mg by mouth at bedtime as needed (sleep).      metoprolol tartrate (LOPRESSOR) 50 MG tablet Take 50 mg by mouth 2 (two) times daily.     nitroGLYCERIN (NITROSTAT) 0.4 MG SL tablet Place 0.4 mg under the tongue every 5 (five) minutes x 3 doses as needed (last dose 1 week ago 03/05/2015). For chest pain     ondansetron (ZOFRAN ODT) 4 MG disintegrating tablet Take 1 tablet (4 mg total) by mouth every 8 (eight) hours as needed for nausea or vomiting. 12 tablet 0   ONETOUCH ULTRA test strip USE TO TEST 4 TIMES3DAILY.     OVER THE COUNTER MEDICATION Take 1 capsule by mouth daily. MEGA RED KRILL OIL     pantoprazole (PROTONIX) 40 MG tablet Take 40 mg by mouth daily.     PROLIA 60 MG/ML SOSY injection      rosuvastatin (CRESTOR) 40 MG tablet TAKE ONE TABLET  BY MOUTH AT BEDTIME. 30 tablet 11   sodium bicarbonate 650 MG tablet Take 650 mg by mouth 3 (three) times daily.     sodium polystyrene (KAYEXALATE) powder Take by mouth.     calcitonin, salmon, (MIACALCIN/FORTICAL) 200 UNIT/ACT nasal spray 1 spray daily. (Patient not taking: Reported on 01/18/2021)     docusate sodium (COLACE) 100 MG capsule Take 100 mg by mouth 2 (two) times daily. (Patient not taking: Reported on 01/18/2021)     furosemide (LASIX) 20 MG tablet Take 20 mg by mouth as needed. For decreasing your potassium level and for fluid retention.     No current facility-administered medications for this visit.     Past Medical History:  Diagnosis Date   Anxiety disorder    Aphasia    Carotid stenosis    Cat allergies    Cerebrovascular disease    CVA-left sided weakness   Chronic kidney disease  (CKD), stage III (moderate) (HCC) 08/04/2011   Coronary atherosclerosis of unspecified type of vessel, native or graft    MI x2   Depression    Diabetes mellitus without complication ([place])    Expressive aphasia    Glaucoma    Hemorrhoids    Hyperlipidemia, mixed    Kidney stones    Polyclonal gammopathy 01/29/2012   PVD (peripheral vascular disease) ([place])    Stroke ([place])    Unspecified essential hypertension     Past Surgical History:  Procedure Laterality Date   ABDOMINAL HYSTERECTOMY     APPENDECTOMY     CATARACT EXTRACTION     CHOLECYSTECTOMY     COLONOSCOPY  09/26/08   LPF:[id number] rectum/diminutive rectosigmoid polyp/pan colonic diverticula   COLONOSCOPY N/A 03/14/2015   Procedure: COLONOSCOPY;  Surgeon: Daneil Dolin, MD;  Location: [facility];  Service: Endoscopy;  Laterality: N/A;  1030 - moved to 10:15 - office to notify   KIDNEY STONE SURGERY     laparoectomy     polyectomy     TUBAL LIGATION      Social History   Socioeconomic History   Marital status: Married    Spouse name: Not on file   Number of children: 2   Years of education: 12   Highest education level: High school graduate  Occupational History   Occupation: Retired  Tobacco Use   Smoking status: Former   Smokeless tobacco: Never  Substance and Sexual Activity   Alcohol use: No   Drug use: No   Sexual activity: Not on file  Other Topics Concern   Not on file  Social History Narrative   Lives with spouse.   Right-handed.   No daily use of caffeine.   Social Determinants of Health   Financial Resource Strain: Not on file  Food Insecurity: Not on file  Transportation Needs: Not on file  Physical Activity: Not on file  Stress: Not on file  Social Connections: Not on file  Intimate Partner Violence: Not on file    Family History  Problem Relation Age of Onset   Heart attack Father    Cancer Father    Kidney disease Mother    Diabetes Mother    Colon cancer Other        aunt    Colon polyps Son    Colon polyps Other        siblings   Diabetes Sister    Diabetes Brother    Heart disease Paternal Grandmother     ROS: no fevers or chills, productive  cough, hemoptysis, dysphasia, odynophagia, melena, hematochezia, dysuria, hematuria, rash, seizure activity, orthopnea, PND, pedal edema, claudication. Remaining systems are negative.  Physical Exam: Well-developed well-nourished in no acute distress.  Skin is warm and dry.  HEENT is normal.  Neck is supple.  Chest is clear to auscultation with normal expansion.  Cardiovascular exam is regular rate and rhythm.  Abdominal exam nontender or distended. No masses palpated. Extremities show no edema. neuro grossly intact  ECG-sinus bradycardia at a rate of 56, first-degree AV block, normal axis, nonspecific ST changes, low voltage.  Personally reviewed  A/P  1 coronary artery disease-patient denies exertional chest pain.  She is being treated medically as previous nuclear study showed ischemia in the distribution of her known coronary disease and also patient with history of renal insufficiency and would be at risk for contrast nephropathy with catheterization.  Continue aspirin and statin.  We will arrange echocardiogram to reassess LV function.  2 hypertension-blood pressure controlled.  Continue present medical regimen.  3 hyperlipidemia-continue statin.  4 carotid artery disease-plan follow-up carotid Dopplers.  5 chronic stage IV kidney disease-Per nephrology.  Kirk Ruths, MD

## 2021-01-16 DIAGNOSIS — E538 Deficiency of other specified B group vitamins: Secondary | ICD-10-CM | POA: Diagnosis not present

## 2021-01-16 DIAGNOSIS — Z23 Encounter for immunization: Secondary | ICD-10-CM | POA: Diagnosis not present

## 2021-01-18 ENCOUNTER — Ambulatory Visit: Payer: PPO | Admitting: Cardiology

## 2021-01-18 ENCOUNTER — Ambulatory Visit (HOSPITAL_COMMUNITY)
Admission: RE | Admit: 2021-01-18 | Discharge: 2021-01-18 | Disposition: A | Discharge status: Home / Self Care | Payer: PPO | Source: Ambulatory Visit | Attending: Cardiovascular Disease | Admitting: Cardiovascular Disease

## 2021-01-18 ENCOUNTER — Other Ambulatory Visit (HOSPITAL_COMMUNITY): Payer: Self-pay | Admitting: Cardiology

## 2021-01-18 ENCOUNTER — Other Ambulatory Visit: Payer: Self-pay

## 2021-01-18 ENCOUNTER — Encounter: Payer: Self-pay | Admitting: Cardiology

## 2021-01-18 VITALS — BP 115/47 | HR 56 | Ht 60.0 in | Wt 152.8 lb

## 2021-01-18 DIAGNOSIS — I6523 Occlusion and stenosis of bilateral carotid arteries: Secondary | ICD-10-CM

## 2021-01-18 DIAGNOSIS — I2510 Atherosclerotic heart disease of native coronary artery without angina pectoris: Secondary | ICD-10-CM

## 2021-01-18 DIAGNOSIS — I10 Essential (primary) hypertension: Secondary | ICD-10-CM | POA: Diagnosis not present

## 2021-01-18 NOTE — Patient Instructions (Signed)

## 2021-01-21 ENCOUNTER — Encounter: Payer: Self-pay | Admitting: *Deleted

## 2021-01-24 ENCOUNTER — Other Ambulatory Visit: Payer: Self-pay

## 2021-01-24 NOTE — Addendum Note (Signed)
Addended by: Orma Render on: 01/24/2021 03:15 PM   Modules accepted: Orders

## 2021-02-11 ENCOUNTER — Ambulatory Visit (HOSPITAL_COMMUNITY)
Admission: RE | Admit: 2021-02-11 | Discharge: 2021-02-11 | Disposition: A | Discharge status: Home / Self Care | Payer: PPO | Source: Ambulatory Visit | Attending: Cardiology | Admitting: Cardiology

## 2021-02-11 ENCOUNTER — Other Ambulatory Visit: Payer: Self-pay

## 2021-02-11 DIAGNOSIS — E538 Deficiency of other specified B group vitamins: Secondary | ICD-10-CM | POA: Diagnosis not present

## 2021-02-11 DIAGNOSIS — I2510 Atherosclerotic heart disease of native coronary artery without angina pectoris: Secondary | ICD-10-CM | POA: Diagnosis not present

## 2021-02-11 LAB — ECHOCARDIOGRAM COMPLETE
Area-P 1/2: 2.87 cm2
S' Lateral: 3 cm

## 2021-02-11 NOTE — Progress Notes (Signed)
*  PRELIMINARY RESULTS* Echocardiogram 2D Echocardiogram has been performed.  [name] 02/11/2021, 2:58 PM

## 2021-02-12 ENCOUNTER — Encounter: Payer: Self-pay | Admitting: *Deleted

## 2021-02-12 DIAGNOSIS — D239 Other benign neoplasm of skin, unspecified: Secondary | ICD-10-CM | POA: Diagnosis not present

## 2021-02-12 DIAGNOSIS — Z1283 Encounter for screening for malignant neoplasm of skin: Secondary | ICD-10-CM | POA: Diagnosis not present

## 2021-02-12 DIAGNOSIS — L570 Actinic keratosis: Secondary | ICD-10-CM | POA: Diagnosis not present

## 2021-03-01 DIAGNOSIS — D631 Anemia in chronic kidney disease: Secondary | ICD-10-CM | POA: Diagnosis not present

## 2021-03-01 DIAGNOSIS — E1122 Type 2 diabetes mellitus with diabetic chronic kidney disease: Secondary | ICD-10-CM | POA: Diagnosis not present

## 2021-03-01 DIAGNOSIS — E211 Secondary hyperparathyroidism, not elsewhere classified: Secondary | ICD-10-CM | POA: Diagnosis not present

## 2021-03-01 DIAGNOSIS — R809 Proteinuria, unspecified: Secondary | ICD-10-CM | POA: Diagnosis not present

## 2021-03-01 DIAGNOSIS — E1129 Type 2 diabetes mellitus with other diabetic kidney complication: Secondary | ICD-10-CM | POA: Diagnosis not present

## 2021-03-01 DIAGNOSIS — N189 Chronic kidney disease, unspecified: Secondary | ICD-10-CM | POA: Diagnosis not present

## 2021-03-08 DIAGNOSIS — N189 Chronic kidney disease, unspecified: Secondary | ICD-10-CM | POA: Diagnosis not present

## 2021-03-08 DIAGNOSIS — R809 Proteinuria, unspecified: Secondary | ICD-10-CM | POA: Diagnosis not present

## 2021-03-08 DIAGNOSIS — D631 Anemia in chronic kidney disease: Secondary | ICD-10-CM | POA: Diagnosis not present

## 2021-03-08 DIAGNOSIS — E1122 Type 2 diabetes mellitus with diabetic chronic kidney disease: Secondary | ICD-10-CM | POA: Diagnosis not present

## 2021-03-08 DIAGNOSIS — E1129 Type 2 diabetes mellitus with other diabetic kidney complication: Secondary | ICD-10-CM | POA: Diagnosis not present

## 2021-03-08 DIAGNOSIS — E211 Secondary hyperparathyroidism, not elsewhere classified: Secondary | ICD-10-CM | POA: Diagnosis not present

## 2021-03-15 DIAGNOSIS — E538 Deficiency of other specified B group vitamins: Secondary | ICD-10-CM | POA: Diagnosis not present

## 2021-03-25 ENCOUNTER — Other Ambulatory Visit (HOSPITAL_COMMUNITY): Payer: Self-pay | Admitting: Internal Medicine

## 2021-03-25 DIAGNOSIS — E538 Deficiency of other specified B group vitamins: Secondary | ICD-10-CM | POA: Diagnosis not present

## 2021-03-25 DIAGNOSIS — R059 Cough, unspecified: Secondary | ICD-10-CM | POA: Diagnosis not present

## 2021-03-25 DIAGNOSIS — Z6828 Body mass index (BMI) 28.0-28.9, adult: Secondary | ICD-10-CM | POA: Diagnosis not present

## 2021-03-25 DIAGNOSIS — E663 Overweight: Secondary | ICD-10-CM | POA: Diagnosis not present

## 2021-03-25 DIAGNOSIS — E1140 Type 2 diabetes mellitus with diabetic neuropathy, unspecified: Secondary | ICD-10-CM | POA: Diagnosis not present

## 2021-03-25 DIAGNOSIS — J189 Pneumonia, unspecified organism: Secondary | ICD-10-CM | POA: Diagnosis not present

## 2021-03-25 DIAGNOSIS — I10 Essential (primary) hypertension: Secondary | ICD-10-CM | POA: Diagnosis not present

## 2021-05-08 DIAGNOSIS — E538 Deficiency of other specified B group vitamins: Secondary | ICD-10-CM | POA: Diagnosis not present

## 2021-05-22 DIAGNOSIS — E1140 Type 2 diabetes mellitus with diabetic neuropathy, unspecified: Secondary | ICD-10-CM | POA: Diagnosis not present

## 2021-05-22 DIAGNOSIS — Z6828 Body mass index (BMI) 28.0-28.9, adult: Secondary | ICD-10-CM | POA: Diagnosis not present

## 2021-05-22 DIAGNOSIS — Z0000 Encounter for general adult medical examination without abnormal findings: Secondary | ICD-10-CM | POA: Diagnosis not present

## 2021-05-22 DIAGNOSIS — I10 Essential (primary) hypertension: Secondary | ICD-10-CM | POA: Diagnosis not present

## 2021-05-22 DIAGNOSIS — E663 Overweight: Secondary | ICD-10-CM | POA: Diagnosis not present

## 2021-05-22 DIAGNOSIS — E538 Deficiency of other specified B group vitamins: Secondary | ICD-10-CM | POA: Diagnosis not present

## 2021-05-22 DIAGNOSIS — H6120 Impacted cerumen, unspecified ear: Secondary | ICD-10-CM | POA: Diagnosis not present

## 2021-05-22 DIAGNOSIS — Z1331 Encounter for screening for depression: Secondary | ICD-10-CM | POA: Diagnosis not present

## 2021-05-22 DIAGNOSIS — K222 Esophageal obstruction: Secondary | ICD-10-CM | POA: Diagnosis not present

## 2021-05-23 ENCOUNTER — Other Ambulatory Visit (HOSPITAL_COMMUNITY): Payer: Self-pay | Admitting: Internal Medicine

## 2021-05-23 ENCOUNTER — Other Ambulatory Visit: Payer: Self-pay | Admitting: Internal Medicine

## 2021-05-23 DIAGNOSIS — R1310 Dysphagia, unspecified: Secondary | ICD-10-CM

## 2021-05-30 ENCOUNTER — Ambulatory Visit (HOSPITAL_COMMUNITY)
Admission: RE | Admit: 2021-05-30 | Discharge: 2021-05-30 | Disposition: A | Discharge status: Home / Self Care | Payer: PPO | Source: Ambulatory Visit | Attending: Internal Medicine | Admitting: Internal Medicine

## 2021-05-30 ENCOUNTER — Other Ambulatory Visit (HOSPITAL_COMMUNITY): Payer: Self-pay | Admitting: Internal Medicine

## 2021-05-30 ENCOUNTER — Other Ambulatory Visit: Payer: Self-pay

## 2021-05-30 DIAGNOSIS — R1310 Dysphagia, unspecified: Secondary | ICD-10-CM | POA: Diagnosis not present

## 2021-05-30 IMAGING — RF DG ESOPHAGUS
11 of 13 series · 14 of 24 positions shown · non-contrast
Comparison: NONE.

CLINICAL DATA: Dysphagia.

EXAM:
ESOPHAGUS/BARIUM SWALLOW/TABLET STUDY
TECHNIQUE: Single contrast examination was performed using thin liquid barium.
This exam was performed by APP name, and was supervised and
interpreted by Rad name.
FLUOROSCOPY TIME:  1 minute, 30 seconds

[Series 1: cp_standard · 0.27mm/px · 1 of 1 slices shown (1 of 10)]
[im 1/1]
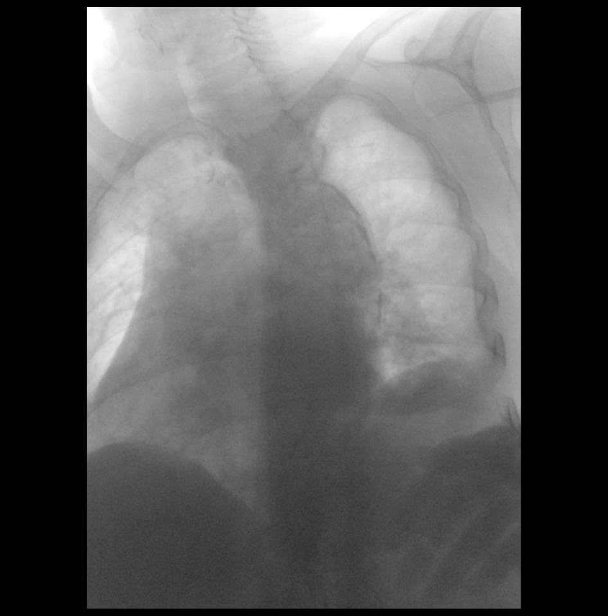

[Series 2: cp_standard · 0.27mm/px · 1 of 132 frames shown (2 of 10)]
[frame 31/132]
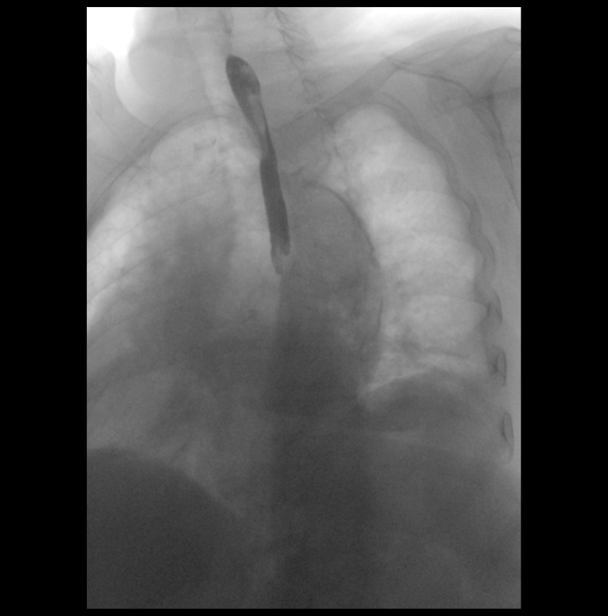

[Series 3: cp_standard · 0.27mm/px · 1 of 1 slices shown (3 of 10)]
[im 1/1]
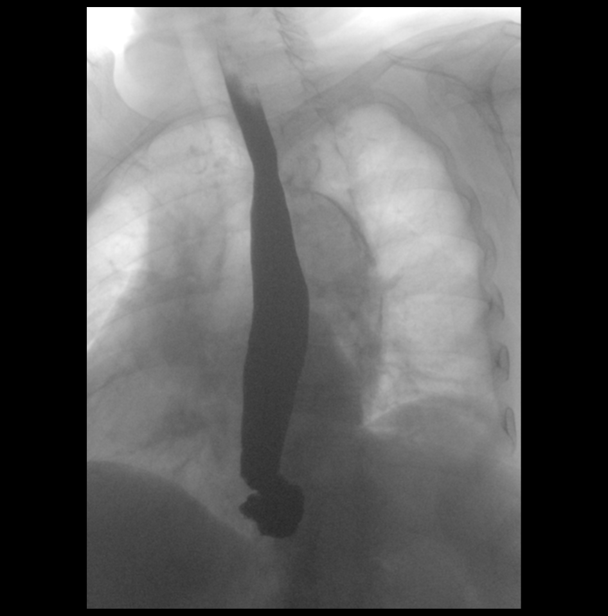

[Series 4: cp_standard · 0.27mm/px · 2 of 48 frames shown (4 of 10)]
[frame 8/48]
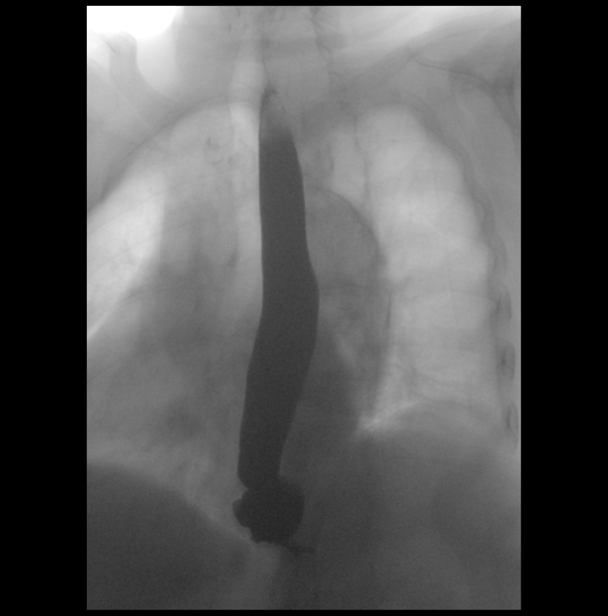
[frame 41/48]
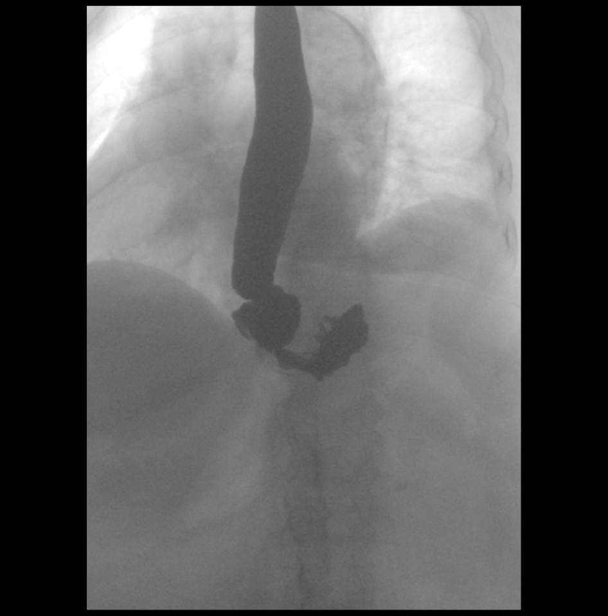

[Series 5: cp_standard · 0.27mm/px · 2 of 75 frames shown (5 of 10)]
[frame 38/75]
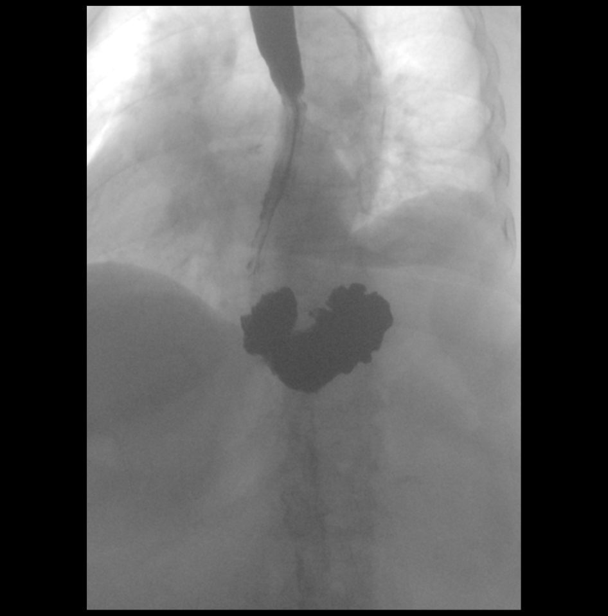
[frame 71/75]
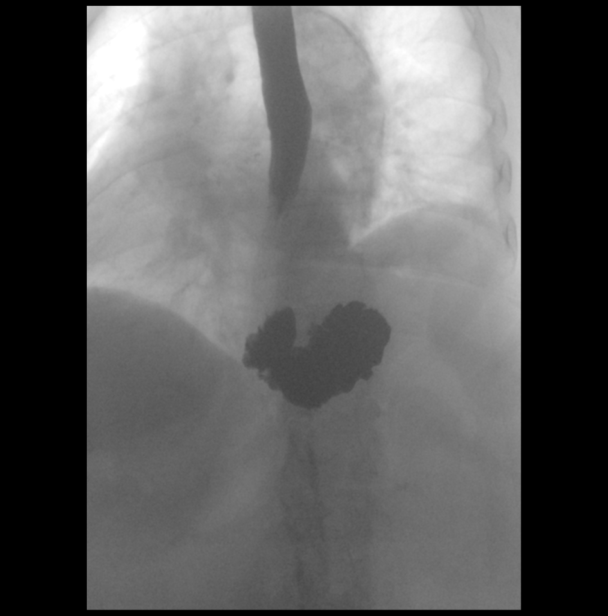

[Series 6: cp_standard · 0.27mm/px · 2 of 163 frames shown (6 of 10)]
[frame 52/163]
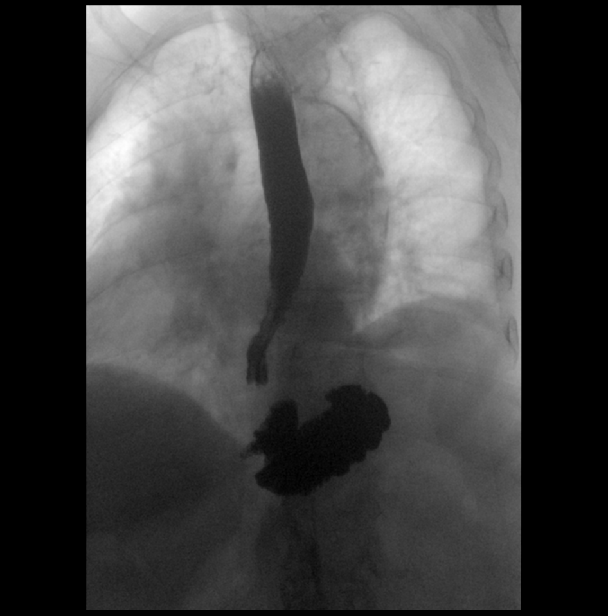
[frame 139/163]
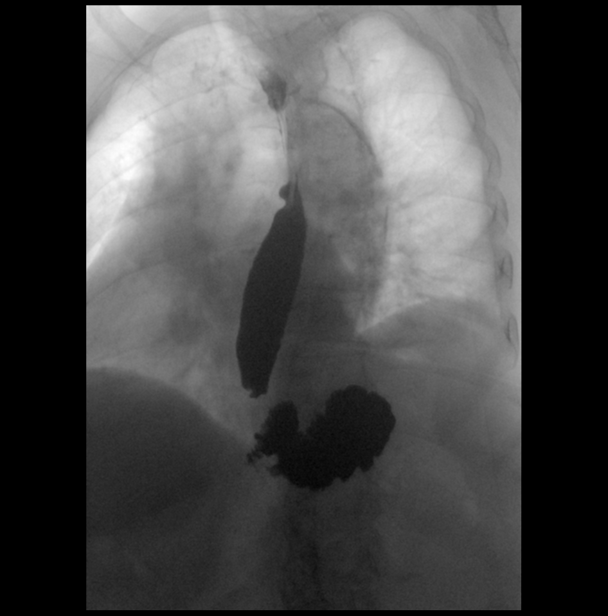

[Series 7: cp_standard · 0.26mm/px · 1 of 70 frames shown (7 of 10)]
[frame 31/70]
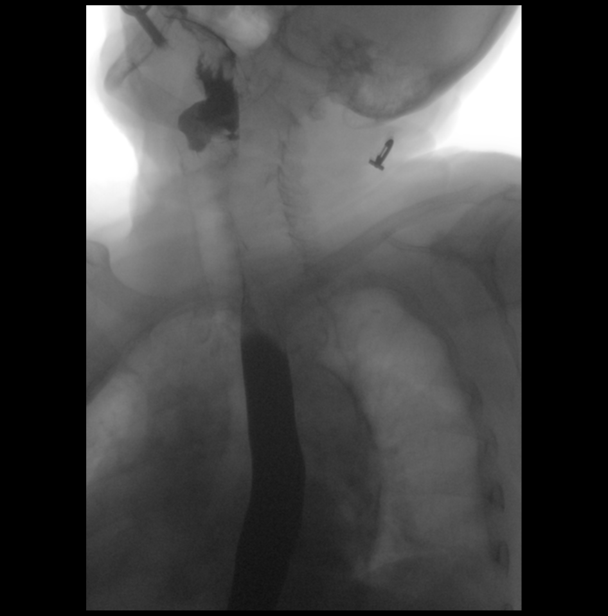

[Series 8: cp_standard · 0.27mm/px · 1 of 1 slices shown (8 of 10)]
[im 1/1]
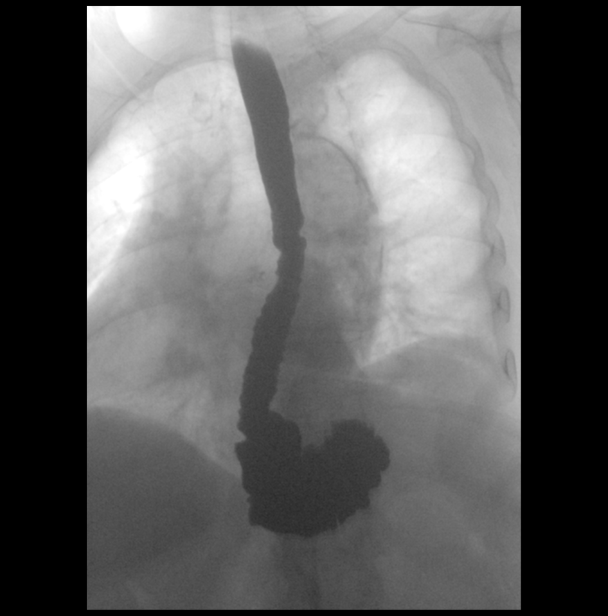

[Series 9: cp_standard · 0.18mm/px · 1 of 1 slices shown (9 of 10)]
[im 1/1]
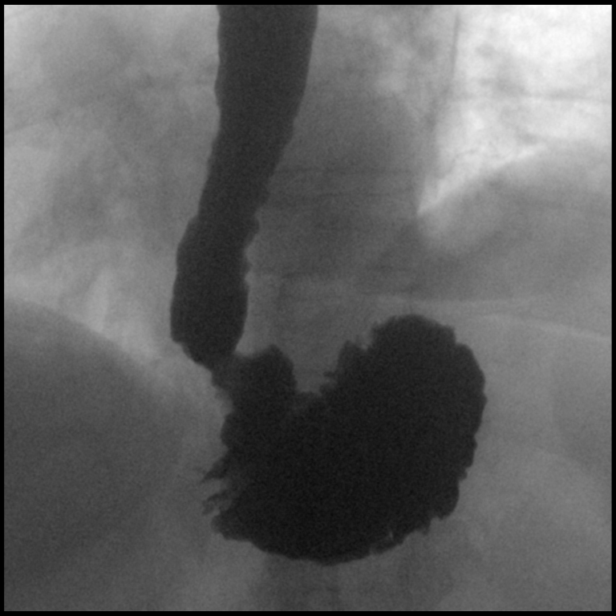

[Series 11: fluoro_barium 2fps_bw · 0.18mm/px · 1 of 2 frames shown]
[frame 2/2]
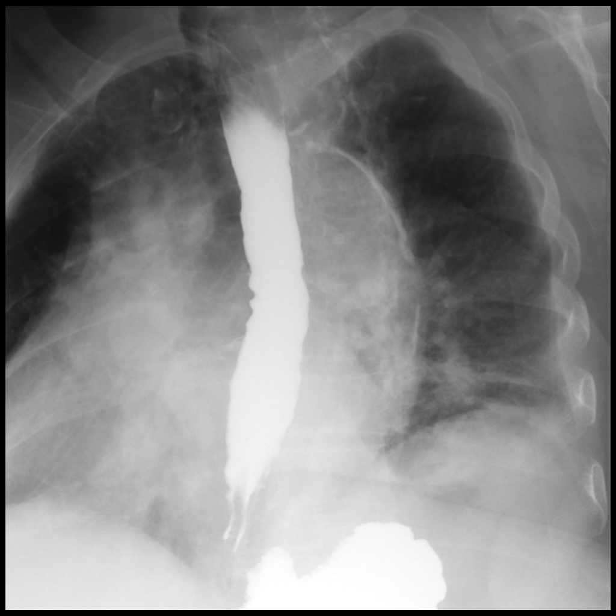

[Series 13: cp_standard · 0.18mm/px · 1 of 1 slices shown (10 of 10)]
[im 1/1]
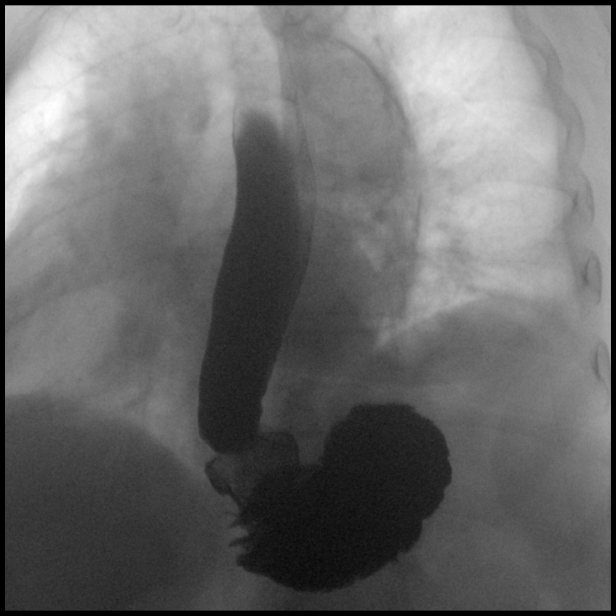

[14 of 24 positions shown; findings below may reference images not displayed]

FINDINGS: Examination is degraded secondary to patient's inability to stand
and/or significantly tolerate the examination.

Swallowing: Appears normal. No vestibular penetration or aspiration
seen.

Esophagus: The esophagus appears mildly dilated. No discrete areas
of mucosal irregularity. No definitive evidence of stenosis or
occlusion.

Esophageal motility: There is delayed esophageal motility with lack
of a stripping peristaltic wave and note made of several tertiary
contractions.

Hiatal Hernia: Note is made of a small hiatal hernia.

Gastroesophageal reflux: None visualized.

Ingested 13 mm barium tablet: Not given

Other: None.
IMPRESSION: Findings suggestive of esophageal dysmotility with lack of a
stripping peristaltic waves and a small hiatal hernia on this
limited examination.

## 2021-06-19 DIAGNOSIS — J329 Chronic sinusitis, unspecified: Secondary | ICD-10-CM | POA: Diagnosis not present

## 2021-06-19 DIAGNOSIS — I10 Essential (primary) hypertension: Secondary | ICD-10-CM | POA: Diagnosis not present

## 2021-06-19 DIAGNOSIS — Z6828 Body mass index (BMI) 28.0-28.9, adult: Secondary | ICD-10-CM | POA: Diagnosis not present

## 2021-06-19 DIAGNOSIS — E1140 Type 2 diabetes mellitus with diabetic neuropathy, unspecified: Secondary | ICD-10-CM | POA: Diagnosis not present

## 2021-06-19 DIAGNOSIS — J40 Bronchitis, not specified as acute or chronic: Secondary | ICD-10-CM | POA: Diagnosis not present

## 2021-06-19 DIAGNOSIS — E538 Deficiency of other specified B group vitamins: Secondary | ICD-10-CM | POA: Diagnosis not present

## 2021-06-24 ENCOUNTER — Telehealth: Payer: Self-pay | Admitting: Cardiology

## 2021-06-24 DIAGNOSIS — R404 Transient alteration of awareness: Secondary | ICD-10-CM | POA: Diagnosis not present

## 2021-06-24 NOTE — Telephone Encounter (Signed)
Patient calling in bout his mother. Didn't go into details ask the nurse to give a call back

## 2021-06-24 NOTE — Telephone Encounter (Signed)
Spoke with pt son, he wanted to let us know the patient passed away last night. Will make dr Stanford Breed aware. They do not need anything at this time.

## 2021-06-26 DIAGNOSIS — 419620001 Death: Secondary | SNOMED CT | POA: Diagnosis not present

## 2021-06-26 DEATH — deceased

## 2021-07-22 ENCOUNTER — Ambulatory Visit: Payer: PPO | Admitting: Cardiology
